# Patient Record
Sex: Male | Born: 1950
Health system: Southern US, Community
[De-identification: ages and names within clinical notes are randomized; demographics above are authoritative.]

## PROBLEM LIST (undated history)

## (undated) DIAGNOSIS — I499 Cardiac arrhythmia, unspecified: Secondary | ICD-10-CM

## (undated) DIAGNOSIS — E119 Type 2 diabetes mellitus without complications: Secondary | ICD-10-CM

## (undated) DIAGNOSIS — S91301A Unspecified open wound, right foot, initial encounter: Secondary | ICD-10-CM

## (undated) DIAGNOSIS — L02611 Cutaneous abscess of right foot: Secondary | ICD-10-CM

## (undated) DIAGNOSIS — I10 Essential (primary) hypertension: Secondary | ICD-10-CM

## (undated) HISTORY — DX: Cutaneous abscess of right foot: L02.611

## (undated) HISTORY — DX: Unspecified open wound, right foot, initial encounter: S91.301A

## (undated) HISTORY — PX: ROTATOR CUFF REPAIR: SHX139

---

## 2018-05-22 DIAGNOSIS — M79601 Pain in right arm: Secondary | ICD-10-CM

## 2018-05-22 HISTORY — DX: Pain in right arm: M79.601

## 2019-08-24 DIAGNOSIS — E119 Type 2 diabetes mellitus without complications: Secondary | ICD-10-CM

## 2019-08-24 DIAGNOSIS — I10 Essential (primary) hypertension: Secondary | ICD-10-CM

## 2019-08-24 DIAGNOSIS — I119 Hypertensive heart disease without heart failure: Secondary | ICD-10-CM | POA: Insufficient documentation

## 2019-08-24 DIAGNOSIS — I493 Ventricular premature depolarization: Secondary | ICD-10-CM

## 2019-08-24 HISTORY — DX: Hypertensive heart disease without heart failure: I11.9

## 2019-08-24 HISTORY — DX: Essential (primary) hypertension: I10

## 2019-08-24 HISTORY — DX: Type 2 diabetes mellitus without complications: E11.9

## 2019-08-24 HISTORY — DX: Ventricular premature depolarization: I49.3

## 2020-07-31 HISTORY — PX: FOOT SURGERY: SHX648

## 2020-08-07 ENCOUNTER — Telehealth: Payer: Self-pay

## 2020-08-07 ENCOUNTER — Other Ambulatory Visit: Payer: Self-pay

## 2020-08-07 ENCOUNTER — Emergency Department (INDEPENDENT_AMBULATORY_CARE_PROVIDER_SITE_OTHER): Payer: No Typology Code available for payment source

## 2020-08-07 ENCOUNTER — Emergency Department (INDEPENDENT_AMBULATORY_CARE_PROVIDER_SITE_OTHER)
Admission: RE | Admit: 2020-08-07 | Discharge: 2020-08-07 | Disposition: A | Discharge status: Home / Self Care | Payer: No Typology Code available for payment source | Source: Ambulatory Visit | Attending: Family Medicine | Admitting: Family Medicine

## 2020-08-07 VITALS — BP 121/76 | Temp 98.2°F | Resp 18

## 2020-08-07 DIAGNOSIS — S93134A Subluxation of interphalangeal joint of right lesser toe(s), initial encounter: Secondary | ICD-10-CM

## 2020-08-07 DIAGNOSIS — L03115 Cellulitis of right lower limb: Secondary | ICD-10-CM

## 2020-08-07 DIAGNOSIS — R2241 Localized swelling, mass and lump, right lower limb: Secondary | ICD-10-CM | POA: Diagnosis not present

## 2020-08-07 NOTE — ED Provider Notes (Signed)
[facility]    CSN: [id number] Arrival date & time: 08/07/20  0906      History   Chief Complaint Chief Complaint  Patient presents with  . Appointment    9am  . Foot Pain    RT    HPI Dean Rasmussen is a 69 y.o. male.   Patient complains of a long history of a subcutaneous nodule on the medial plantar surface of his right foot.  The lesion has gradually increased in size and become more painful during the past five months.  During the past 2 months he has had malodorous weeping from the surface of the lesion.  He keeps the lesion bandaged with application of bacitracin ointment.  He has recently moved to the area from [place].  He complains of increased pain as a result of moving boxes and furniture. He denies fevers, chills, and sweats.  He denies pain/swelling ascending to his right calf.  He has diabetes but is not sure of his most recent Hgb A1C.  He states that he is finishing a 3 week course of tapering dexamethasone for lower back pain.  The history is provided by the patient.  Foot Pain This is a chronic problem. Episode onset: Worse for five months. The problem occurs constantly. The problem has been gradually worsening. Pertinent negatives include no chest pain and no shortness of breath. The symptoms are aggravated by walking and standing. Nothing relieves the symptoms. Treatments tried: Application of bacitracin. The treatment provided no relief.    History reviewed. No pertinent past medical history.  Current problems:   Problem Noted Date  Essential hypertension 08/24/2019  Type 2 diabetes mellitus without complication, without long-term current use of insulin 08/24/2019  Ventricular ectopy 08/24/2019  Hypertensive left ventricular hypertrophy, without heart failure         History reviewed. No pertinent surgical history.     Home Medications    Prior to Admission medications   Medication Sig Start Date End Date Taking? Authorizing  Provider  glimepiride (AMARYL) 4 MG tablet Take by mouth. 10/14/17  Yes [provider]  levothyroxine (SYNTHROID) 25 MCG tablet Take by mouth. 10/10/17  Yes [provider]  metoprolol succinate (TOPROL-XL) 100 MG 24 hr tablet Take by mouth. 07/23/17  Yes [provider]  sitaGLIPtin (JANUVIA) 100 MG tablet Take by mouth. 10/04/17  Yes [provider]  aspirin 81 MG EC tablet Take by mouth.    [provider]    Family History Medical History Relation Name Comments  No Known Problems Father    Coronary artery disease Mother  stent  Coronary artery disease Sister  stent  Heart attack Sister     Relation Name Status Comments  Brother  Alive   Father  Deceased   Mother  Deceased   Sister        Social History Tobacco Use Types Packs/Day Years Used Date  Never Smoker      Smokeless Tobacco: Never Used      Alcohol Use Standard Drinks/Week Comments  Yes 0 (1 standard drink = 0.6 oz pure alcohol)        Allergies   Patient has no known allergies.   Review of Systems Review of Systems  Constitutional: Negative for activity change, appetite change, chills, diaphoresis, fatigue and fever.  HENT: Negative.   Eyes: Negative.   Respiratory: Negative for shortness of breath.   Cardiovascular: Negative for chest pain.  Gastrointestinal: Negative.   Genitourinary: Negative.  Musculoskeletal:       Right foot pain/swelling.  Skin: Positive for color change.  Neurological: Negative.   Hematological: Negative for adenopathy.     Physical Exam Triage Vital Signs ED Triage Vitals  Enc Vitals Group     BP 08/07/20 0918 121/76     Pulse --      Resp 08/07/20 0918 18     Temp 08/07/20 0918 98.2 F (36.8 C)     Temp Source 08/07/20 0918 Oral     SpO2 --      Weight --      Height --      Head Circumference --      Peak Flow --      Pain Score 08/07/20 0922 8     Pain Loc --      Pain Edu? --       Excl. in GC? --    No data found.  Updated Vital Signs BP 121/76 (BP Location: Right Arm)   Temp 98.2 F (36.8 C) (Oral)   Resp 18   Visual Acuity Right Eye Distance:   Left Eye Distance:   Bilateral Distance:    Right Eye Near:   Left Eye Near:    Bilateral Near:     Physical Exam Vitals and nursing note reviewed.  Constitutional:      General: He is not in acute distress. HENT:     Head: Normocephalic.     Mouth/Throat:     Mouth: Mucous membranes are moist.  Eyes:     Conjunctiva/sclera: Conjunctivae normal.     Pupils: Pupils are equal, round, and reactive to light.  Cardiovascular:     Rate and Rhythm: Normal rate and regular rhythm.     Heart sounds: Normal heart sounds.  Pulmonary:     Breath sounds: Normal breath sounds.  Abdominal:     Palpations: Abdomen is soft.     Tenderness: There is no abdominal tenderness.  Musculoskeletal:     Cervical back: Neck supple.     Right lower leg: No edema.     Left lower leg: No edema.       Feet:     Comments: Right medial foot, including plantar surface, has a large, firm mass, tender to palpation.  There is superficial weeping from excoriation on the plantar aspect of lesion.  There is erythema surrounding the lesion with tenderness to palpation but no induration or fluctuance. Cap refill is adequate.  Lymphadenopathy:     Cervical: No cervical adenopathy.  Skin:    General: Skin is warm and dry.  Neurological:     Mental Status: He is alert.      UC Treatments / Results  Labs (all labs ordered are listed, but only abnormal results are displayed) Labs Reviewed  WOUND CULTURE    EKG   Radiology DG Foot Complete Right  Result Date: 08/07/2020 CLINICAL DATA:  Chronic inflamed area along the medial foot with opened wound for 3 months, history of diabetes. EXAM: RIGHT FOOT COMPLETE - 3+ VIEW COMPARISON:  None FINDINGS: Extensive midfoot degenerative changes. Soft tissue swelling overlying the first  tarsometatarsal joint and medial cuneiform. Gas in the soft tissues. Marked irregularity of the base of the first metatarsal and medial cuneiform with shift of the first metatarsal laterally. Irregularity of intermediate and lateral cuneiform as well with some signs of sclerosis. Marked osteoarthritic change within the first MTP. Subluxation of the distal phalanx of the second toe. No sign of  acute fracture. IMPRESSION: 1. Soft tissue swelling and subcutaneous gas, compatible with soft tissue infection, potentially aggressive and likely related to reported ulceration. MRI may be helpful for further evaluation. 2. Midfoot degenerative changes with an appearance that likely is related to neuropathic process, presence of osteomyelitis particularly about the first metatarsal phalangeal joint is considered. 3. Subluxation of the second digit, distal phalanx may be chronic. Correlate with any acute pain in this area. These results will be called to the ordering clinician or representative by the Radiologist Assistant, and communication documented in the PACS or [employer]. Electronically Signed   By: Donzetta Kohut M.D.   On: 08/07/2020 10:51    Procedures Procedures (including critical care time)  Medications Ordered in UC Medications - No data to display  Initial Impression / Assessment and Plan / UC Course  I have reviewed the triage vital signs and the nursing notes.  Pertinent labs & imaging results that were available during my care of the patient were reviewed by me and considered in my medical decision making (see chart for details).    Concern for possible osteomyelitis in a chronically inflamed mass of patient's right foot plantar surface.  Will refer to podiatrist Dr. Ovid Curd for definitive management.   Final Clinical Impressions(s) / UC Diagnoses   Final diagnoses:  Mass of right foot  Cellulitis of right foot   Discharge Instructions   None    ED Prescriptions     None        Lattie Haw, MD 08/08/20 1525

## 2020-08-07 NOTE — ED Triage Notes (Signed)
Pt c/o foot pain x 2 mos. Wound in arch area that has started peeling and having a strong odor. Thought it was a plantars wart, size of half dollar. Bacitracin prn.

## 2020-08-07 NOTE — Telephone Encounter (Signed)
Was calling about referral to Dr Ardelle Anton. Advised pt that his info and imaging report has been faxed to Dr Ardelle Anton ofc and he should receive a call. Advised also trying to call his ofc to possibly schedule an appt asap.

## 2020-08-08 ENCOUNTER — Other Ambulatory Visit: Payer: Self-pay

## 2020-08-08 ENCOUNTER — Encounter (HOSPITAL_COMMUNITY): Payer: Self-pay | Admitting: Emergency Medicine

## 2020-08-08 ENCOUNTER — Inpatient Hospital Stay (HOSPITAL_COMMUNITY)
Admission: EM | Admit: 2020-08-08 | Discharge: 2020-08-16 | DRG: 628 | Disposition: A | Discharge status: Home / Self Care | Payer: No Typology Code available for payment source | Attending: Internal Medicine | Admitting: Internal Medicine

## 2020-08-08 ENCOUNTER — Ambulatory Visit (INDEPENDENT_AMBULATORY_CARE_PROVIDER_SITE_OTHER): Payer: No Typology Code available for payment source | Admitting: Podiatry

## 2020-08-08 DIAGNOSIS — Z9889 Other specified postprocedural states: Secondary | ICD-10-CM

## 2020-08-08 DIAGNOSIS — M869 Osteomyelitis, unspecified: Secondary | ICD-10-CM | POA: Diagnosis not present

## 2020-08-08 DIAGNOSIS — M1460 Charcot's joint, unspecified site: Secondary | ICD-10-CM | POA: Diagnosis present

## 2020-08-08 DIAGNOSIS — S91301A Unspecified open wound, right foot, initial encounter: Secondary | ICD-10-CM | POA: Diagnosis not present

## 2020-08-08 DIAGNOSIS — Z20822 Contact with and (suspected) exposure to covid-19: Secondary | ICD-10-CM | POA: Diagnosis present

## 2020-08-08 DIAGNOSIS — E1142 Type 2 diabetes mellitus with diabetic polyneuropathy: Secondary | ICD-10-CM | POA: Diagnosis present

## 2020-08-08 DIAGNOSIS — I119 Hypertensive heart disease without heart failure: Secondary | ICD-10-CM | POA: Diagnosis present

## 2020-08-08 DIAGNOSIS — Z7982 Long term (current) use of aspirin: Secondary | ICD-10-CM

## 2020-08-08 DIAGNOSIS — Z79890 Hormone replacement therapy (postmenopausal): Secondary | ICD-10-CM

## 2020-08-08 DIAGNOSIS — E11610 Type 2 diabetes mellitus with diabetic neuropathic arthropathy: Secondary | ICD-10-CM | POA: Diagnosis present

## 2020-08-08 DIAGNOSIS — L02619 Cutaneous abscess of unspecified foot: Secondary | ICD-10-CM | POA: Diagnosis not present

## 2020-08-08 DIAGNOSIS — Z79899 Other long term (current) drug therapy: Secondary | ICD-10-CM

## 2020-08-08 DIAGNOSIS — I4891 Unspecified atrial fibrillation: Secondary | ICD-10-CM | POA: Diagnosis not present

## 2020-08-08 DIAGNOSIS — I493 Ventricular premature depolarization: Secondary | ICD-10-CM | POA: Diagnosis present

## 2020-08-08 DIAGNOSIS — L03119 Cellulitis of unspecified part of limb: Secondary | ICD-10-CM

## 2020-08-08 DIAGNOSIS — E1140 Type 2 diabetes mellitus with diabetic neuropathy, unspecified: Secondary | ICD-10-CM | POA: Diagnosis present

## 2020-08-08 DIAGNOSIS — A480 Gas gangrene: Secondary | ICD-10-CM | POA: Diagnosis present

## 2020-08-08 DIAGNOSIS — Z452 Encounter for adjustment and management of vascular access device: Secondary | ICD-10-CM

## 2020-08-08 DIAGNOSIS — I10 Essential (primary) hypertension: Secondary | ICD-10-CM

## 2020-08-08 DIAGNOSIS — K5900 Constipation, unspecified: Secondary | ICD-10-CM | POA: Diagnosis not present

## 2020-08-08 DIAGNOSIS — M00071 Staphylococcal arthritis, right ankle and foot: Secondary | ICD-10-CM | POA: Diagnosis present

## 2020-08-08 DIAGNOSIS — L97519 Non-pressure chronic ulcer of other part of right foot with unspecified severity: Secondary | ICD-10-CM | POA: Diagnosis present

## 2020-08-08 DIAGNOSIS — D649 Anemia, unspecified: Secondary | ICD-10-CM | POA: Diagnosis present

## 2020-08-08 DIAGNOSIS — E11621 Type 2 diabetes mellitus with foot ulcer: Secondary | ICD-10-CM | POA: Diagnosis present

## 2020-08-08 DIAGNOSIS — E039 Hypothyroidism, unspecified: Secondary | ICD-10-CM | POA: Diagnosis present

## 2020-08-08 DIAGNOSIS — I4819 Other persistent atrial fibrillation: Secondary | ICD-10-CM | POA: Diagnosis not present

## 2020-08-08 DIAGNOSIS — E11628 Type 2 diabetes mellitus with other skin complications: Secondary | ICD-10-CM | POA: Diagnosis not present

## 2020-08-08 DIAGNOSIS — L97513 Non-pressure chronic ulcer of other part of right foot with necrosis of muscle: Secondary | ICD-10-CM | POA: Diagnosis not present

## 2020-08-08 DIAGNOSIS — E1152 Type 2 diabetes mellitus with diabetic peripheral angiopathy with gangrene: Secondary | ICD-10-CM | POA: Diagnosis present

## 2020-08-08 DIAGNOSIS — L02611 Cutaneous abscess of right foot: Secondary | ICD-10-CM | POA: Diagnosis present

## 2020-08-08 DIAGNOSIS — E1165 Type 2 diabetes mellitus with hyperglycemia: Secondary | ICD-10-CM | POA: Diagnosis present

## 2020-08-08 DIAGNOSIS — E119 Type 2 diabetes mellitus without complications: Secondary | ICD-10-CM

## 2020-08-08 DIAGNOSIS — B9561 Methicillin susceptible Staphylococcus aureus infection as the cause of diseases classified elsewhere: Secondary | ICD-10-CM | POA: Diagnosis present

## 2020-08-08 DIAGNOSIS — Z7984 Long term (current) use of oral hypoglycemic drugs: Secondary | ICD-10-CM

## 2020-08-08 DIAGNOSIS — E1149 Type 2 diabetes mellitus with other diabetic neurological complication: Secondary | ICD-10-CM

## 2020-08-08 HISTORY — DX: Essential (primary) hypertension: I10

## 2020-08-08 HISTORY — DX: Type 2 diabetes mellitus without complications: E11.9

## 2020-08-08 LAB — COMPREHENSIVE METABOLIC PANEL
ALT: 24 U/L (ref 0–44)
AST: 17 U/L (ref 15–41)
Albumin: 3.3 g/dL — ABNORMAL LOW (ref 3.5–5.0)
Alkaline Phosphatase: 58 U/L (ref 38–126)
Anion gap: 9 (ref 5–15)
BUN: 32 mg/dL — ABNORMAL HIGH (ref 8–23)
CO2: 26 mmol/L (ref 22–32)
Calcium: 8.9 mg/dL (ref 8.9–10.3)
Chloride: 101 mmol/L (ref 98–111)
Creatinine, Ser: 1.12 mg/dL (ref 0.61–1.24)
GFR calc Af Amer: >60 mL/min (ref >60)
GFR calc non Af Amer: >60 mL/min (ref >60)
Glucose, Bld: 301 mg/dL — ABNORMAL HIGH (ref 70–99)
Potassium: 4 mmol/L (ref 3.5–5.1)
Sodium: 136 mmol/L (ref 135–145)
Total Bilirubin: 0.9 mg/dL (ref 0.3–1.2)
Total Protein: 6.5 g/dL (ref 6.5–8.1)

## 2020-08-08 LAB — CBC WITH DIFFERENTIAL/PLATELET
Abs Immature Granulocytes: 0.19 10*3/uL — ABNORMAL HIGH (ref 0.00–0.07)
Basophils Absolute: 0 10*3/uL (ref 0.0–0.1)
Basophils Relative: 0 %
Eosinophils Absolute: 0 10*3/uL (ref 0.0–0.5)
Eosinophils Relative: 0 %
HCT: 36.8 % — ABNORMAL LOW (ref 39.0–52.0)
Hemoglobin: 12 g/dL — ABNORMAL LOW (ref 13.0–17.0)
Immature Granulocytes: 1 %
Lymphocytes Relative: 2 %
Lymphs Abs: 0.4 10*3/uL — ABNORMAL LOW (ref 0.7–4.0)
MCH: 30.8 pg (ref 26.0–34.0)
MCHC: 32.6 g/dL (ref 30.0–36.0)
MCV: 94.4 fL (ref 80.0–100.0)
Monocytes Absolute: 0.9 10*3/uL (ref 0.1–1.0)
Monocytes Relative: 4 %
Neutro Abs: 19.9 10*3/uL — ABNORMAL HIGH (ref 1.7–7.7)
Neutrophils Relative %: 93 %
Platelets: 240 10*3/uL (ref 150–400)
RBC: 3.9 MIL/uL — ABNORMAL LOW (ref 4.22–5.81)
RDW: 12.8 % (ref 11.5–15.5)
WBC: 21.4 10*3/uL — ABNORMAL HIGH (ref 4.0–10.5)
nRBC: 0 % (ref 0.0–0.2)

## 2020-08-08 LAB — LACTIC ACID, PLASMA: Lactic Acid, Venous: 1.2 mmol/L (ref 0.5–1.9)

## 2020-08-08 LAB — PROTIME-INR
INR: 1 (ref 0.8–1.2)
Prothrombin Time: 12.4 seconds (ref 11.4–15.2)

## 2020-08-08 NOTE — ED Triage Notes (Signed)
Pt reports he has a diabetic ulcer to his right foot x 2-3 mos. Seen yesterday at [facility] for same. Reports pain, swelling to the right foot and foul odor. Wound dressed prior to arrival.

## 2020-08-09 ENCOUNTER — Inpatient Hospital Stay (HOSPITAL_COMMUNITY): Payer: No Typology Code available for payment source

## 2020-08-09 ENCOUNTER — Encounter (HOSPITAL_COMMUNITY): Payer: Self-pay | Admitting: Internal Medicine

## 2020-08-09 ENCOUNTER — Inpatient Hospital Stay (HOSPITAL_COMMUNITY): Payer: No Typology Code available for payment source | Admitting: Certified Registered"

## 2020-08-09 ENCOUNTER — Emergency Department (HOSPITAL_COMMUNITY): Payer: No Typology Code available for payment source

## 2020-08-09 ENCOUNTER — Encounter (HOSPITAL_COMMUNITY)
Admission: EM | Disposition: A | Discharge status: Home / Self Care | Payer: Self-pay | Source: Home / Self Care | Attending: Internal Medicine

## 2020-08-09 DIAGNOSIS — Z9889 Other specified postprocedural states: Secondary | ICD-10-CM | POA: Diagnosis not present

## 2020-08-09 DIAGNOSIS — B9561 Methicillin susceptible Staphylococcus aureus infection as the cause of diseases classified elsewhere: Secondary | ICD-10-CM | POA: Diagnosis present

## 2020-08-09 DIAGNOSIS — A480 Gas gangrene: Secondary | ICD-10-CM | POA: Diagnosis present

## 2020-08-09 DIAGNOSIS — E119 Type 2 diabetes mellitus without complications: Secondary | ICD-10-CM | POA: Diagnosis not present

## 2020-08-09 DIAGNOSIS — M869 Osteomyelitis, unspecified: Secondary | ICD-10-CM | POA: Diagnosis present

## 2020-08-09 DIAGNOSIS — E11621 Type 2 diabetes mellitus with foot ulcer: Secondary | ICD-10-CM | POA: Diagnosis present

## 2020-08-09 DIAGNOSIS — E11610 Type 2 diabetes mellitus with diabetic neuropathic arthropathy: Secondary | ICD-10-CM | POA: Diagnosis present

## 2020-08-09 DIAGNOSIS — L02611 Cutaneous abscess of right foot: Secondary | ICD-10-CM | POA: Diagnosis present

## 2020-08-09 DIAGNOSIS — Z79899 Other long term (current) drug therapy: Secondary | ICD-10-CM | POA: Diagnosis not present

## 2020-08-09 DIAGNOSIS — M86171 Other acute osteomyelitis, right ankle and foot: Secondary | ICD-10-CM | POA: Diagnosis not present

## 2020-08-09 DIAGNOSIS — M00071 Staphylococcal arthritis, right ankle and foot: Secondary | ICD-10-CM | POA: Diagnosis present

## 2020-08-09 DIAGNOSIS — E1152 Type 2 diabetes mellitus with diabetic peripheral angiopathy with gangrene: Secondary | ICD-10-CM | POA: Diagnosis present

## 2020-08-09 DIAGNOSIS — L97519 Non-pressure chronic ulcer of other part of right foot with unspecified severity: Secondary | ICD-10-CM | POA: Diagnosis present

## 2020-08-09 DIAGNOSIS — S91301S Unspecified open wound, right foot, sequela: Secondary | ICD-10-CM | POA: Diagnosis not present

## 2020-08-09 DIAGNOSIS — E1165 Type 2 diabetes mellitus with hyperglycemia: Secondary | ICD-10-CM | POA: Diagnosis present

## 2020-08-09 DIAGNOSIS — I10 Essential (primary) hypertension: Secondary | ICD-10-CM | POA: Diagnosis not present

## 2020-08-09 DIAGNOSIS — Z79890 Hormone replacement therapy (postmenopausal): Secondary | ICD-10-CM | POA: Diagnosis not present

## 2020-08-09 DIAGNOSIS — K5900 Constipation, unspecified: Secondary | ICD-10-CM | POA: Diagnosis not present

## 2020-08-09 DIAGNOSIS — I4819 Other persistent atrial fibrillation: Secondary | ICD-10-CM | POA: Diagnosis not present

## 2020-08-09 DIAGNOSIS — Z7984 Long term (current) use of oral hypoglycemic drugs: Secondary | ICD-10-CM | POA: Diagnosis not present

## 2020-08-09 DIAGNOSIS — E039 Hypothyroidism, unspecified: Secondary | ICD-10-CM | POA: Diagnosis present

## 2020-08-09 DIAGNOSIS — I4891 Unspecified atrial fibrillation: Secondary | ICD-10-CM | POA: Diagnosis not present

## 2020-08-09 DIAGNOSIS — Z7982 Long term (current) use of aspirin: Secondary | ICD-10-CM | POA: Diagnosis not present

## 2020-08-09 DIAGNOSIS — I119 Hypertensive heart disease without heart failure: Secondary | ICD-10-CM | POA: Diagnosis present

## 2020-08-09 DIAGNOSIS — D649 Anemia, unspecified: Secondary | ICD-10-CM | POA: Diagnosis present

## 2020-08-09 DIAGNOSIS — E1142 Type 2 diabetes mellitus with diabetic polyneuropathy: Secondary | ICD-10-CM | POA: Diagnosis present

## 2020-08-09 DIAGNOSIS — Z20822 Contact with and (suspected) exposure to covid-19: Secondary | ICD-10-CM | POA: Diagnosis present

## 2020-08-09 DIAGNOSIS — E11628 Type 2 diabetes mellitus with other skin complications: Secondary | ICD-10-CM | POA: Diagnosis present

## 2020-08-09 HISTORY — PX: I & D EXTREMITY: SHX5045

## 2020-08-09 HISTORY — PX: IRRIGATION AND DEBRIDEMENT FOOT: SHX6602

## 2020-08-09 LAB — CBC
HCT: 42.2 % (ref 39.0–52.0)
HCT: 36.9 % — ABNORMAL LOW (ref 39.0–52.0)
Hemoglobin: 13.7 g/dL (ref 13.0–17.0)
Hemoglobin: 12.1 g/dL — ABNORMAL LOW (ref 13.0–17.0)
MCH: 31.1 pg (ref 26.0–34.0)
MCH: 30.9 pg (ref 26.0–34.0)
MCHC: 32.5 g/dL (ref 30.0–36.0)
MCHC: 32.8 g/dL (ref 30.0–36.0)
MCV: 95.9 fL (ref 80.0–100.0)
MCV: 94.4 fL (ref 80.0–100.0)
Platelets: 241 10*3/uL (ref 150–400)
Platelets: 211 10*3/uL (ref 150–400)
RBC: 4.4 MIL/uL (ref 4.22–5.81)
RBC: 3.91 MIL/uL — ABNORMAL LOW (ref 4.22–5.81)
RDW: 13 % (ref 11.5–15.5)
RDW: 12.9 % (ref 11.5–15.5)
WBC: 23.1 10*3/uL — ABNORMAL HIGH (ref 4.0–10.5)
WBC: 19.5 10*3/uL — ABNORMAL HIGH (ref 4.0–10.5)
nRBC: 0 % (ref 0.0–0.2)
nRBC: 0 % (ref 0.0–0.2)

## 2020-08-09 LAB — CBG MONITORING, ED
Glucose-Capillary: 254 mg/dL — ABNORMAL HIGH (ref 70–99)
Glucose-Capillary: 214 mg/dL — ABNORMAL HIGH (ref 70–99)

## 2020-08-09 LAB — SEDIMENTATION RATE: Sed Rate: 54 mm/hr — ABNORMAL HIGH (ref 0–16)

## 2020-08-09 LAB — HIV ANTIBODY (ROUTINE TESTING W REFLEX): HIV Screen 4th Generation wRfx: NONREACTIVE

## 2020-08-09 LAB — PROTIME-INR
INR: 1 (ref 0.8–1.2)
Prothrombin Time: 13.1 seconds (ref 11.4–15.2)

## 2020-08-09 LAB — GLUCOSE, CAPILLARY: Glucose-Capillary: 199 mg/dL — ABNORMAL HIGH (ref 70–99)

## 2020-08-09 LAB — HEMOGLOBIN A1C
Hgb A1c MFr Bld: 9 % — ABNORMAL HIGH (ref 4.8–5.6)
Mean Plasma Glucose: 211.6 mg/dL

## 2020-08-09 LAB — CREATININE, SERUM
Creatinine, Ser: 1.13 mg/dL (ref 0.61–1.24)
GFR calc Af Amer: >60 mL/min (ref >60)
GFR calc non Af Amer: >60 mL/min (ref >60)

## 2020-08-09 LAB — SARS CORONAVIRUS 2 BY RT PCR (HOSPITAL ORDER, PERFORMED IN ~~LOC~~ HOSPITAL LAB): SARS Coronavirus 2: NEGATIVE

## 2020-08-09 LAB — TSH: TSH: 0.892 u[IU]/mL (ref 0.350–4.500)

## 2020-08-09 SURGERY — IRRIGATION AND DEBRIDEMENT EXTREMITY
Anesthesia: Monitor Anesthesia Care | Site: Foot | Laterality: Right

## 2020-08-09 MED ORDER — ACETAMINOPHEN 160 MG/5ML PO SOLN: 325.0000 mg | Freq: Once | ORAL | Status: DC | PRN

## 2020-08-09 MED ORDER — LEVOTHYROXINE SODIUM 25 MCG PO TABS
25.0000 ug | ORAL_TABLET | Freq: Every day | ORAL | Status: DC
  Administered 2020-08-10 – 2020-08-16 (×7): 25 ug via ORAL
  Filled 2020-08-09 (×7): qty 1

## 2020-08-09 MED ORDER — CHLORHEXIDINE GLUCONATE 0.12 % MT SOLN
15.0000 mL | OROMUCOSAL | Status: AC
  Filled 2020-08-09: qty 15

## 2020-08-09 MED ORDER — ACETAMINOPHEN 650 MG RE SUPP: 650.0000 mg | Freq: Four times a day (QID) | RECTAL | Status: DC | PRN

## 2020-08-09 MED ORDER — MIDAZOLAM HCL 5 MG/5ML IJ SOLN
INTRAMUSCULAR | Status: DC | PRN
  Administered 2020-08-09: 2 mg via INTRAVENOUS

## 2020-08-09 MED ORDER — PIPERACILLIN-TAZOBACTAM 3.375 G IVPB
3.3750 g | Freq: Three times a day (TID) | INTRAVENOUS | Status: DC
  Administered 2020-08-10 – 2020-08-12 (×7): 3.375 g via INTRAVENOUS
  Filled 2020-08-09 (×5): qty 50

## 2020-08-09 MED ORDER — PROPOFOL 500 MG/50ML IV EMUL
INTRAVENOUS | Status: DC | PRN
  Administered 2020-08-09: 75 ug/kg/min via INTRAVENOUS

## 2020-08-09 MED ORDER — MEPERIDINE HCL 25 MG/ML IJ SOLN: 6.2500 mg | INTRAMUSCULAR | Status: DC | PRN

## 2020-08-09 MED ORDER — VANCOMYCIN HCL IN DEXTROSE 1-5 GM/200ML-% IV SOLN
1000.0000 mg | Freq: Two times a day (BID) | INTRAVENOUS | Status: DC
  Administered 2020-08-10 – 2020-08-13 (×7): 1000 mg via INTRAVENOUS
  Filled 2020-08-09 (×9): qty 200

## 2020-08-09 MED ORDER — ACETAMINOPHEN 10 MG/ML IV SOLN
1000.0000 mg | Freq: Once | INTRAVENOUS | Status: DC | PRN
  Administered 2020-08-09: 1000 mg via INTRAVENOUS

## 2020-08-09 MED ORDER — SODIUM CHLORIDE 0.9 % IV SOLN
2.0000 g | Freq: Once | INTRAVENOUS | Status: AC
  Administered 2020-08-09: 2 g via INTRAVENOUS
  Filled 2020-08-09: qty 20

## 2020-08-09 MED ORDER — BUPIVACAINE HCL (PF) 0.5 % IJ SOLN
INTRAMUSCULAR | Status: DC | PRN
  Administered 2020-08-09: 10 mL

## 2020-08-09 MED ORDER — LACTATED RINGERS IV SOLN: INTRAVENOUS | Status: DC

## 2020-08-09 MED ORDER — HYDROMORPHONE HCL 1 MG/ML IJ SOLN
0.2500 mg | INTRAMUSCULAR | Status: DC | PRN
  Administered 2020-08-09 (×2): 0.5 mg via INTRAVENOUS

## 2020-08-09 MED ORDER — ACETAMINOPHEN 325 MG PO TABS: 325.0000 mg | ORAL_TABLET | Freq: Once | ORAL | Status: DC | PRN

## 2020-08-09 MED ORDER — SODIUM CHLORIDE 0.9% FLUSH
3.0000 mL | Freq: Two times a day (BID) | INTRAVENOUS | Status: DC
  Administered 2020-08-09 – 2020-08-15 (×6): 3 mL via INTRAVENOUS

## 2020-08-09 MED ORDER — MORPHINE SULFATE (PF) 2 MG/ML IV SOLN: 2.0000 mg | INTRAVENOUS | Status: DC | PRN

## 2020-08-09 MED ORDER — MIDAZOLAM HCL 2 MG/2ML IJ SOLN
INTRAMUSCULAR | Status: AC
  Filled 2020-08-09: qty 2

## 2020-08-09 MED ORDER — ACETAMINOPHEN 10 MG/ML IV SOLN
INTRAVENOUS | Status: AC
  Filled 2020-08-09: qty 100

## 2020-08-09 MED ORDER — SODIUM CHLORIDE 0.45 % IV SOLN: INTRAVENOUS | Status: DC

## 2020-08-09 MED ORDER — AMISULPRIDE (ANTIEMETIC) 5 MG/2ML IV SOLN: 10.0000 mg | Freq: Once | INTRAVENOUS | Status: DC | PRN

## 2020-08-09 MED ORDER — INSULIN ASPART 100 UNIT/ML ~~LOC~~ SOLN
0.0000 [IU] | Freq: Three times a day (TID) | SUBCUTANEOUS | Status: DC
  Administered 2020-08-10 (×3): 5 [IU] via SUBCUTANEOUS
  Administered 2020-08-11: 3 [IU] via SUBCUTANEOUS
  Administered 2020-08-11: 5 [IU] via SUBCUTANEOUS
  Administered 2020-08-11: 8 [IU] via SUBCUTANEOUS
  Administered 2020-08-12: 3 [IU] via SUBCUTANEOUS
  Administered 2020-08-12 – 2020-08-13 (×2): 8 [IU] via SUBCUTANEOUS
  Administered 2020-08-13: 5 [IU] via SUBCUTANEOUS
  Administered 2020-08-13 – 2020-08-14 (×2): 3 [IU] via SUBCUTANEOUS
  Administered 2020-08-14: 11 [IU] via SUBCUTANEOUS
  Administered 2020-08-14: 5 [IU] via SUBCUTANEOUS
  Administered 2020-08-15: 2 [IU] via SUBCUTANEOUS
  Administered 2020-08-15: 3 [IU] via SUBCUTANEOUS
  Administered 2020-08-15: 2 [IU] via SUBCUTANEOUS
  Administered 2020-08-16: 8 [IU] via SUBCUTANEOUS

## 2020-08-09 MED ORDER — ONDANSETRON HCL 4 MG/2ML IJ SOLN
INTRAMUSCULAR | Status: AC
  Filled 2020-08-09: qty 2

## 2020-08-09 MED ORDER — SODIUM CHLORIDE 0.9% FLUSH: 3.0000 mL | INTRAVENOUS | Status: DC | PRN

## 2020-08-09 MED ORDER — SODIUM CHLORIDE 0.9 % IV SOLN: 2.0000 g | INTRAVENOUS | Status: DC

## 2020-08-09 MED ORDER — LIDOCAINE HCL 2 % IJ SOLN
INTRAMUSCULAR | Status: AC
  Filled 2020-08-09: qty 20

## 2020-08-09 MED ORDER — VANCOMYCIN HCL 1000 MG IV SOLR
INTRAVENOUS | Status: AC
  Filled 2020-08-09: qty 1000

## 2020-08-09 MED ORDER — ONDANSETRON HCL 4 MG/2ML IJ SOLN
INTRAMUSCULAR | Status: DC | PRN
  Administered 2020-08-09: 4 mg via INTRAVENOUS

## 2020-08-09 MED ORDER — METRONIDAZOLE IN NACL 5-0.79 MG/ML-% IV SOLN: 500.0000 mg | Freq: Three times a day (TID) | INTRAVENOUS | Status: DC

## 2020-08-09 MED ORDER — FENTANYL CITRATE (PF) 100 MCG/2ML IJ SOLN
INTRAMUSCULAR | Status: DC | PRN
Start: 2020-08-09 — End: 2020-08-09
  Administered 2020-08-09: 50 ug via INTRAVENOUS

## 2020-08-09 MED ORDER — BISACODYL 10 MG RE SUPP: 10.0000 mg | Freq: Every day | RECTAL | Status: DC | PRN

## 2020-08-09 MED ORDER — HYDROMORPHONE HCL 1 MG/ML IJ SOLN
INTRAMUSCULAR | Status: AC
  Filled 2020-08-09: qty 1

## 2020-08-09 MED ORDER — METRONIDAZOLE IN NACL 5-0.79 MG/ML-% IV SOLN
500.0000 mg | Freq: Once | INTRAVENOUS | Status: AC
  Administered 2020-08-09: 500 mg via INTRAVENOUS
  Filled 2020-08-09: qty 100

## 2020-08-09 MED ORDER — VANCOMYCIN HCL 1500 MG/300ML IV SOLN
1500.0000 mg | INTRAVENOUS | Status: AC
  Administered 2020-08-09: 1500 mg via INTRAVENOUS
  Filled 2020-08-09: qty 300

## 2020-08-09 MED ORDER — FENTANYL CITRATE (PF) 250 MCG/5ML IJ SOLN
INTRAMUSCULAR | Status: AC
  Filled 2020-08-09: qty 5

## 2020-08-09 MED ORDER — POLYETHYLENE GLYCOL 3350 17 G PO PACK: 17.0000 g | PACK | Freq: Every day | ORAL | Status: DC | PRN

## 2020-08-09 MED ORDER — HYDROCODONE-ACETAMINOPHEN 5-325 MG PO TABS
1.0000 | ORAL_TABLET | ORAL | Status: DC | PRN
  Administered 2020-08-10 – 2020-08-14 (×5): 2 via ORAL
  Administered 2020-08-15: 1 via ORAL
  Filled 2020-08-09 (×6): qty 2
  Filled 2020-08-09 (×2): qty 1

## 2020-08-09 MED ORDER — SODIUM CHLORIDE 0.9 % IV SOLN
250.0000 mL | INTRAVENOUS | Status: DC | PRN
  Administered 2020-08-09: 250 mL via INTRAVENOUS

## 2020-08-09 MED ORDER — PIPERACILLIN-TAZOBACTAM 3.375 G IVPB 30 MIN
3.3750 g | Freq: Once | INTRAVENOUS | Status: DC
  Filled 2020-08-09: qty 50

## 2020-08-09 MED ORDER — METOPROLOL SUCCINATE ER 50 MG PO TB24
100.0000 mg | ORAL_TABLET | Freq: Every day | ORAL | Status: DC
  Administered 2020-08-10 – 2020-08-16 (×7): 100 mg via ORAL
  Filled 2020-08-09 (×7): qty 2

## 2020-08-09 MED ORDER — ACETAMINOPHEN 325 MG PO TABS
650.0000 mg | ORAL_TABLET | Freq: Four times a day (QID) | ORAL | Status: DC | PRN
  Administered 2020-08-09 – 2020-08-16 (×4): 650 mg via ORAL
  Filled 2020-08-09 (×4): qty 2

## 2020-08-09 MED ORDER — IBUPROFEN 400 MG PO TABS: 600.0000 mg | ORAL_TABLET | Freq: Four times a day (QID) | ORAL | Status: DC | PRN

## 2020-08-09 MED ORDER — BUPIVACAINE HCL (PF) 0.5 % IJ SOLN
INTRAMUSCULAR | Status: AC
  Filled 2020-08-09: qty 30

## 2020-08-09 MED ORDER — LIDOCAINE HCL 2 % IJ SOLN
INTRAMUSCULAR | Status: DC | PRN
  Administered 2020-08-09: 10 mL

## 2020-08-09 MED ORDER — ENOXAPARIN SODIUM 40 MG/0.4ML ~~LOC~~ SOLN
40.0000 mg | SUBCUTANEOUS | Status: DC
  Administered 2020-08-09 – 2020-08-13 (×4): 40 mg via SUBCUTANEOUS
  Filled 2020-08-09 (×4): qty 0.4

## 2020-08-09 MED ORDER — BUPIVACAINE HCL 0.5 % IJ SOLN
INTRAMUSCULAR | Status: AC
  Filled 2020-08-09: qty 1

## 2020-08-09 SURGICAL SUPPLY — 50 items
BLADE AVERAGE 25MMX9MM (BLADE) ×1
BLADE AVERAGE 25X9 (BLADE) ×2 IMPLANT
BNDG ELASTIC 4X5.8 VLCR STR LF (GAUZE/BANDAGES/DRESSINGS) ×3 IMPLANT
BNDG ESMARK 4X9 LF (GAUZE/BANDAGES/DRESSINGS) IMPLANT
BNDG GAUZE ELAST 4 BULKY (GAUZE/BANDAGES/DRESSINGS) ×3 IMPLANT
BONE CEMENT GENTAMICIN (Cement) ×3 IMPLANT
CEMENT BONE GENTAMICIN 40 (Cement) ×1 IMPLANT
CHLORAPREP W/TINT 26 (MISCELLANEOUS) ×3 IMPLANT
COVER SURGICAL LIGHT HANDLE (MISCELLANEOUS) ×3 IMPLANT
COVER WAND RF STERILE (DRAPES) ×3 IMPLANT
CUFF TOURN SGL QUICK 18X4 (TOURNIQUET CUFF) IMPLANT
CUFF TOURN SGL QUICK 34 (TOURNIQUET CUFF)
CUFF TRNQT CYL 34X4.125X (TOURNIQUET CUFF) IMPLANT
DECANTER SPIKE VIAL GLASS SM (MISCELLANEOUS) ×6 IMPLANT
DRAPE U-SHAPE 47X51 STRL (DRAPES) ×3 IMPLANT
ELECT CAUTERY BLADE 6.4 (BLADE) ×3 IMPLANT
ELECT REM PT RETURN 9FT ADLT (ELECTROSURGICAL) ×3
ELECTRODE REM PT RTRN 9FT ADLT (ELECTROSURGICAL) ×1 IMPLANT
GAUZE SPONGE 4X4 12PLY STRL (GAUZE/BANDAGES/DRESSINGS) ×3 IMPLANT
GLOVE BIO SURGEON STRL SZ7.5 (GLOVE) ×3 IMPLANT
GLOVE BIOGEL M 7.0 STRL (GLOVE) ×3 IMPLANT
GLOVE BIOGEL PI IND STRL 8 (GLOVE) ×1 IMPLANT
GLOVE BIOGEL PI INDICATOR 8 (GLOVE) ×2
GOWN STRL REUS W/ TWL LRG LVL3 (GOWN DISPOSABLE) ×1 IMPLANT
GOWN STRL REUS W/ TWL XL LVL3 (GOWN DISPOSABLE) ×1 IMPLANT
GOWN STRL REUS W/TWL LRG LVL3 (GOWN DISPOSABLE) ×2
GOWN STRL REUS W/TWL XL LVL3 (GOWN DISPOSABLE) ×2
KIT BASIN OR (CUSTOM PROCEDURE TRAY) ×3 IMPLANT
KIT TURNOVER KIT B (KITS) ×3 IMPLANT
MANIFOLD NEPTUNE II (INSTRUMENTS) ×3 IMPLANT
NEEDLE BIOPSY JAMSHIDI 8X6 (NEEDLE) IMPLANT
NEEDLE HYPO 25GX1X1/2 BEV (NEEDLE) IMPLANT
NS IRRIG 1000ML POUR BTL (IV SOLUTION) ×3 IMPLANT
PACK ORTHO EXTREMITY (CUSTOM PROCEDURE TRAY) ×3 IMPLANT
PAD ABD 8X10 STRL (GAUZE/BANDAGES/DRESSINGS) ×3 IMPLANT
PAD ARMBOARD 7.5X6 YLW CONV (MISCELLANEOUS) ×6 IMPLANT
PROBE DEBRIDE SONICVAC MISONIX (TIP) IMPLANT
SET CYSTO W/LG BORE CLAMP LF (SET/KITS/TRAYS/PACK) ×3 IMPLANT
SOL PREP POV-IOD 4OZ 10% (MISCELLANEOUS) ×6 IMPLANT
SUT PROLENE 0 CT 1 30 (SUTURE) ×3 IMPLANT
SUT PROLENE 2 TP 1 (SUTURE) ×3 IMPLANT
SWAB COLLECTION DEVICE MRSA (MISCELLANEOUS) ×3 IMPLANT
SWAB CULTURE ESWAB REG 1ML (MISCELLANEOUS) ×3 IMPLANT
SYR CONTROL 10ML LL (SYRINGE) IMPLANT
TOWEL GREEN STERILE (TOWEL DISPOSABLE) ×3 IMPLANT
TOWEL GREEN STERILE FF (TOWEL DISPOSABLE) ×3 IMPLANT
TOWER CARTRIDGE SMART MIX (DISPOSABLE) ×3 IMPLANT
TUBE CONNECTING 12'X1/4 (SUCTIONS) ×1
TUBE CONNECTING 12X1/4 (SUCTIONS) ×2 IMPLANT
YANKAUER SUCT BULB TIP NO VENT (SUCTIONS) ×3 IMPLANT

## 2020-08-09 NOTE — Transfer of Care (Signed)
Immediate Anesthesia Transfer of Care Note  Patient: Dean Rasmussen  Procedure(s) Performed: IRRIGATION AND DEBRIDEMENT OF FOOT WITH IMPLANTATION OF ANTIBIOTIC CEMENT BEADS (Right Foot)  Patient Location: PACU  Anesthesia Type:MAC  Level of Consciousness: drowsy and patient cooperative  Airway & Oxygen Therapy: Patient Spontanous Breathing and Patient connected to nasal cannula oxygen  Post-op Assessment: Report given to RN, Post -op Vital signs reviewed and stable and Patient moving all extremities  Post vital signs: Reviewed and stable  Last Vitals:  Vitals Value Taken Time  BP    Temp    Pulse 89 08/09/20 1906  Resp 23 08/09/20 1906  SpO2 100 % 08/09/20 1906  Vitals shown include unvalidated device data.  Last Pain:  Vitals:   08/09/20 1153  TempSrc: Oral  PainSc:       Patients Stated Pain Goal: 3 (58/85/02 7741)  Complications: No complications documented.

## 2020-08-09 NOTE — Plan of Care (Signed)

## 2020-08-09 NOTE — Anesthesia Postprocedure Evaluation (Signed)
Anesthesia Post Note  Patient: Dean Rasmussen  Procedure(s) Performed: IRRIGATION AND DEBRIDEMENT OF FOOT WITH IMPLANTATION OF ANTIBIOTIC CEMENT BEADS (Right Foot)     Patient location during evaluation: PACU Anesthesia Type: MAC Level of consciousness: awake and alert Pain management: pain level controlled Vital Signs Assessment: post-procedure vital signs reviewed and stable Respiratory status: spontaneous breathing, nonlabored ventilation, respiratory function stable and patient connected to nasal cannula oxygen Cardiovascular status: stable and blood pressure returned to baseline Postop Assessment: no apparent nausea or vomiting Anesthetic complications: no   No complications documented.  Last Vitals:  Vitals:   08/09/20 2010 08/09/20 2023  BP: (!) 136/92 (!) 132/94  Pulse: 76 95  Resp: (!) 21 17  Temp: 36.6 C 36.6 C  SpO2: 100% 100%    Last Pain:  Vitals:   08/09/20 2023  TempSrc: Oral  PainSc:                  Tiajuana Amass

## 2020-08-09 NOTE — Progress Notes (Signed)
Pharmacy Antibiotic Note  Dean Rasmussen is a 69 y.o. male admitted on 08/08/2020 with R-foot osteo.  Pharmacy has been consulted for Vancomycin dosing along with Rocephin + Flagyl per MD.  SCr 1.12, CrCl~60-65 ml/min.   Plan: - Vancomycin 1500 mg IV x 1 followed by 1g IV every 12 hours - Will continue to follow renal function, culture results, LOT, and antibiotic de-escalation plans      Temp (24hrs), Avg:99.3 F (37.4 C), Min:98.4 F (36.9 C), Max:100.2 F (37.9 C)  Recent Labs  Lab 08/08/20 2055 08/09/20 0919  WBC 21.4* 23.1*  CREATININE 1.12  --   LATICACIDVEN 1.2  --     CrCl cannot be calculated (Unknown ideal weight.).    No Known Allergies  Antimicrobials this admission: Vanc 9/10 >> Rocephin 9/10 >> Flagyl 9/10 >>  Dose adjustments this admission: n/a  Microbiology results: 9/9 BCx >> 9/9 COVID >>   Thank you for allowing pharmacy to be a part of this patient's care.  Georgina Pillion, PharmD, BCPS Clinical Pharmacist Clinical phone for 08/09/2020: [phone] 08/09/2020 12:30 PM   **Pharmacist phone directory can now be found on [email or web address] ([email or web address]).  Listed under [facility].

## 2020-08-09 NOTE — Consult Note (Addendum)
Reason for Consult: right foot infection and abscess Referring Physician: Evelena Leyden, PA-C  Dean Rasmussen is an 69 y.o. male.  HPI: 69 year old male that was seen at [facility] on 08/07/20 and referred to [facility] on 08/08/20 by Dr Ardelle Anton. Dr Ardelle Anton diagnosed him with a R foot infection and referred him to the [facility] ED for evaluation and admission. MRI was completed which revealed extensive medial and dorsal foot abscess as well as bony changes concerning for osteomyelitis. Overall he feels OK, and better since starting antibiotics.  Past Medical History:  Diagnosis Date  . Diabetes mellitus without complication (HCC)     History reviewed. No pertinent surgical history.  History reviewed. No pertinent family history.  Social History:  has no history on file for tobacco use, alcohol use, and drug use.  Allergies: No Known Allergies  Medications: I have reviewed the patient's current medications.  Results for orders placed or performed during the hospital encounter of 08/08/20 (from the past 48 hour(s))  Culture, blood (Routine x 2)     Status: None (Preliminary result)   Collection Time: 08/08/20  8:50 PM   Specimen: BLOOD RIGHT ARM  Result Value Ref Range   Specimen Description BLOOD RIGHT ARM    Special Requests      BOTTLES DRAWN AEROBIC AND ANAEROBIC Blood Culture adequate volume   Culture      NO GROWTH < 12 HOURS Performed at [facility], [address], [place], [place] [address]    Report Status PENDING   Comprehensive metabolic panel     Status: Abnormal   Collection Time: 08/08/20  8:55 PM  Result Value Ref Range   Sodium 136 135 - 145 mmol/L   Potassium 4.0 3.5 - 5.1 mmol/L   Chloride 101 98 - 111 mmol/L   CO2 26 22 - 32 mmol/L   Glucose, Bld 301 (H) 70 - 99 mg/dL    Comment: Glucose reference range applies only to samples taken after fasting for at least 8 hours.   BUN 32 (H) 8 - 23 mg/dL   Creatinine, Ser  0.96 0.61 - 1.24 mg/dL   Calcium 8.9 8.9 - 28.3 mg/dL   Total Protein 6.5 6.5 - 8.1 g/dL   Albumin 3.3 (L) 3.5 - 5.0 g/dL   AST 17 15 - 41 U/L   ALT 24 0 - 44 U/L   Alkaline Phosphatase 58 38 - 126 U/L   Total Bilirubin 0.9 0.3 - 1.2 mg/dL   GFR calc non Af Amer >60 >60 mL/min   GFR calc Af Amer >60 >60 mL/min   Anion gap 9 5 - 15    Comment: Performed at [facility], [address], [place], [place] [address]  Lactic acid, plasma     Status: None   Collection Time: 08/08/20  8:55 PM  Result Value Ref Range   Lactic Acid, Venous 1.2 0.5 - 1.9 mmol/L    Comment: Performed at [facility], [address], [place], [place] [address]  CBC with Differential     Status: Abnormal   Collection Time: 08/08/20  8:55 PM  Result Value Ref Range   WBC 21.4 (H) 4.0 - 10.5 K/uL   RBC 3.90 (L) 4.22 - 5.81 MIL/uL   Hemoglobin 12.0 (L) 13.0 - 17.0 g/dL   HCT 50.3 (L) 39 - 52 %   MCV 94.4 80.0 - 100.0 fL   MCH 30.8  26.0 - 34.0 pg   MCHC 32.6 30.0 - 36.0 g/dL   RDW 44.8 18.5 - 63.1 %   Platelets 240 150 - 400 K/uL   nRBC 0.0 0.0 - 0.2 %   Neutrophils Relative % 93 %   Neutro Abs 19.9 (H) 1.7 - 7.7 K/uL   Lymphocytes Relative 2 %   Lymphs Abs 0.4 (L) 0.7 - 4.0 K/uL   Monocytes Relative 4 %   Monocytes Absolute 0.9 0 - 1 K/uL   Eosinophils Relative 0 %   Eosinophils Absolute 0.0 0 - 0 K/uL   Basophils Relative 0 %   Basophils Absolute 0.0 0 - 0 K/uL   Immature Granulocytes 1 %   Abs Immature Granulocytes 0.19 (H) 0.00 - 0.07 K/uL    Comment: Performed at [facility], [address], [place], [place] [address]  Protime-INR     Status: None   Collection Time: 08/08/20  8:55 PM  Result Value Ref Range   Prothrombin Time 12.4 11.4 - 15.2 seconds   INR 1.0 0.8 - 1.2    Comment: (NOTE) INR goal varies based on device and disease states. Performed at [facility], [address], [place], [place] [address]   Culture, blood (Routine x 2)     Status: None (Preliminary  result)   Collection Time: 08/08/20  8:56 PM   Specimen: BLOOD RIGHT HAND  Result Value Ref Range   Specimen Description BLOOD RIGHT HAND    Special Requests      BOTTLES DRAWN AEROBIC ONLY Blood Culture results may not be optimal due to an inadequate volume of blood received in culture bottles   Culture      NO GROWTH < 12 HOURS Performed at [facility], [address], [place], [place] [address]    Report Status PENDING   Sedimentation rate     Status: Abnormal   Collection Time: 08/09/20  8:52 AM  Result Value Ref Range   Sed Rate 54 (H) 0 - 16 mm/hr    Comment: Performed at [facility], [address] [place], [place] [address]  Protime-INR     Status: None   Collection Time: 08/09/20  9:19 AM  Result Value Ref Range   Prothrombin Time 13.1 11.4 - 15.2 seconds   INR 1.0 0.8 - 1.2    Comment: (NOTE) INR goal varies based on device and disease states. Performed at [facility], [address] [place], [place] [address]   CBC     Status: Abnormal   Collection Time: 08/09/20  9:19 AM  Result Value Ref Range   WBC 23.1 (H) 4.0 - 10.5 K/uL   RBC 4.40 4.22 - 5.81 MIL/uL   Hemoglobin 13.7 13.0 - 17.0 g/dL   HCT 67.2 39 - 52 %   MCV 95.9 80.0 - 100.0 fL   MCH 31.1 26.0 - 34.0 pg   MCHC 32.5 30.0 - 36.0 g/dL   RDW 09.4 70.9 - 62.8 %   Platelets 241 150 - 400 K/uL   nRBC 0.0 0.0 - 0.2 %    Comment: Performed at [facility], [address] [place], [place] [address]  POC CBG, ED     Status: Abnormal   Collection Time: 08/09/20  9:31 AM  Result Value Ref Range   Glucose-Capillary 254 (H) 70 - 99 mg/dL    Comment: Glucose reference range applies only to samples taken after fasting for at  least 8 hours.  SARS Coronavirus 2 by RT PCR (hospital order, performed in [facility] hospital lab) Nasopharyngeal Nasopharyngeal Swab     Status: None   Collection Time: 08/09/20 11:56 AM   Specimen: Nasopharyngeal Swab  Result Value Ref Range   SARS Coronavirus  2 NEGATIVE NEGATIVE    Comment: (NOTE) SARS-CoV-2 target nucleic acids are NOT DETECTED.  The SARS-CoV-2 RNA is generally detectable in upper and lower respiratory specimens during the acute phase of infection. The lowest concentration of SARS-CoV-2 viral copies this assay can detect is 250 copies / mL. A negative result does not preclude SARS-CoV-2 infection and should not be used as the sole basis for treatment or other patient management decisions.  A negative result may occur with improper specimen collection / handling, submission of specimen other than nasopharyngeal swab, presence of viral mutation(s) within the areas targeted by this assay, and inadequate number of viral copies (<250 copies / mL). A negative result must be combined with clinical observations, patient history, and epidemiological information.  Fact Sheet for Patients:   BoilerBrush.com.cyhttps://www.fda.gov/media/136312/download  Fact Sheet for Healthcare Providers: https://pope.com/https://www.fda.gov/media/136313/download  This test is not yet approved or  cleared by the [place] FDA and has been authorized for detection and/or diagnosis of SARS-CoV-2 by FDA under an Emergency Use Authorization (EUA).  This EUA will remain in effect (meaning this test can be used) for the duration of the COVID-19 declaration under Section 564(b)(1) of the Act, 21 U.S.C. section 360bbb-3(b)(1), unless the authorization is terminated or revoked sooner.  Performed at [facility], [address] [place], [place] [address]   TSH     Status: None   Collection Time: 08/09/20  1:01 PM  Result Value Ref Range   TSH 0.892 0.350 - 4.500 uIU/mL    Comment: Performed by a 3rd Generation assay with a functional sensitivity of <=0.01 uIU/mL. Performed at [facility], [address] [place], [place] [address]   HIV Antibody (routine testing w rflx)     Status: None   Collection Time: 08/09/20  1:50 PM  Result Value Ref Range   HIV Screen 4th  Generation wRfx Non Reactive Non Reactive    Comment: Performed at [facility], [address] [place], [place] [address]  CBC     Status: Abnormal   Collection Time: 08/09/20  1:50 PM  Result Value Ref Range   WBC 19.5 (H) 4.0 - 10.5 K/uL   RBC 3.91 (L) 4.22 - 5.81 MIL/uL   Hemoglobin 12.1 (L) 13.0 - 17.0 g/dL   HCT 91.436.9 (L) 39 - 52 %   MCV 94.4 80.0 - 100.0 fL   MCH 30.9 26.0 - 34.0 pg   MCHC 32.8 30.0 - 36.0 g/dL   RDW 78.212.9 95.611.5 - 21.315.5 %   Platelets 211 150 - 400 K/uL   nRBC 0.0 0.0 - 0.2 %    Comment: Performed at [facility], [address] [place], [place] [address]  Creatinine, serum     Status: None   Collection Time: 08/09/20  1:50 PM  Result Value Ref Range   Creatinine, Ser 1.13 0.61 - 1.24 mg/dL   GFR calc non Af Amer >60 >60 mL/min   GFR calc Af Amer >60 >60 mL/min    Comment: Performed at [facility], [address] [place], [place] [address]  Hemoglobin A1c     Status: Abnormal   Collection Time: 08/09/20  2:00 PM  Result Value Ref Range   Hgb A1c MFr Bld 9.0 (H) 4.8 - 5.6 %    Comment: (NOTE) Pre diabetes:          5.7%-6.4%  Diabetes:              >6.4%  Glycemic control for   <7.0% adults with diabetes    Mean Plasma Glucose 211.6 mg/dL    Comment: Performed at [facility], [address], [place], [place] [address]  CBG monitoring, ED     Status: Abnormal   Collection Time: 08/09/20  4:15 PM  Result Value Ref Range   Glucose-Capillary 214 (H) 70 - 99 mg/dL    Comment: Glucose reference range applies only to samples taken after fasting for at least 8 hours.  Glucose, capillary     Status: Abnormal   Collection Time: 08/09/20  7:07 PM  Result Value Ref Range   Glucose-Capillary 199 (H) 70 - 99 mg/dL    Comment: Glucose reference range applies only to samples taken after fasting for at least 8 hours.    MR FOOT RIGHT WO CONTRAST  Result Date: 08/09/2020 CLINICAL DATA:  Draining right foot wound.  Diabetes.   Leukocytosis. EXAM: MRI OF THE RIGHT FOREFOOT WITHOUT CONTRAST TECHNIQUE: Multiplanar, multisequence MR imaging of the right forefoot was performed. No intravenous contrast was administered. COMPARISON:  X-ray 08/07/2020 FINDINGS: There is a focal soft tissue ulceration at the plantar aspect of the right foot underlying the level of the first tarsometatarsal joint with sinus track extending into the plantar aspect of the first TMT joint (series 7, images 23-24). There is marked associated soft tissue edema and skin thickening at the plantar medial aspect of the foot. Along the medial and dorsal aspect of the foot, there is a large fluid and air containing collection measuring approximately 6.7 x 1.4 x 3.3 cm (series 8, image 25; series 7, images 21-32). There is bone marrow edema with loss of cortical margin and low T1 marrow signal along the plantar aspect of the first metatarsal base at the level of the previously described soft tissue ulceration (series 3 and 8, images 22). There is also subtle marrow edema within the plantar aspect of the adjacent medial cuneiform. There are advanced degenerative changes throughout the midfoot, particularly involving the first and second TMT joints, naviculocuneiform, and intercuneiform joints with associated subchondral marrow signal changes. Findings may reflect a neuropathic joint. Severe arthropathy at the first MTP joint with complete joint space loss and subchondral cystic changes along both sides of joint. No acute fracture. No dislocation. Fatty atrophy of the intrinsic foot musculature with diffuse edema-like signal suggesting denervation changes and/or myositis. Grossly intact tendinous structures without tenosynovial fluid collection. IMPRESSION: 1. Focal soft tissue ulceration at the plantar aspect of the right foot underlying the level of the first tarsometatarsal joint with sinus track extending into the plantar aspect of the first TMT joint. Subtle signal changes  within the plantar aspects of the first metatarsal base and medial cuneiform compatible with acute osteomyelitis. 2. Suspect septic arthritis of the first TMT joint. 3. Large fluid and air containing collection along the medial and dorsal aspect of the midfoot measuring up to 6.7 cm, consistent with abscess. 4. Advanced degenerative changes throughout the midfoot with associated subchondral marrow signal changes. Findings may reflect a neuropathic joint. Signal changes at this level related to an infectious arthropathy would be difficult to exclude. 5. Fatty atrophy of the intrinsic foot musculature with diffuse edema-like  signal suggesting denervation changes and/or myositis. Electronically Signed   By: Duanne Guess D.O.   On: 08/09/2020 10:24    Review of Systems  Constitutional: Positive for malaise/fatigue. Negative for chills and fever.  Respiratory: Negative.   Cardiovascular: Negative.   Gastrointestinal: Negative.   Genitourinary: Negative.   Musculoskeletal: Positive for joint pain.  Skin:       Wound R foot   Neurological: Negative.    Blood pressure (!) 132/94, pulse 95, temperature 97.9 F (36.6 C), temperature source Oral, resp. rate 17, height 5\' 10"  (1.778 m), weight 81.6 kg, SpO2 100 %.  Vitals:   08/09/20 2005 08/09/20 2023  BP:  (!) 132/94  Pulse: 87 95  Resp: 15 17  Temp:  97.9 F (36.6 C)  SpO2: 98% 100%    General AA&O x3. Normal mood and affect.  Vascular Dorsalis pedis and posterior tibial pulses  present 2+ right  Capillary refill normal to all digits. Pedal hair growth normal.  Neurologic Epicritic sensation grossly absent.  Dermatologic (Wound) Wound Location: Rt. foot medial Wound Measurement: 1cm  Wound Base: draining pus, small necrotic area Peri-wound: Reddened Exudate: purulent  Orthopedic: Motor intact BLE.        Assessment/Plan:   -Imaging: Studies independently reviewed -Antibiotics: on vanc/flagyl/zosyn -WB Status: NWB  RLE -Wound Care: TBD pending OR -Surgical Plan: to OR urgently today for extensive I&D and washout  10/09/20 08/09/2020, 4:21 PM   Best available via secure chat for questions or concerns.

## 2020-08-09 NOTE — ED Provider Notes (Signed)
[facility] EMERGENCY DEPARTMENT Provider Note   CSN: [id number] Arrival date & time: 08/08/20  2003     History Chief Complaint  Patient presents with  . Diabetic Ulcer    Dean Rasmussen is a 69 y.o. male with PMH of HTN type II DM who presents to the ED with a 13-month history of right foot ulceration.  I reviewed patient's medical record and he was evaluated at an urgent care on 08/07/2020 for same complaints.  Patient was noted to have recently completed a 3-week course of tapered dexamethasone for his low back pain.  Plain films obtained demonstrate soft tissue swelling with subcutaneous gas compatible with soft infection.  Recommended MRI for further evaluation as osteomyelitis cannot be excluded.  Patient was then evaluated by Dr. Ardelle Anton DPM who advised him to go to the ED for admission with IV antibiotics.  We will plan for surgical intervention with incision and drainage, possibly including bone biopsy.  Dr. Sharl Ma DPM who is on-call has also been made aware of the patient.  On my examination, patient states that he first noticed a small lesion that he thought was perhaps a plantar wart 4 months ago and that it has since been getting progressively worse.  He states that he has been hiding it from his wife who is an Charity fundraiser.  He is endorsing pain symptoms involving the area on his foot.  He states that the swelling, redness, and drainage has been getting progressively worse.  He states that he has thrown away twelve pairs of socks due to the malodorous drainage.  He was Metformin controlled DM, but has been taking insulin recently in the context of prescribed steroids for his low back pain with sciatica.  Patient denies any fevers or chills at home.  His wife is antivaccination so he has not yet been immunized for COVID-19.  HPI     Past Medical History:  Diagnosis Date  . Diabetes mellitus without complication [facility])     Patient Active Problem List   Diagnosis  Date Noted  . Essential hypertension 08/24/2019  . Hypertensive left ventricular hypertrophy, without heart failure 08/24/2019  . Type 2 diabetes mellitus without complication, without long-term current use of insulin (HCC) 08/24/2019  . Ventricular ectopy 08/24/2019    No past surgical history on file.     No family history on file.  Social History   Tobacco Use  . Smoking status: Not on file  Substance Use Topics  . Alcohol use: Not on file  . Drug use: Not on file    Home Medications Prior to Admission medications   Medication Sig Start Date End Date Taking? Authorizing Provider  aspirin 81 MG EC tablet Take by mouth.    [provider]  dexamethasone (DECADRON) 2 MG tablet Take by mouth. 07/23/20   [provider]  glimepiride (AMARYL) 4 MG tablet Take by mouth. 10/14/17   [provider]  levothyroxine (SYNTHROID) 25 MCG tablet Take by mouth. 10/10/17   [provider]  metFORMIN (GLUCOPHAGE) 1000 MG tablet Take by mouth. 10/18/17   [provider]  metoprolol succinate (TOPROL-XL) 100 MG 24 hr tablet Take by mouth. 07/23/17   [provider]  minocycline (MINOCIN) 100 MG capsule Take by mouth. 03/16/18   [provider]  sitaGLIPtin (JANUVIA) 100 MG tablet Take by mouth. 10/04/17   [provider]  traMADol (ULTRAM) 50 MG tablet TAKE 1 TABLET BY MOUTH EVERY 4 TO 6 HOURS AS  NEEDED FOR PAIN 07/23/20   [provider]    Allergies    Patient has no known allergies.  Review of Systems   Review of Systems  All other systems reviewed and are negative.   Physical Exam Updated Vital Signs BP (!) 133/91 (BP Location: Right Arm)   Pulse 98   Temp 98.4 F (36.9 C) (Oral)   Resp (!) 22   SpO2 93%   Physical Exam Vitals and nursing note reviewed. Exam conducted with a chaperone present.  Constitutional:      Appearance: He is ill-appearing.  HENT:     Head: Normocephalic and atraumatic.    Eyes:     General: No scleral icterus.    Conjunctiva/sclera: Conjunctivae normal.  Cardiovascular:     Rate and Rhythm: Normal rate and regular rhythm.     Pulses: Normal pulses.     Heart sounds: Normal heart sounds.  Pulmonary:     Effort: Pulmonary effort is normal. No respiratory distress.     Breath sounds: Normal breath sounds.  Musculoskeletal:     Cervical back: Normal range of motion.     Comments: Right foot: Significant swelling, tenderness, warmth, and erythema involving the medial aspect of right foot, originating from plantar region.  Actively draining foul-smelling purulent material from ulcer on plantar aspect.  Pedal pulse intact and capillary refill less than 2 seconds.  Sensation intact throughout.  Able to wiggle the toes.   Right ankle: Can plantar flex and dorsiflex, albeit limited due to swelling.    Skin:    General: Skin is dry.  Neurological:     Mental Status: He is alert.     GCS: GCS eye subscore is 4. GCS verbal subscore is 5. GCS motor subscore is 6.  Psychiatric:        Mood and Affect: Mood normal.        Behavior: Behavior normal.        Thought Content: Thought content normal.         ED Results / Procedures / Treatments   Labs (all labs ordered are listed, but only abnormal results are displayed) Labs Reviewed  COMPREHENSIVE METABOLIC PANEL - Abnormal; Notable for the following components:      Result Value   Glucose, Bld 301 (*)    BUN 32 (*)    Albumin 3.3 (*)    All other components within normal limits  CBC WITH DIFFERENTIAL/PLATELET - Abnormal; Notable for the following components:   WBC 21.4 (*)    RBC 3.90 (*)    Hemoglobin 12.0 (*)    HCT 36.8 (*)    Neutro Abs 19.9 (*)    Lymphs Abs 0.4 (*)    Abs Immature Granulocytes 0.19 (*)    All other components within normal limits  CBC - Abnormal; Notable for the following components:   WBC 23.1 (*)    All other components within normal limits  SEDIMENTATION RATE - Abnormal;  Notable for the following components:   Sed Rate 54 (*)    All other components within normal limits  CBG MONITORING, ED - Abnormal; Notable for the following components:   Glucose-Capillary 254 (*)    All other components within normal limits  CULTURE, BLOOD (ROUTINE X 2)  CULTURE, BLOOD (ROUTINE X 2)  SARS CORONAVIRUS 2 BY RT PCR (HOSPITAL ORDER, PERFORMED IN [facility])  LACTIC ACID, PLASMA  PROTIME-INR  PROTIME-INR  LACTIC ACID, PLASMA    EKG None  Radiology  MR FOOT RIGHT WO CONTRAST  Result Date: 08/09/2020 CLINICAL DATA:  Draining right foot wound.  Diabetes.  Leukocytosis. EXAM: MRI OF THE RIGHT FOREFOOT WITHOUT CONTRAST TECHNIQUE: Multiplanar, multisequence MR imaging of the right forefoot was performed. No intravenous contrast was administered. COMPARISON:  X-ray 08/07/2020 FINDINGS: There is a focal soft tissue ulceration at the plantar aspect of the right foot underlying the level of the first tarsometatarsal joint with sinus track extending into the plantar aspect of the first TMT joint (series 7, images 23-24). There is marked associated soft tissue edema and skin thickening at the plantar medial aspect of the foot. Along the medial and dorsal aspect of the foot, there is a large fluid and air containing collection measuring approximately 6.7 x 1.4 x 3.3 cm (series 8, image 25; series 7, images 21-32). There is bone marrow edema with loss of cortical margin and low T1 marrow signal along the plantar aspect of the first metatarsal base at the level of the previously described soft tissue ulceration (series 3 and 8, images 22). There is also subtle marrow edema within the plantar aspect of the adjacent medial cuneiform. There are advanced degenerative changes throughout the midfoot, particularly involving the first and second TMT joints, naviculocuneiform, and intercuneiform joints with associated subchondral marrow signal changes. Findings may reflect a neuropathic  joint. Severe arthropathy at the first MTP joint with complete joint space loss and subchondral cystic changes along both sides of joint. No acute fracture. No dislocation. Fatty atrophy of the intrinsic foot musculature with diffuse edema-like signal suggesting denervation changes and/or myositis. Grossly intact tendinous structures without tenosynovial fluid collection. IMPRESSION: 1. Focal soft tissue ulceration at the plantar aspect of the right foot underlying the level of the first tarsometatarsal joint with sinus track extending into the plantar aspect of the first TMT joint. Subtle signal changes within the plantar aspects of the first metatarsal base and medial cuneiform compatible with acute osteomyelitis. 2. Suspect septic arthritis of the first TMT joint. 3. Large fluid and air containing collection along the medial and dorsal aspect of the midfoot measuring up to 6.7 cm, consistent with abscess. 4. Advanced degenerative changes throughout the midfoot with associated subchondral marrow signal changes. Findings may reflect a neuropathic joint. Signal changes at this level related to an infectious arthropathy would be difficult to exclude. 5. Fatty atrophy of the intrinsic foot musculature with diffuse edema-like signal suggesting denervation changes and/or myositis. Electronically Signed   By: Duanne Guess D.O.   On: 08/09/2020 10:24    Procedures Procedures (including critical care time)  Medications Ordered in ED Medications  0.45 % sodium chloride infusion (has no administration in time range)  ibuprofen (ADVIL) tablet 600 mg (has no administration in time range)  cefTRIAXone (ROCEPHIN) 2 g in sodium chloride 0.9 % 100 mL IVPB (has no administration in time range)  metroNIDAZOLE (FLAGYL) IVPB 500 mg (has no administration in time range)    ED Course  I have reviewed the triage vital signs and the nursing notes.  Pertinent labs & imaging results that were available during my care of  the patient were reviewed by me and considered in my medical decision making (see chart for details).  Clinical Course as of Aug 09 1120  Fri Aug 09, 2020  0919 I spoke with Clarita Crane, office manager for Dr. Ardelle Anton and Dr. Lilian Kapur, who spoke with hospitalist asking for preadmission orders.  However, they are asking that we consult with hospitalist for admission for IV antibiotics and  that they will then round on patient today once admitted and determine surgical plan.      [GG]  1045 Spoke with Dr. Ardelle AntonWagoner.  Will start him on broad-spectrum antibiotics and keep him n.p.o. as Dr. Lilian KapurMcDonald will aim to take him to the OR later today.   [GG]  1115 Spoke with Dr. Luberta Robertsonhatterjee who will see and admit patient.  She recommended addition of vancomycin to cover for gram positive.     [GG]    Clinical Course User Index [GG] Lorelee NewGreen, Elizah Mierzwa L, PA-C   MDM Rules/Calculators/A&P                          Patient presents to the ED with an infection of his right foot with concern for osteomyelitis.  Obtaining MRI imaging and will start on IV antibiotics.   I spoke with Clarita CraneKeely, office manager for Dr. Ardelle AntonWagoner and Dr. Lilian KapurMcDonald at [facility], who had been contacted for preadmission orders.  However, she states that they are not directly admitting and instead are requesting that we consult with hospitalist for admission for IV antibiotics and that they will then round on patient today once admitted and subsequently determine surgical plan.     Labs Lactic acid: 1.2. Blood cultures: No growth. CBG: 254. CMP: Mild hyperglycemia, otherwise unremarkable. PT/INR: WNL. CBC with differential: Leukocytosis to 21.4.  Mild anemia with hemoglobin 12.0.  No recent labs with which to compare.  Leukocytosis at least partially attributable to steroid taper. Sed rate: Pending. COVID-19 testing: Pending.  Patient is type II DM.  Given n.p.o. status, will continue to check CBG regularly.  I reached out to on-call [facility] Dr. Lilian KapurMcDonald D.P.M. to clarify plans for surgical intervention and patient n.p.o. status.  Once I have spoken to them, will consult hospitalist for admission.  I am holding off on starting patient patient on IV ceftriaxone and metronidazole until I speak with Dr. Lilian KapurMcDonald or Dr. Ardelle AntonWagoner in case they would like to obtain bone biopsy/culture prior to initiation of antibiotics.  Ibuprofen 600 mg q6 hours for pain.  0.45% sodium chloride infusion at 75 cc/h.  MRIs personally reviewed and demonstrates subtle signal changes within plantar aspects of the 1st metatarsal base and medial cuneiform compatible with acute osteomyelitis.  There is also suspected septic arthritis of the 1st TMT joint.  There is a large fluid and air-containing collection along the medial and dorsal aspect of the midfoot measuring up to 6.7 cm, consistent with abscess.  Spoke with Dr. Ardelle AntonWagoner.  Will start him on broad-spectrum antibiotics and keep him n.p.o. as Dr. Lilian KapurMcDonald will aim to take him to the OR later today.  Spoke with Dr. Luberta Robertsonhatterjee who will see and admit patient.  She recommended addition of vancomycin to cover for gram positive.     Final Clinical Impression(s) / ED Diagnoses Final diagnoses:  Osteomyelitis of right foot, unspecified type (HCC)  Abscess of right foot    Rx / DC Orders ED Discharge Orders    None       Lorelee NewGreen, Fama Muenchow L, PA-C 08/09/20 1121    Sabino DonovanKatz, Eric C, MD 08/09/20 1202

## 2020-08-09 NOTE — ED Notes (Signed)
Patient transported to MRI 

## 2020-08-09 NOTE — H&P (Signed)
History and Physical:    Dean Rasmussen   WUJ:[id number] DOB: 1951-02-15 DOA: 08/08/2020  Referring MD/provider: PA Chilton Si PCP: Patient, [name]   Patient coming from: Home  Chief Complaint: Diabetic foot infection  History of Present Illness:   Dean Rasmussen is an 69 y.o. male with PMH significant for poorly controlled DM 2, HTN who moved here last week from [place] was in his usual state of health until 2 to 3 months ago when he noted that a soft tissue swelling he has had all his life started to change.  It developed what sounds like a callus and then "a white area".  He treated it with coverage with Band-Aids and bacitracin ointment.  His wife is a Engineer, civil (consulting) however he did not show it to her.  3 weeks ago he was seen by his doctor for lower back pain and prescribed Decadron 16-day taper.  Shortly after starting the taper patient noted redness and swelling in his midfoot which progressed significantly and quickly.  3 days ago patient showed to his wife who was alarmed by what she saw and convinced him to see a podiatrist.  Podiatrist did an MRI which showed large abscess with possible acute osteomyelitis and possible septic arthritis of the first toe.  Patient is now admitted for surgical debridement and antibiotic treatment of complicated diabetic foot infection with osteomyelitis and possible septic arthritis.  Patient states he doesn't know what his hemoglobin A1c is but states "my doctor shots at me all the time" thinks that it may be around 8.  Notes "I didn't use to pay attention to it but I been very good in the past couple of months".  Patient denies any chest pain shortness of breath abdominal pain fevers chills or malaise.  Denies history of CAD or CVD.  Denies history of CHF.  ED Course:  The patient was discussed with Dr. Alyce Pagan who will be taking over his care.  Dr. Alyce Pagan is planning on take him to the OR today.  He was given ceftriaxone, Flagyl and vancomycin in the  ED.  ROS:   ROS   Review of Systems: Per HPI  Past Medical History:   Past Medical History:  Diagnosis Date  . Diabetes mellitus without complication [facility])     Past Surgical History:   No past surgical history on file.  Social History:   Social History   Socioeconomic History  . Marital status: Married    Spouse name: Not on file  . Number of children: Not on file  . Years of education: Not on file  . Highest education level: Not on file  Occupational History  . Not on file  Tobacco Use  . Smoking status: Not on file  Substance and Sexual Activity  . Alcohol use: Not on file  . Drug use: Not on file  . Sexual activity: Not on file  Other Topics Concern  . Not on file  Social History Narrative  . Not on file   Social Determinants of Health   Financial Resource Strain:   . Difficulty of Paying Living Expenses: Not on file  Food Insecurity:   . Worried About Programme researcher, broadcasting/film/video in the Last Year: Not on file  . Ran Out of Food in the Last Year: Not on file  Transportation Needs:   . Lack of Transportation (Medical): Not on file  . Lack of Transportation (Non-Medical): Not on file  Physical Activity:   . Days of  Exercise per Week: Not on file  . Minutes of Exercise per Session: Not on file  Stress:   . Feeling of Stress : Not on file  Social Connections:   . Frequency of Communication with Friends and Family: Not on file  . Frequency of Social Gatherings with Friends and Family: Not on file  . Attends Religious Services: Not on file  . Active Member of Clubs or Organizations: Not on file  . Attends Banker Meetings: Not on file  . Marital Status: Not on file  Intimate Partner Violence:   . Fear of Current or Ex-Partner: Not on file  . Emotionally Abused: Not on file  . Physically Abused: Not on file  . Sexually Abused: Not on file    Allergies   Patient has no known allergies.  Family history:   No family history on  file.  Current Medications:   Prior to Admission medications   Medication Sig Start Date End Date Taking? Authorizing Provider  aspirin 81 MG EC tablet Take by mouth.    [provider]  dexamethasone (DECADRON) 2 MG tablet Take by mouth. 07/23/20   [provider]  glimepiride (AMARYL) 4 MG tablet Take by mouth. 10/14/17   [provider]  levothyroxine (SYNTHROID) 25 MCG tablet Take by mouth. 10/10/17   [provider]  metFORMIN (GLUCOPHAGE) 1000 MG tablet Take by mouth. 10/18/17   [provider]  metoprolol succinate (TOPROL-XL) 100 MG 24 hr tablet Take by mouth. 07/23/17   [provider]  minocycline (MINOCIN) 100 MG capsule Take by mouth. 03/16/18   [provider]  sitaGLIPtin (JANUVIA) 100 MG tablet Take by mouth. 10/04/17   [provider]  traMADol (ULTRAM) 50 MG tablet TAKE 1 TABLET BY MOUTH EVERY 4 TO 6 HOURS AS NEEDED FOR PAIN 07/23/20   [provider]    Physical Exam:   Vitals:   08/09/20 0843 08/09/20 0930 08/09/20 1128 08/09/20 1153  BP: 137/81 (!) 133/91  (!) 121/93  Pulse: 94 98  89  Resp: (!) 22 (!) 22  16  Temp: 98.4 F (36.9 C)   98.1 F (36.7 C)  TempSrc: Oral   Oral  SpO2: 100% 93%  98%  Weight:   81.6 kg   Height:   5\' 10"  (1.778 m)      Physical Exam: Blood pressure (!) 121/93, pulse 89, temperature 98.1 F (36.7 C), temperature source Oral, resp. rate 16, height 5\' 10"  (1.778 m), weight 81.6 kg, SpO2 98 %. Gen: Somewhat pale, talkative man lying in bed with right leg with evident complicated infection. Eyes: sclera anicteric, conjuctiva mildly injected bilaterally CVS: S1-S2, regulary, no gallops Respiratory:  decreased air entry likely secondary to decreased inspiratory effort GI: NABS, soft, NT  LE: Right midfoot has 10 cm soft tissue swelling on medial aspect with surrounding erythema and fluctuance.  There is an eschar where it may have decompressed. neuro:grossly  nonfocal.  Psych: patient is logical and coherent, judgement and he appears anxious   Data Review:    Labs: Basic Metabolic Panel: Recent Labs  Lab 08/08/20 2055  NA 136  K 4.0  CL 101  CO2 26  GLUCOSE 301*  BUN 32*  CREATININE 1.12  CALCIUM 8.9   Liver Function Tests: Recent Labs  Lab 08/08/20 2055  AST 17  ALT 24  ALKPHOS 58  BILITOT 0.9  PROT 6.5  ALBUMIN 3.3*   No results for input(s): LIPASE, AMYLASE in the last  168 hours. No results for input(s): AMMONIA in the last 168 hours. CBC: Recent Labs  Lab 08/08/20 2055 08/09/20 0919  WBC 21.4* 23.1*  NEUTROABS 19.9*  --   HGB 12.0* 13.7  HCT 36.8* 42.2  MCV 94.4 95.9  PLT 240 241   Cardiac Enzymes: No results for input(s): CKTOTAL, CKMB, CKMBINDEX, TROPONINI in the last 168 hours.  BNP (last 3 results) No results for input(s): PROBNP in the last 8760 hours. CBG: Recent Labs  Lab 08/09/20 0931  GLUCAP 254*    Urinalysis No results found for: COLORURINE, APPEARANCEUR, LABSPEC, PHURINE, GLUCOSEU, HGBUR, BILIRUBINUR, KETONESUR, PROTEINUR, UROBILINOGEN, NITRITE, LEUKOCYTESUR    Radiographic Studies: MR FOOT RIGHT WO CONTRAST  Result Date: 08/09/2020 CLINICAL DATA:  Draining right foot wound.  Diabetes.  Leukocytosis. EXAM: MRI OF THE RIGHT FOREFOOT WITHOUT CONTRAST TECHNIQUE: Multiplanar, multisequence MR imaging of the right forefoot was performed. No intravenous contrast was administered. COMPARISON:  X-ray 08/07/2020 FINDINGS: There is a focal soft tissue ulceration at the plantar aspect of the right foot underlying the level of the first tarsometatarsal joint with sinus track extending into the plantar aspect of the first TMT joint (series 7, images 23-24). There is marked associated soft tissue edema and skin thickening at the plantar medial aspect of the foot. Along the medial and dorsal aspect of the foot, there is a large fluid and air containing collection measuring approximately 6.7 x 1.4 x 3.3 cm  (series 8, image 25; series 7, images 21-32). There is bone marrow edema with loss of cortical margin and low T1 marrow signal along the plantar aspect of the first metatarsal base at the level of the previously described soft tissue ulceration (series 3 and 8, images 22). There is also subtle marrow edema within the plantar aspect of the adjacent medial cuneiform. There are advanced degenerative changes throughout the midfoot, particularly involving the first and second TMT joints, naviculocuneiform, and intercuneiform joints with associated subchondral marrow signal changes. Findings may reflect a neuropathic joint. Severe arthropathy at the first MTP joint with complete joint space loss and subchondral cystic changes along both sides of joint. No acute fracture. No dislocation. Fatty atrophy of the intrinsic foot musculature with diffuse edema-like signal suggesting denervation changes and/or myositis. Grossly intact tendinous structures without tenosynovial fluid collection. IMPRESSION: 1. Focal soft tissue ulceration at the plantar aspect of the right foot underlying the level of the first tarsometatarsal joint with sinus track extending into the plantar aspect of the first TMT joint. Subtle signal changes within the plantar aspects of the first metatarsal base and medial cuneiform compatible with acute osteomyelitis. 2. Suspect septic arthritis of the first TMT joint. 3. Large fluid and air containing collection along the medial and dorsal aspect of the midfoot measuring up to 6.7 cm, consistent with abscess. 4. Advanced degenerative changes throughout the midfoot with associated subchondral marrow signal changes. Findings may reflect a neuropathic joint. Signal changes at this level related to an infectious arthropathy would be difficult to exclude. 5. Fatty atrophy of the intrinsic foot musculature with diffuse edema-like signal suggesting denervation changes and/or myositis. Electronically Signed   By:  Duanne GuessNicholas  Plundo D.O.   On: 08/09/2020 10:24    EKG: Ordered and pending   Assessment/Plan:   Principal Problem:   Osteomyelitis (HCC) Active Problems:   Essential hypertension   Hypertensive left ventricular hypertrophy, without heart failure   Type 2 diabetes mellitus without complication, without long-term current use of insulin (HCC)   Ventricular ectopy  69 year old  with poorly controlled DM presents with complicated diabetic foot infection with abscess, cellulitis, acute osteomyelitis and septic arthritis.  Diabetic foot infection Patient to undergo OR debridement per Dr. Alyce Pagan Will continue patient on vancomycin and Zosyn Stop steroids  DM2  Patient will need tight control Hold Januvia, Metformin and glimepiride Start patient on moderate dose SSI AC at bedtime, will need to titrate up as warranted  HTN Continue Toprol-XL 100    Other information:   DVT prophylaxis: Lovenox ordered. Code Status: Full Family Communication: Patient states his wife is fully aware of plan Disposition Plan: Home Consults called: Podiatry Admission status: Inpatient  Cleo Santucci Tublu Judeth Gilles [facility]  If 7PM-7AM, please contact night-coverage [email or web address] Password [facility] 08/09/2020, 12:56 PM

## 2020-08-09 NOTE — Progress Notes (Signed)
Subjective:   Patient ID: Dean Rasmussen, male   DOB: 69 y.o.   MRN: [id number]   HPI 69 year old male presents the office today with his daughter for concerns of infection of the right foot.  He was seen in urgent care yesterday was sent home on oral antibiotics.  Patient states that he has had a wound, and fraction for the last couple of months.  X-rays yesterday confirm aggressive soft tissue infection and concern for osteomyelitis.  Active draining abscesses noted on exam today.  Overall feels well denies any fevers or chills.   Review of Systems  All other systems reviewed and are negative.  Past Medical History:  Diagnosis Date   Diabetes mellitus without complication (HCC)     No past surgical history on file.   Current Outpatient Medications:    dexamethasone (DECADRON) 2 MG tablet, Take by mouth., Disp: , Rfl:    metFORMIN (GLUCOPHAGE) 1000 MG tablet, Take by mouth., Disp: , Rfl:    minocycline (MINOCIN) 100 MG capsule, Take by mouth., Disp: , Rfl:    traMADol (ULTRAM) 50 MG tablet, TAKE 1 TABLET BY MOUTH EVERY 4 TO 6 HOURS AS NEEDED FOR PAIN, Disp: , Rfl:    aspirin 81 MG EC tablet, Take by mouth., Disp: , Rfl:    glimepiride (AMARYL) 4 MG tablet, Take by mouth., Disp: , Rfl:    levothyroxine (SYNTHROID) 25 MCG tablet, Take by mouth., Disp: , Rfl:    metoprolol succinate (TOPROL-XL) 100 MG 24 hr tablet, Take by mouth., Disp: , Rfl:    sitaGLIPtin (JANUVIA) 100 MG tablet, Take by mouth., Disp: , Rfl:   No Known Allergies      Objective:  Physical Exam  General: AAO x3, NAD  Dermatological: There is a draining abscess noted along the plantar medial aspect of right foot and there is fluctuation crepitation noted on the dorsal midfoot with edema and erythema to the medial foot.  There is edema to the ankle.  Warmth of the foot.  Vascular: Dorsalis Pedis artery and Posterior Tibial artery pedal pulses are 2/4 bilateral with immedate capillary fill time.  There is  no pain with calf compression, swelling, warmth, erythema.   Neruologic: Sensation decreased with Semmes Weinstein monofilament  Musculoskeletal: Mild tenderness on the wound muscular strength 5/5 in all groups tested bilateral.  Gait: Unassisted, Nonantalgic.       Assessment:   Ulceration, abscess right foot     Plan:  -Treatment options discussed including all alternatives, risks, and complications -Etiology of symptoms were discussed -I did review the x-rays from the urgent care yesterday.  Is able to probe the wound there is an active draining abscess and concern for soft tissue edema.  Given the infection I recommend him to the emergency room for further treatment and likely mission of the hospital and IV Antibiotics.  Needs to Have the MRI Obtained, the Arterial Studies.  We Will Plan for Possible Surgical Intervention Tomorrow for Incision and Drainage, Possible Bone Biopsy.  I Discussed the Case with Dr. Lilian Kapur Who Also Saw the Patient Today and Is On-Call for the Hospital. We will follow upon admission.   *We contacted the ER triage nurse to inform them of his arrival.   Vivi Barrack [facility]

## 2020-08-09 NOTE — Brief Op Note (Signed)
08/09/2020  7:02 PM  PATIENT:  Dean Rasmussen  69 y.o. male  PRE-OPERATIVE DIAGNOSIS:  foot infection right foot  POST-OPERATIVE DIAGNOSIS:  foot infection right foot  PROCEDURE:  Procedure(s): IRRIGATION AND DEBRIDEMENT OF FOOT WITH IMPLANTATION OF ANTIBIOTIC CEMENT BEADS (Right)  SURGEON:  Surgeon(s) and Role:    * Adira Limburg, Rachelle Hora, DPM - Primary  PHYSICIAN ASSISTANT:   ASSISTANTS: none   ANESTHESIA:   local and IV sedation  EBL:  100 mL   BLOOD ADMINISTERED:none  DRAINS: none   LOCAL MEDICATIONS USED:  BUPIVICAINE , LIDOCAINE  50/50 mix plain and Amount: 20 ml  SPECIMEN:  Right foot abscess wound culture for microbiology, 1st metatarsal bone biopsy for histology, 1st metatarsal bone culture  DISPOSITION OF SPECIMEN:  as above  COUNTS:  YES  TOURNIQUET:   Total Tourniquet Time Documented: Calf (Right) - 21 minutes Total: Calf (Right) - 21 minutes   DICTATION: .Note written in EPIC  PLAN OF CARE: Admit to inpatient   PATIENT DISPOSITION:  PACU - hemodynamically stable.   Delay start of Pharmacological VTE agent (>24hrs) due to surgical blood loss or risk of bleeding: yes

## 2020-08-09 NOTE — Anesthesia Procedure Notes (Signed)
Procedure Name: MAC Date/Time: 08/09/2020 6:00 PM Performed by: Moshe Salisbury, CRNA Pre-anesthesia Checklist: Patient identified, Emergency Drugs available, Suction available, Patient being monitored and Timeout performed Patient Re-evaluated:Patient Re-evaluated prior to induction Oxygen Delivery Method: Nasal cannula Placement Confirmation: positive ETCO2 Dental Injury: Teeth and Oropharynx as per pre-operative assessment

## 2020-08-09 NOTE — Anesthesia Preprocedure Evaluation (Signed)
Anesthesia Evaluation  Patient identified by MRN, date of birth, ID band Patient awake    Reviewed: Allergy & Precautions, NPO status , Patient's Chart, lab work & pertinent test results  Airway Mallampati: I  TM Distance: >3 FB Neck ROM: Full    Dental  (+) Teeth Intact, Dental Advisory Given   Pulmonary neg pulmonary ROS,    breath sounds clear to auscultation       Cardiovascular hypertension, Pt. on home beta blockers  Rhythm:Regular Rate:Normal     Neuro/Psych negative neurological ROS  negative psych ROS   GI/Hepatic negative GI ROS, Neg liver ROS,   Endo/Other  diabetes, Type 2, Oral Hypoglycemic AgentsHypothyroidism   Renal/GU Renal InsufficiencyRenal disease     Musculoskeletal negative musculoskeletal ROS (+)   Abdominal Normal abdominal exam  (+)   Peds  Hematology negative hematology ROS (+)   Anesthesia Other Findings   Reproductive/Obstetrics                             Anesthesia Physical Anesthesia Plan  ASA: II  Anesthesia Plan: MAC   Post-op Pain Management:    Induction: Intravenous  PONV Risk Score and Plan: 2 and Ondansetron, Propofol infusion and Midazolam  Airway Management Planned: Natural Airway and Simple Face Mask  Additional Equipment: None  Intra-op Plan:   Post-operative Plan:   Informed Consent: I have reviewed the patients History and Physical, chart, labs and discussed the procedure including the risks, benefits and alternatives for the proposed anesthesia with the patient or authorized representative who has indicated his/her understanding and acceptance.       Plan Discussed with: CRNA  Anesthesia Plan Comments: (Covid-19 Nucleic Acid Test Results Lab Results      Component                Value               Date                      [place]              NEGATIVE            08/09/2020             Lab Results      Component                 Value               Date                      WBC                      19.5 (H)            08/09/2020                HGB                      12.1 (L)            08/09/2020                HCT                      36.9 (L)            08/09/2020  MCV                      94.4                08/09/2020                PLT                      211                 08/09/2020           )        Anesthesia Quick Evaluation

## 2020-08-10 DIAGNOSIS — E119 Type 2 diabetes mellitus without complications: Secondary | ICD-10-CM

## 2020-08-10 DIAGNOSIS — Z9889 Other specified postprocedural states: Secondary | ICD-10-CM

## 2020-08-10 DIAGNOSIS — I10 Essential (primary) hypertension: Secondary | ICD-10-CM

## 2020-08-10 LAB — COMPREHENSIVE METABOLIC PANEL
ALT: 16 U/L (ref 0–44)
AST: 12 U/L — ABNORMAL LOW (ref 15–41)
Albumin: 2.4 g/dL — ABNORMAL LOW (ref 3.5–5.0)
Alkaline Phosphatase: 53 U/L (ref 38–126)
Anion gap: 9 (ref 5–15)
BUN: 18 mg/dL (ref 8–23)
CO2: 25 mmol/L (ref 22–32)
Calcium: 8.3 mg/dL — ABNORMAL LOW (ref 8.9–10.3)
Chloride: 100 mmol/L (ref 98–111)
Creatinine, Ser: 1.05 mg/dL (ref 0.61–1.24)
GFR calc Af Amer: >60 mL/min (ref >60)
GFR calc non Af Amer: >60 mL/min (ref >60)
Glucose, Bld: 214 mg/dL — ABNORMAL HIGH (ref 70–99)
Potassium: 3.8 mmol/L (ref 3.5–5.1)
Sodium: 134 mmol/L — ABNORMAL LOW (ref 135–145)
Total Bilirubin: 1 mg/dL (ref 0.3–1.2)
Total Protein: 5.2 g/dL — ABNORMAL LOW (ref 6.5–8.1)

## 2020-08-10 LAB — GLUCOSE, CAPILLARY
Glucose-Capillary: 207 mg/dL — ABNORMAL HIGH (ref 70–99)
Glucose-Capillary: 211 mg/dL — ABNORMAL HIGH (ref 70–99)
Glucose-Capillary: 233 mg/dL — ABNORMAL HIGH (ref 70–99)
Glucose-Capillary: 174 mg/dL — ABNORMAL HIGH (ref 70–99)

## 2020-08-10 LAB — CBC
HCT: 33.7 % — ABNORMAL LOW (ref 39.0–52.0)
Hemoglobin: 11.3 g/dL — ABNORMAL LOW (ref 13.0–17.0)
MCH: 31.2 pg (ref 26.0–34.0)
MCHC: 33.5 g/dL (ref 30.0–36.0)
MCV: 93.1 fL (ref 80.0–100.0)
Platelets: 198 10*3/uL (ref 150–400)
RBC: 3.62 MIL/uL — ABNORMAL LOW (ref 4.22–5.81)
RDW: 12.8 % (ref 11.5–15.5)
WBC: 15.1 10*3/uL — ABNORMAL HIGH (ref 4.0–10.5)
nRBC: 0 % (ref 0.0–0.2)

## 2020-08-10 MED ORDER — GLIMEPIRIDE 4 MG PO TABS
4.0000 mg | ORAL_TABLET | Freq: Every day | ORAL | Status: DC
  Administered 2020-08-10: 4 mg via ORAL
  Filled 2020-08-10 (×2): qty 1

## 2020-08-10 MED ORDER — LINAGLIPTIN 5 MG PO TABS
5.0000 mg | ORAL_TABLET | Freq: Every day | ORAL | Status: DC
  Administered 2020-08-10 – 2020-08-16 (×7): 5 mg via ORAL
  Filled 2020-08-10 (×7): qty 1

## 2020-08-10 MED ORDER — GLIMEPIRIDE 4 MG PO TABS: 4.0000 mg | ORAL_TABLET | Freq: Every day | ORAL | Status: DC

## 2020-08-10 NOTE — Op Note (Signed)
Patient Name: Dean Rasmussen DOB: [birth year]  MRN: [id number]   Date of Service: 08/08/2020 - 08/09/2020  Surgeon: Dr. Sharl Ma, DPM Assistants: None Pre-operative Diagnosis:  1. Gas gangrene of right foot 2. Charcot neuroarthropathy right foot 3. Diabetic foot infection right foot Post-operative Diagnosis:  1. Gas gangrene of right foot 2. Charcot neuroarthropathy right foot 3. Diabetic foot infection right foot Procedures: 1. Extensive incision and drainage of right foot infection multiple fascial planes 2. Insertion nonbiodegradable drug delivery implant Pathology/Specimens: ID Type Source Tests Collected by Time Destination  2 : First Metatarsal Bone Tissue PATH Other SURGICAL PATHOLOGY Edwin Cap, DPM 08/09/2020 1835   A : Abscess Right Foot Tissue PATH Other AEROBIC/ANAEROBIC CULTURE (SURGICAL/DEEP WOUND) Edwin Cap, DPM 08/09/2020 1827   B : First Metatarsal Bone Culture Tissue PATH Other AEROBIC/ANAEROBIC CULTURE (SURGICAL/DEEP WOUND) Edwin Cap, DPM 08/09/2020 1834    Anesthesia: IV sedation with local anesthesia Hemostasis:  Total Tourniquet Time Documented: Calf (Right) - 21 minutes Total: Calf (Right) - 21 minutes  Estimated Blood Loss: 100 mL Materials:  Implant Name Type Inv. Item Serial No. Manufacturer Lot No. LRB No. Used Action  BONE CEMENT GENTAMICIN - [id number] Cement BONE CEMENT GENTAMICIN  DEPUY SYNTHES [id number] Right 1 Implanted   Medications: 20 cc 0.5% bupivacaine and 2% lidocaine plain in a 50-50 mixture Complications: None  Indications for Procedure:  This is a 69 y.o. male with a history of type 2 diabetes and polyneuropathy secondary to this as well as newly diagnosed Charcot neuroarthropathy.  He was seen in the office by my partner Dr. Ardelle Anton after referral from the urgent care center for a right foot infection.  He was admitted for IV antibiotics and operative intervention.   Procedure in Detail: Patient was identified in  pre-operative holding area. Formal consent was signed and the right lower extremity was marked. Patient was brought back to the operating room. Anesthesia was induced. The extremity was prepped and draped in the usual sterile fashion. Timeout was taken to confirm patient name, laterality, and procedure prior to incision.   Attention was then directed to the right foot where an area of a draining sinus on the plantar medial 1st tarsometatarsal joint was noted with severe cellulitis and edema to the level of the ankle joint.  The sinus is actively draining purulence.  There is an area approximately 0.5 cm in diameter proximal to this that exhibited necrosis of the skin.  A curvilinear incision was extended from a sinus tract dorsally over the medial cuneiform to the level of the talonavicular joint.  This was carried to the level of the deep fascia.  When the incision was made a significant amount of purulence was expressed from the dorsal medial and plantar foot, approximately 20 cc.  A culture of this purulence was taken and sent for microbiologic culture.  The superficial fascial plane was then manually explored with blunt instrumentation and all pockets of infection were thoroughly expressed.  This necessitated counterincision was made on the dorsal and plantar midfoot in order to thoroughly express all the infection.  This was present dorsally plantarly and medially along the foot.  The sites were thoroughly irrigated.  The incision through the deep fascia was then made to expose the medial compartment of the plantar foot as well as the 1st tarsometatarsal joint.  Small amount of purulence was present in the plantar compartment and this was irrigated thoroughly.  The remaining 3 L of irrigation was then lavage copiously throughout  the wound using a pulse lavage irrigator.  The abductor hallucis was then retracted plantarly and the 1st tarsometatarsal joint was then exposed.  Exhibited clinical evidence of  consolidated Charcot collapse with prominence of the plantar 1st metatarsal base as well as the plantar medial cuneiform.  This corresponded to the site on his MRI that was concerning for osteomyelitis.  Using a sagittal saw and osteotome an exostectomy was performed to remove the prominence.  The bone that was removed for sent for histopathology and microbiology culture.  The wound was then again thoroughly irrigated.  Antibiotic beads consisting of polymethylmethacrylate bone cement with gentamicin were then fashioned and placed on a #0 Prolene suture.  These were packed into the wound and retention sutures were placed across the incision.  The foot was then dressed with 4 x 4 gauze, ABD pads, Kerlix and an Ace wrap under compression. Patient tolerated the procedure well.   Disposition: Following a period of post-operative monitoring, patient will be transferred to the floor for further antibiotics.  This is a 1st stage of a planned staged return to the operating room for further washout and possible closure.

## 2020-08-10 NOTE — Progress Notes (Signed)
PROGRESS NOTE    Dean Rasmussen   PIR:[id number]  DOB: 01-09-1951  DOA: 08/08/2020 PCP: Patient, [name]   Brief Narrative:  Dean Rasmussen  an 69 y.o. male with PMH significant for poorly controlled DM 2, HTN who moved here last week from [place] who developed swelling of his right foot over the course of a couple of weeks. An outpatient MRI revealed an abscess in his foot and he was sent to the ED.  He was recently placed on Decadron for lower back pain which has exacerbated his sugars.    Subjective: No complaints.     Assessment & Plan:   Principal Problem:   Abscess of right oot with possible Osteomyelitis of right foot  - s/p I and D and placement of antibiotic beads on 9/10 and bone biopsy - few gram + cocci seen on gram stain of abscess culture - await 1st metatarsal bone biopsy results - cont IV antibiotics  Active Problems:   Essential hypertension - cont Toprol      Type 2 diabetes mellitus without complication, without long-term current use of insulin  - uncontrolled with hyperglycemia - A1c is 9 and I have discussed his inadequate control with him- he states that he has been told many times before and seems irritated by my discussion - He needs to be on long acting insulin at home- he is on Lispro TID with meals- will start Lantus tonight - given Amaryl today but as I am starting Lantus tonight,  Amaryl should be discontinued tomorrow - also takes Januvia and a very large dose of Metformin at bedtime     Time spent in minutes: 35 DVT prophylaxis: Lovenox Code Status: Full code Family Communication:  Disposition Plan:  Status is: Inpatient  Remains inpatient appropriate because:IV antibiotics for severe infection   Dispo: The patient is from: Home              Anticipated d/c is to: TBD              Anticipated d/c date is: 2 days              Patient currently is not medically stable to d/c.      Consultants:   ortho Procedures:   I and  D Antimicrobials:  Anti-infectives (From admission, onward)   Start     Dose/Rate Route Frequency Ordered Stop   08/10/20 0200  vancomycin (VANCOCIN) IVPB 1000 mg/200 mL premix        1,000 mg 200 mL/hr over 60 Minutes Intravenous Every 12 hours 08/09/20 1233     08/09/20 2000  piperacillin-tazobactam (ZOSYN) IVPB 3.375 g       "Followed by" Linked Group Details   3.375 g 12.5 mL/hr over 240 Minutes Intravenous Every 8 hours 08/09/20 1327     08/09/20 1330  piperacillin-tazobactam (ZOSYN) IVPB 3.375 g       "Followed by" Linked Group Details   3.375 g 100 mL/hr over 30 Minutes Intravenous  Once 08/09/20 1327     08/09/20 1130  vancomycin (VANCOREADY) IVPB 1500 mg/300 mL        1,500 mg 150 mL/hr over 120 Minutes Intravenous STAT 08/09/20 1129 08/09/20 1607   08/09/20 1100  cefTRIAXone (ROCEPHIN) 2 g in sodium chloride 0.9 % 100 mL IVPB        2 g 200 mL/hr over 30 Minutes Intravenous  Once 08/09/20 1048 08/09/20 1347   08/09/20 1100  metroNIDAZOLE (FLAGYL) IVPB 500 mg  500 mg 100 mL/hr over 60 Minutes Intravenous  Once 08/09/20 1048 08/09/20 1347   08/09/20 0945  cefTRIAXone (ROCEPHIN) 2 g in sodium chloride 0.9 % 100 mL IVPB  Status:  Discontinued       "And" Linked Group Details   2 g 200 mL/hr over 30 Minutes Intravenous Every 24 hours 08/09/20 0941 08/09/20 1010   08/09/20 0945  metroNIDAZOLE (FLAGYL) IVPB 500 mg  Status:  Discontinued       "And" Linked Group Details   500 mg 100 mL/hr over 60 Minutes Intravenous Every 8 hours 08/09/20 0941 08/09/20 1010       Objective: Vitals:   08/09/20 2023 08/10/20 0218 08/10/20 0400 08/10/20 0911  BP: (!) 132/94 (!) 159/83 139/90 (!) 148/91  Pulse: 95 99 89 94  Resp: 17 17 18    Temp: 97.9 F (36.6 C) 98.8 F (37.1 C) 98.7 F (37.1 C) 98.4 F (36.9 C)  TempSrc: Oral Oral Oral Oral  SpO2: 100% 100% 100% 100%  Weight:      Height:        Intake/Output Summary (Last 24 hours) at 08/10/2020 1258 Last data filed at  08/10/2020 1202 Gross per 24 hour  Intake 1539.99 ml  Output 1250 ml  Net 289.99 ml   Filed Weights   08/09/20 1128  Weight: 81.6 kg    Examination: General exam: Appears comfortable  HEENT: PERRLA, oral mucosa moist, no sclera icterus or thrush Respiratory system: Clear to auscultation. Respiratory effort normal. Cardiovascular system: S1 & S2 heard, RRR.   Gastrointestinal system: Abdomen soft, non-tender, nondistended. Normal bowel sounds. Central nervous system: Alert and oriented. No focal neurological deficits. Extremities: No cyanosis, clubbing - right foot in dressing- not opened Skin: No rashes or ulcers Psychiatry:  Mood & affect appropriate.     Data Reviewed: I have personally reviewed following labs and imaging studies  CBC: Recent Labs  Lab 08/08/20 2055 08/09/20 0919 08/09/20 1350 08/10/20 0628  WBC 21.4* 23.1* 19.5* 15.1*  NEUTROABS 19.9*  --   --   --   HGB 12.0* 13.7 12.1* 11.3*  HCT 36.8* 42.2 36.9* 33.7*  MCV 94.4 95.9 94.4 93.1  PLT 240 241 211 198   Basic Metabolic Panel: Recent Labs  Lab 08/08/20 2055 08/09/20 1350 08/10/20 0628  NA 136  --  134*  K 4.0  --  3.8  CL 101  --  100  CO2 26  --  25  GLUCOSE 301*  --  214*  BUN 32*  --  18  CREATININE 1.12 1.13 1.05  CALCIUM 8.9  --  8.3*   GFR: Estimated Creatinine Clearance: 68.6 mL/min (by C-G formula based on SCr of 1.05 mg/dL). Liver Function Tests: Recent Labs  Lab 08/08/20 2055 08/10/20 0628  AST 17 12*  ALT 24 16  ALKPHOS 58 53  BILITOT 0.9 1.0  PROT 6.5 5.2*  ALBUMIN 3.3* 2.4*   No results for input(s): LIPASE, AMYLASE in the last 168 hours. No results for input(s): AMMONIA in the last 168 hours. Coagulation Profile: Recent Labs  Lab 08/08/20 2055 08/09/20 0919  INR 1.0 1.0   Cardiac Enzymes: No results for input(s): CKTOTAL, CKMB, CKMBINDEX, TROPONINI in the last 168 hours. BNP (last 3 results) No results for input(s): PROBNP in the last 8760  hours. HbA1C: Recent Labs    08/09/20 1400  HGBA1C 9.0*   CBG: Recent Labs  Lab 08/09/20 0931 08/09/20 1615 08/09/20 1907 08/10/20 0643 08/10/20 1112  GLUCAP 254* 214*  199* 207* 211*   Lipid Profile: No results for input(s): CHOL, HDL, LDLCALC, TRIG, CHOLHDL, LDLDIRECT in the last 72 hours. Thyroid Function Tests: Recent Labs    08/09/20 1301  TSH 0.892   Anemia Panel: No results for input(s): VITAMINB12, FOLATE, FERRITIN, TIBC, IRON, RETICCTPCT in the last 72 hours. Urine analysis: No results found for: COLORURINE, APPEARANCEUR, LABSPEC, PHURINE, GLUCOSEU, HGBUR, BILIRUBINUR, KETONESUR, PROTEINUR, UROBILINOGEN, NITRITE, LEUKOCYTESUR Sepsis Labs: @LABRCNTIP (procalcitonin:4,lacticidven:4) ) Recent Results (from the past 240 hour(s))  WOUND CULTURE     Status: None (Preliminary result)   Collection Time: 08/07/20 10:31 AM   Specimen: Foot, Right; Wound  Result Value Ref Range Status   MICRO NUMBER: 10/07/20  Preliminary   SPECIMEN QUALITY: Adequate  Preliminary   SOURCE: WOUND (SITE NOT SPECIFIED)  Preliminary   STATUS: PRELIMINARY  Preliminary   GRAM STAIN:   Preliminary    No white blood cells seen No epithelial cells seen Moderate Gram negative bacilli   RESULT: Culture in progress  Preliminary  Culture, blood (Routine x 2)     Status: None (Preliminary result)   Collection Time: 08/08/20  8:50 PM   Specimen: BLOOD RIGHT ARM  Result Value Ref Range Status   Specimen Description BLOOD RIGHT ARM  Final   Special Requests   Final    BOTTLES DRAWN AEROBIC AND ANAEROBIC Blood Culture adequate volume   Culture   Final    NO GROWTH < 12 HOURS Performed at [facility], [address], [place], [place]    Report Status PENDING  Incomplete  Culture, blood (Routine x 2)     Status: None (Preliminary result)   Collection Time: 08/08/20  8:56 PM   Specimen: BLOOD RIGHT HAND  Result Value Ref Range Status   Specimen Description BLOOD RIGHT HAND  Final    Special Requests   Final    BOTTLES DRAWN AEROBIC ONLY Blood Culture results may not be optimal due to an inadequate volume of blood received in culture bottles   Culture   Final    NO GROWTH < 12 HOURS Performed at [facility], [address], [place], [place]    Report Status PENDING  Incomplete  SARS Coronavirus 2 by RT PCR (hospital order, performed [facility] lab) Nasopharyngeal Nasopharyngeal Swab     Status: None   Collection Time: 08/09/20 11:56 AM   Specimen: Nasopharyngeal Swab  Result Value Ref Range Status   SARS Coronavirus 2 NEGATIVE NEGATIVE Final    Comment: (NOTE) SARS-CoV-2 target nucleic acids are NOT DETECTED.  The SARS-CoV-2 RNA is generally detectable in upper and lower respiratory specimens during the acute phase of infection. The lowest concentration of SARS-CoV-2 viral copies this assay can detect is 250 copies / mL. A negative result does not preclude SARS-CoV-2 infection and should not be used as the sole basis for treatment or other patient management decisions.  A negative result may occur with improper specimen collection / handling, submission of specimen other than nasopharyngeal swab, presence of viral mutation(s) within the areas targeted by this assay, and inadequate number of viral copies (<250 copies / mL). A negative result must be combined with clinical observations, patient history, and epidemiological information.  Fact Sheet for Patients:   10/09/20  Fact Sheet for Healthcare Providers: BoilerBrush.com.cy  This test is not yet approved or  cleared by the https://pope.com/ FDA and has been authorized for detection and/or diagnosis of SARS-CoV-2 by FDA under an  Emergency Use Authorization (EUA).  This EUA will remain in effect (meaning this test can be used) for the duration of the COVID-19 declaration under Section 564(b)(1) of the Act, 21 U.S.C. section  360bbb-3(b)(1), unless the authorization is terminated or revoked sooner.  Performed at [facility], [address] [place], [place] [address]   Aerobic/Anaerobic Culture (surgical/deep wound)     Status: None (Preliminary result)   Collection Time: 08/09/20  6:27 PM   Specimen: PATH Other; Tissue  Result Value Ref Range Status   Specimen Description ABSCESS RIGHT FOOT  Final   Special Requests SPEC A  Final   Gram Stain   Final    FEW WBC PRESENT, PREDOMINANTLY PMN ABUNDANT GRAM POSITIVE COCCI ABUNDANT GRAM NEGATIVE RODS Performed at [facility], [address] [place], [place] [address]    Culture PENDING  Incomplete   Report Status PENDING  Incomplete  Aerobic/Anaerobic Culture (surgical/deep wound)     Status: None (Preliminary result)   Collection Time: 08/09/20  6:34 PM   Specimen: PATH Other; Tissue  Result Value Ref Range Status   Specimen Description TISSUE  Final   Special Requests FIRST METATARSAL BONE CULTURE SPEC B  Final   Gram Stain   Final    NO WBC SEEN FEW GRAM POSITIVE COCCI Performed at [facility], [address] [place], [place] [address]    Culture PENDING  Incomplete   Report Status PENDING  Incomplete         Radiology Studies: MR FOOT RIGHT WO CONTRAST  Result Date: 08/09/2020 CLINICAL DATA:  Draining right foot wound.  Diabetes.  Leukocytosis. EXAM: MRI OF THE RIGHT FOREFOOT WITHOUT CONTRAST TECHNIQUE: Multiplanar, multisequence MR imaging of the right forefoot was performed. No intravenous contrast was administered. COMPARISON:  X-ray 08/07/2020 FINDINGS: There is a focal soft tissue ulceration at the plantar aspect of the right foot underlying the level of the first tarsometatarsal joint with sinus track extending into the plantar aspect of the first TMT joint (series 7, images 23-24). There is marked associated soft tissue edema and skin thickening at the plantar medial aspect of the foot. Along the medial and dorsal  aspect of the foot, there is a large fluid and air containing collection measuring approximately 6.7 x 1.4 x 3.3 cm (series 8, image 25; series 7, images 21-32). There is bone marrow edema with loss of cortical margin and low T1 marrow signal along the plantar aspect of the first metatarsal base at the level of the previously described soft tissue ulceration (series 3 and 8, images 22). There is also subtle marrow edema within the plantar aspect of the adjacent medial cuneiform. There are advanced degenerative changes throughout the midfoot, particularly involving the first and second TMT joints, naviculocuneiform, and intercuneiform joints with associated subchondral marrow signal changes. Findings may reflect a neuropathic joint. Severe arthropathy at the first MTP joint with complete joint space loss and subchondral cystic changes along both sides of joint. No acute fracture. No dislocation. Fatty atrophy of the intrinsic foot musculature with diffuse edema-like signal suggesting denervation changes and/or myositis. Grossly intact tendinous structures without tenosynovial fluid collection. IMPRESSION: 1. Focal soft tissue ulceration at the plantar aspect of the right foot underlying the level of the first tarsometatarsal joint with sinus track extending into the plantar aspect of the first TMT joint. Subtle signal changes within the plantar aspects of the first metatarsal base and medial cuneiform compatible with acute osteomyelitis. 2. Suspect septic  arthritis of the first TMT joint. 3. Large fluid and air containing collection along the medial and dorsal aspect of the midfoot measuring up to 6.7 cm, consistent with abscess. 4. Advanced degenerative changes throughout the midfoot with associated subchondral marrow signal changes. Findings may reflect a neuropathic joint. Signal changes at this level related to an infectious arthropathy would be difficult to exclude. 5. Fatty atrophy of the intrinsic foot  musculature with diffuse edema-like signal suggesting denervation changes and/or myositis. Electronically Signed   By: Duanne Guess D.O.   On: 08/09/2020 10:24   DG Foot Complete Right  Result Date: 08/09/2020 CLINICAL DATA:  Right foot surgery EXAM: RIGHT FOOT COMPLETE - 3+ VIEW COMPARISON:  08/07/2020 FINDINGS: Decreased soft tissue swelling at the level of the MTP joint and tarsal bones with interim placement of water presumed to represent antibiotic beads. Irregular arthropathy at the first TMT joint with irregular cortical margin of the medial cuneiform. Advanced degenerative changes of the first MTP joint. IMPRESSION: 1. Presumed placement of antibiotic beads at the level of the first MTP joint and tarsal bones with decreased soft tissue swelling. 2. Irregular arthropathy at the first TMT joint with irregular cortical margin of the cuneiform as before. 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 Electronically Signed   By: Jasmine Pang M.D.   On: 08/09/2020 21:03      Scheduled Meds: . chlorhexidine  15 mL Mouth/Throat NOW  . enoxaparin (LOVENOX) injection  40 mg Subcutaneous Q24H  . glimepiride  4 mg Oral Q breakfast  . insulin aspart  0-15 Units Subcutaneous TID WC  . levothyroxine  25 mcg Oral Q0600  . linagliptin  5 mg Oral Daily  . metoprolol succinate  100 mg Oral Daily  . sodium chloride flush  3 mL Intravenous Q12H   Continuous Infusions: . sodium chloride 75 mL/hr at 08/09/20 1405  . sodium chloride 250 mL (08/09/20 2044)  . lactated ringers    . piperacillin-tazobactam     Followed by  . piperacillin-tazobactam (ZOSYN)  IV 3.375 g (08/10/20 1217)  . vancomycin 1,000 mg (08/10/20 0255)     LOS: 1 day      Calvert Cantor, MD [facility] Pager: [email or web address] 08/10/2020, 12:58 PM

## 2020-08-10 NOTE — Progress Notes (Signed)
  Subjective:  Patient ID: Dean Rasmussen, male    DOB: Nov 05, 1951,  MRN: [id number]  Feeling well today, the best he has since admission.  Having some sharp pains in the incision sites but is manageable without pain medication.  Negative for chest pain and shortness of breath Fever: no Night sweats: no Weight loss: no Constitutional signs: no Review of all other systems is negative Objective:   Vitals:   08/10/20 0400 08/10/20 0911  BP: 139/90 (!) 148/91  Pulse: 89 94  Resp: 18   Temp: 98.7 F (37.1 C) 98.4 F (36.9 C)  SpO2: 100% 100%   General AA&O x3. Normal mood and affect.  Vascular Dorsalis pedis and posterior tibial pulses 2/4 bilat. Brisk capillary refill to all digits. Pedal hair present.  Neurologic Epicritic sensation grossly absent.  Dermatologic  cellulitis improved greatly, edema has reduced, serosanguineous drainage from the incision sites and antibiotic needs.  Orthopedic:  Mild pain on palpation right foot          Assessment & Plan:  Patient was evaluated and treated and all questions answered.  POD #1 status post right foot incision and drainage, exostectomy/bone biopsy and placement of antibiotic beads -Operative cultures with abundant GPC's and GNR's, have not speciated.  Recommend to continue broad-spectrum antibiotics at this time, can narrow PRN with speciation -Histopathology pending -Hemoglobin A1c 9.0 -NWB RLE -Continue ice and elevation RLE -Has improved significantly.  We will plan to return to the operating room on Monday 9/13 with myself or my partner Dr. Ardelle Anton for further washout, removal of the antibiotic beads and partial or full closure of the incision.  Hopefully will be able to be discharged home thereafter on p.o. antibiotics -NPO past MN on Monday 9/13  Edwin Cap, DPM  Accessible via secure chat for questions or concerns.

## 2020-08-11 LAB — BASIC METABOLIC PANEL
Anion gap: 6 (ref 5–15)
BUN: 17 mg/dL (ref 8–23)
CO2: 29 mmol/L (ref 22–32)
Calcium: 8.3 mg/dL — ABNORMAL LOW (ref 8.9–10.3)
Chloride: 101 mmol/L (ref 98–111)
Creatinine, Ser: 1.17 mg/dL (ref 0.61–1.24)
GFR calc Af Amer: >60 mL/min (ref >60)
GFR calc non Af Amer: >60 mL/min (ref >60)
Glucose, Bld: 236 mg/dL — ABNORMAL HIGH (ref 70–99)
Potassium: 3.8 mmol/L (ref 3.5–5.1)
Sodium: 136 mmol/L (ref 135–145)

## 2020-08-11 LAB — CBC
HCT: 32.2 % — ABNORMAL LOW (ref 39.0–52.0)
Hemoglobin: 10.8 g/dL — ABNORMAL LOW (ref 13.0–17.0)
MCH: 31.8 pg (ref 26.0–34.0)
MCHC: 33.5 g/dL (ref 30.0–36.0)
MCV: 94.7 fL (ref 80.0–100.0)
Platelets: 187 10*3/uL (ref 150–400)
RBC: 3.4 MIL/uL — ABNORMAL LOW (ref 4.22–5.81)
RDW: 12.7 % (ref 11.5–15.5)
WBC: 11.7 10*3/uL — ABNORMAL HIGH (ref 4.0–10.5)
nRBC: 0 % (ref 0.0–0.2)

## 2020-08-11 LAB — GLUCOSE, CAPILLARY
Glucose-Capillary: 187 mg/dL — ABNORMAL HIGH (ref 70–99)
Glucose-Capillary: 283 mg/dL — ABNORMAL HIGH (ref 70–99)
Glucose-Capillary: 219 mg/dL — ABNORMAL HIGH (ref 70–99)
Glucose-Capillary: 194 mg/dL — ABNORMAL HIGH (ref 70–99)

## 2020-08-11 LAB — WOUND CULTURE
MICRO NUMBER:: 10923405
SPECIMEN QUALITY:: ADEQUATE

## 2020-08-11 MED ORDER — INSULIN GLARGINE 100 UNIT/ML ~~LOC~~ SOLN
10.0000 [IU] | Freq: Every day | SUBCUTANEOUS | Status: DC
  Administered 2020-08-11 – 2020-08-14 (×4): 10 [IU] via SUBCUTANEOUS
  Filled 2020-08-11 (×5): qty 0.1

## 2020-08-11 NOTE — Progress Notes (Signed)
PROGRESS NOTE    Dean Rasmussen   RUE:[id number]  DOB: 22-Jun-1951  DOA: 08/08/2020 PCP: Patient, [name]   Brief Narrative:  Dean Rasmussen  an 69 y.o. male with PMH significant for poorly controlled DM 2, HTN who moved here last week from [place] who developed swelling of his right foot over the course of a couple of weeks. An outpatient MRI revealed an abscess in his foot and he was sent to the ED.  He was recently placed on Decadron for lower back pain which has exacerbated his sugars.    Subjective: No complaints    Assessment & Plan:   Principal Problem:   Abscess of right oot with possible Osteomyelitis of right foot  - s/p I and D and placement of antibiotic beads on 9/10 and bone biopsy - few gram + cocci seen on gram stain of abscess culture - await 1st metatarsal bone biopsy results - blood cultures negative thus far - cont IV antibiotics- the patient states he was told by the podiatrist that he will go back to the OR tomorrow  Active Problems:   Essential hypertension - cont Toprol      Type 2 diabetes mellitus without complication, without long-term current use of insulin  - uncontrolled with hyperglycemia - A1c is 9 and I have discussed his inadequate control with him- he states that he has been told many times before and seems irritated by my discussion -  he is on Lispro TID with meals on his med rec but states he only used it for a short period in the past and does not take it routinely- I have explained that he will need to start a short acting and a long acting insulin and learn how to do the injections- he states his wife is a Engineer, civil (consulting) and she can do them - will start Lantus tonight - also takes Januvia (receiving Tradjenta in hospital) and a very large dose of Metformin at bedtime   Time spent in minutes: 35 DVT prophylaxis: Lovenox Code Status: Full code Family Communication:  Disposition Plan:  Status is: Inpatient  Remains inpatient appropriate  because:IV antibiotics for severe infection   Dispo: The patient is from: Home              Anticipated d/c is to: TBD              Anticipated d/c date is: 2 days              Patient currently is not medically stable to d/c.    Consultants:   Podiatry Procedures:   I and D Antimicrobials:  Anti-infectives (From admission, onward)   Start     Dose/Rate Route Frequency Ordered Stop   08/10/20 0200  vancomycin (VANCOCIN) IVPB 1000 mg/200 mL premix        1,000 mg 200 mL/hr over 60 Minutes Intravenous Every 12 hours 08/09/20 1233     08/09/20 2000  piperacillin-tazobactam (ZOSYN) IVPB 3.375 g       "Followed by" Linked Group Details   3.375 g 12.5 mL/hr over 240 Minutes Intravenous Every 8 hours 08/09/20 1327     08/09/20 1330  piperacillin-tazobactam (ZOSYN) IVPB 3.375 g       "Followed by" Linked Group Details   3.375 g 100 mL/hr over 30 Minutes Intravenous  Once 08/09/20 1327     08/09/20 1130  vancomycin (VANCOREADY) IVPB 1500 mg/300 mL        1,500 mg 150 mL/hr  over 120 Minutes Intravenous STAT 08/09/20 1129 08/09/20 1607   08/09/20 1100  cefTRIAXone (ROCEPHIN) 2 g in sodium chloride 0.9 % 100 mL IVPB        2 g 200 mL/hr over 30 Minutes Intravenous  Once 08/09/20 1048 08/09/20 1347   08/09/20 1100  metroNIDAZOLE (FLAGYL) IVPB 500 mg        500 mg 100 mL/hr over 60 Minutes Intravenous  Once 08/09/20 1048 08/09/20 1347   08/09/20 0945  cefTRIAXone (ROCEPHIN) 2 g in sodium chloride 0.9 % 100 mL IVPB  Status:  Discontinued       "And" Linked Group Details   2 g 200 mL/hr over 30 Minutes Intravenous Every 24 hours 08/09/20 0941 08/09/20 1010   08/09/20 0945  metroNIDAZOLE (FLAGYL) IVPB 500 mg  Status:  Discontinued       "And" Linked Group Details   500 mg 100 mL/hr over 60 Minutes Intravenous Every 8 hours 08/09/20 0941 08/09/20 1010       Objective: Vitals:   08/10/20 1341 08/10/20 1656 08/10/20 2026 08/11/20 0618  BP: 108/67  113/76 (!) 158/93  Pulse: 97  90  (!) 108  Resp:    18  Temp: 98.7 F (37.1 C) 99.9 F (37.7 C) 99 F (37.2 C) 98.4 F (36.9 C)  TempSrc: Oral Oral Oral Oral  SpO2: 99%  97% 100%  Weight:      Height:        Intake/Output Summary (Last 24 hours) at 08/11/2020 1153 Last data filed at 08/10/2020 1950 Gross per 24 hour  Intake 794.96 ml  Output 1175 ml  Net -380.04 ml   Filed Weights   08/09/20 1128  Weight: 81.6 kg    Examination: General exam: Appears comfortable  HEENT: PERRLA, oral mucosa moist, no sclera icterus or thrush Respiratory system: Clear to auscultation. Respiratory effort normal. Cardiovascular system: S1 & S2 heard,  No murmurs  Gastrointestinal system: Abdomen soft, non-tender, nondistended. Normal bowel sounds  Central nervous system: Alert and oriented. No focal neurological deficits. Extremities: No cyanosis, clubbing or edema- right foot dressing not opened Skin: No rashes or ulcers Psychiatry:  Mood & affect appropriate.    Data Reviewed: I have personally reviewed following labs and imaging studies  CBC: Recent Labs  Lab 08/08/20 2055 08/09/20 0919 08/09/20 1350 08/10/20 0628 08/11/20 0431  WBC 21.4* 23.1* 19.5* 15.1* 11.7*  NEUTROABS 19.9*  --   --   --   --   HGB 12.0* 13.7 12.1* 11.3* 10.8*  HCT 36.8* 42.2 36.9* 33.7* 32.2*  MCV 94.4 95.9 94.4 93.1 94.7  PLT 240 241 211 198 187   Basic Metabolic Panel: Recent Labs  Lab 08/08/20 2055 08/09/20 1350 08/10/20 0628 08/11/20 0431  NA 136  --  134* 136  K 4.0  --  3.8 3.8  CL 101  --  100 101  CO2 26  --  25 29  GLUCOSE 301*  --  214* 236*  BUN 32*  --  18 17  CREATININE 1.12 1.13 1.05 1.17  CALCIUM 8.9  --  8.3* 8.3*   GFR: Estimated Creatinine Clearance: 61.5 mL/min (by C-G formula based on SCr of 1.17 mg/dL). Liver Function Tests: Recent Labs  Lab 08/08/20 2055 08/10/20 0628  AST 17 12*  ALT 24 16  ALKPHOS 58 53  BILITOT 0.9 1.0  PROT 6.5 5.2*  ALBUMIN 3.3* 2.4*   No results for input(s): LIPASE,  AMYLASE in the last 168 hours. No results for  input(s): AMMONIA in the last 168 hours. Coagulation Profile: Recent Labs  Lab 08/08/20 2055 08/09/20 0919  INR 1.0 1.0   Cardiac Enzymes: No results for input(s): CKTOTAL, CKMB, CKMBINDEX, TROPONINI in the last 168 hours. BNP (last 3 results) No results for input(s): PROBNP in the last 8760 hours. HbA1C: Recent Labs    08/09/20 1400  HGBA1C 9.0*   CBG: Recent Labs  Lab 08/10/20 0643 08/10/20 1112 08/10/20 1654 08/10/20 2129 08/11/20 0713  GLUCAP 207* 211* 233* 174* 187*   Lipid Profile: No results for input(s): CHOL, HDL, LDLCALC, TRIG, CHOLHDL, LDLDIRECT in the last 72 hours. Thyroid Function Tests: Recent Labs    08/09/20 1301  TSH 0.892   Anemia Panel: No results for input(s): VITAMINB12, FOLATE, FERRITIN, TIBC, IRON, RETICCTPCT in the last 72 hours. Urine analysis: No results found for: COLORURINE, APPEARANCEUR, LABSPEC, PHURINE, GLUCOSEU, HGBUR, BILIRUBINUR, KETONESUR, PROTEINUR, UROBILINOGEN, NITRITE, LEUKOCYTESUR Sepsis Labs: @LABRCNTIP (procalcitonin:4,lacticidven:4) ) Recent Results (from the past 240 hour(s))  WOUND CULTURE     Status: Abnormal   Collection Time: 08/07/20 10:31 AM   Specimen: Foot, Right; Wound  Result Value Ref Range Status   MICRO NUMBER: 10/07/20  Final   SPECIMEN QUALITY: Adequate  Final   SOURCE: WOUND (SITE NOT SPECIFIED)  Final   STATUS: FINAL  Final   GRAM STAIN:   Final    No white blood cells seen No epithelial cells seen Moderate Gram negative bacilli   RESULT:   Final    Additional organisms of questionable significance were isolated that normally do not warrant identification and susceptibilities. Please contact the laboratory within three days if identification and susceptibilities are clinically indicated.   ISOLATE 1: Staphylococcus aureus (A)  Final    Comment: Heavy growth of Staphylococcus aureus This isolate demonstrates inducible clindamycin resistance.       Susceptibility   Staphylococcus aureus - AEROBIC CULT, GRAM STAIN POSITIVE 1    VANCOMYCIN <=0.5 Sensitive     CIPROFLOXACIN <=0.5 Sensitive     CLINDAMYCIN <=0.25 Resistant     LEVOFLOXACIN 0.25 Sensitive     ERYTHROMYCIN >=8 Resistant     GENTAMICIN <=0.5 Sensitive     OXACILLIN 0.5 Sensitive     TETRACYCLINE >=16 Resistant     TRIMETH/SULFA* <=10 Sensitive      * Legend:S = Susceptible  I = IntermediateR = Resistant  NS = Not susceptible* = Not tested  NR = Not reported**NN = See antimicrobic comments  Culture, blood (Routine x 2)     Status: None (Preliminary result)   Collection Time: 08/08/20  8:50 PM   Specimen: BLOOD RIGHT ARM  Result Value Ref Range Status   Specimen Description BLOOD RIGHT ARM  Final   Special Requests   Final    BOTTLES DRAWN AEROBIC AND ANAEROBIC Blood Culture adequate volume   Culture   Final    NO GROWTH 2 DAYS Performed at [facility], [address], [place], [place]    Report Status PENDING  Incomplete  Culture, blood (Routine x 2)     Status: None (Preliminary result)   Collection Time: 08/08/20  8:56 PM   Specimen: BLOOD RIGHT HAND  Result Value Ref Range Status   Specimen Description BLOOD RIGHT HAND  Final   Special Requests   Final    BOTTLES DRAWN AEROBIC ONLY Blood Culture results may not be optimal due to an inadequate volume of blood received in culture bottles   Culture   Final  NO GROWTH 2 DAYS Performed at [facility], [address] [place], [place] [address]    Report Status PENDING  Incomplete  SARS Coronavirus 2 by RT PCR (hospital order, performed in [facility] hospital lab) Nasopharyngeal Nasopharyngeal Swab     Status: None   Collection Time: 08/09/20 11:56 AM   Specimen: Nasopharyngeal Swab  Result Value Ref Range Status   SARS Coronavirus 2 NEGATIVE NEGATIVE Final    Comment: (NOTE) SARS-CoV-2 target nucleic acids are NOT DETECTED.  The SARS-CoV-2 RNA is generally detectable in upper and  lower respiratory specimens during the acute phase of infection. The lowest concentration of SARS-CoV-2 viral copies this assay can detect is 250 copies / mL. A negative result does not preclude SARS-CoV-2 infection and should not be used as the sole basis for treatment or other patient management decisions.  A negative result may occur with improper specimen collection / handling, submission of specimen other than nasopharyngeal swab, presence of viral mutation(s) within the areas targeted by this assay, and inadequate number of viral copies (<250 copies / mL). A negative result must be combined with clinical observations, patient history, and epidemiological information.  Fact Sheet for Patients:   BoilerBrush.com.cy  Fact Sheet for Healthcare Providers: https://pope.com/  This test is not yet approved or  cleared by the [place] FDA and has been authorized for detection and/or diagnosis of SARS-CoV-2 by FDA under an Emergency Use Authorization (EUA).  This EUA will remain in effect (meaning this test can be used) for the duration of the COVID-19 declaration under Section 564(b)(1) of the Act, 21 U.S.C. section 360bbb-3(b)(1), unless the authorization is terminated or revoked sooner.  Performed at [facility], [address], [place], [place] [address]   Aerobic/Anaerobic Culture (surgical/deep wound)     Status: None (Preliminary result)   Collection Time: 08/09/20  6:27 PM   Specimen: PATH Other; Tissue  Result Value Ref Range Status   Specimen Description ABSCESS RIGHT FOOT  Final   Special Requests SPEC A  Final   Gram Stain   Final    FEW WBC PRESENT, PREDOMINANTLY PMN ABUNDANT GRAM POSITIVE COCCI ABUNDANT GRAM NEGATIVE RODS    Culture   Final    CULTURE REINCUBATED FOR BETTER GROWTH Performed at [facility], [address] [place], [place] [address]    Report Status PENDING  Incomplete    Aerobic/Anaerobic Culture (surgical/deep wound)     Status: None (Preliminary result)   Collection Time: 08/09/20  6:34 PM   Specimen: PATH Other; Tissue  Result Value Ref Range Status   Specimen Description TISSUE  Final   Special Requests FIRST METATARSAL BONE CULTURE SPEC B  Final   Gram Stain NO WBC SEEN FEW GRAM POSITIVE COCCI   Final   Culture   Final    CULTURE REINCUBATED FOR BETTER GROWTH Performed at [facility], [address], [place], [place] [address]    Report Status PENDING  Incomplete         Radiology Studies: DG Foot Complete Right  Result Date: 08/09/2020 CLINICAL DATA:  Right foot surgery EXAM: RIGHT FOOT COMPLETE - 3+ VIEW COMPARISON:  08/07/2020 FINDINGS: Decreased soft tissue swelling at the level of the MTP joint and tarsal bones with interim placement of water presumed to represent antibiotic beads. Irregular arthropathy at the first TMT joint with irregular cortical margin of the medial cuneiform. Advanced degenerative changes of the first MTP joint. IMPRESSION: 1. Presumed placement  of antibiotic beads at the level of the first MTP joint and tarsal bones with decreased soft tissue swelling. 2. Irregular arthropathy at the first TMT joint with irregular cortical margin of the cuneiform as before. 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 Electronically Signed   By: Jasmine Pang M.D.   On: 08/09/2020 21:03      Scheduled Meds: . enoxaparin (LOVENOX) injection  40 mg Subcutaneous Q24H  . insulin aspart  0-15 Units Subcutaneous TID WC  . insulin glargine  10 Units Subcutaneous QHS  . levothyroxine  25 mcg Oral Q0600  . linagliptin  5 mg Oral Daily  . metoprolol succinate  100 mg Oral Daily  . sodium chloride flush  3 mL Intravenous Q12H   Continuous Infusions: . sodium chloride 250 mL (08/09/20 2044)  . lactated ringers    . piperacillin-tazobactam     Followed by  . piperacillin-tazobactam (ZOSYN)  IV 3.375 g (08/11/20  0507)  . vancomycin 1,000 mg (08/11/20 0233)     LOS: 2 days      Calvert Cantor, MD [facility] Pager: [email or web address] 08/11/2020, 11:53 AM

## 2020-08-12 ENCOUNTER — Encounter (HOSPITAL_COMMUNITY)
Admission: EM | Disposition: A | Discharge status: Home / Self Care | Payer: Self-pay | Source: Home / Self Care | Attending: Internal Medicine

## 2020-08-12 ENCOUNTER — Inpatient Hospital Stay (HOSPITAL_COMMUNITY): Payer: No Typology Code available for payment source | Admitting: Anesthesiology

## 2020-08-12 ENCOUNTER — Encounter (HOSPITAL_COMMUNITY): Payer: Self-pay | Admitting: Internal Medicine

## 2020-08-12 DIAGNOSIS — S91301A Unspecified open wound, right foot, initial encounter: Secondary | ICD-10-CM | POA: Diagnosis not present

## 2020-08-12 DIAGNOSIS — L02611 Cutaneous abscess of right foot: Secondary | ICD-10-CM

## 2020-08-12 DIAGNOSIS — I119 Hypertensive heart disease without heart failure: Secondary | ICD-10-CM

## 2020-08-12 DIAGNOSIS — S91301S Unspecified open wound, right foot, sequela: Secondary | ICD-10-CM

## 2020-08-12 HISTORY — PX: IRRIGATION AND DEBRIDEMENT FOOT: SHX6602

## 2020-08-12 LAB — GLUCOSE, CAPILLARY
Glucose-Capillary: 196 mg/dL — ABNORMAL HIGH (ref 70–99)
Glucose-Capillary: 131 mg/dL — ABNORMAL HIGH (ref 70–99)
Glucose-Capillary: 279 mg/dL — ABNORMAL HIGH (ref 70–99)
Glucose-Capillary: 183 mg/dL — ABNORMAL HIGH (ref 70–99)

## 2020-08-12 LAB — SURGICAL PCR SCREEN
MRSA, PCR: NEGATIVE
Staphylococcus aureus: POSITIVE — AB

## 2020-08-12 SURGERY — IRRIGATION AND DEBRIDEMENT FOOT
Anesthesia: Monitor Anesthesia Care | Laterality: Right

## 2020-08-12 MED ORDER — FENTANYL CITRATE (PF) 100 MCG/2ML IJ SOLN
INTRAMUSCULAR | Status: AC
Start: 2020-08-12 — End: 2020-08-13
  Filled 2020-08-12: qty 2

## 2020-08-12 MED ORDER — OXYCODONE HCL 5 MG PO TABS: 5.0000 mg | ORAL_TABLET | Freq: Once | ORAL | Status: DC | PRN

## 2020-08-12 MED ORDER — CHLORHEXIDINE GLUCONATE 0.12 % MT SOLN
OROMUCOSAL | Status: AC
  Administered 2020-08-12: 15 mL
  Filled 2020-08-12: qty 15

## 2020-08-12 MED ORDER — ACETAMINOPHEN 500 MG PO TABS: 1000.0000 mg | ORAL_TABLET | Freq: Once | ORAL | Status: DC | PRN

## 2020-08-12 MED ORDER — FENTANYL CITRATE (PF) 100 MCG/2ML IJ SOLN
25.0000 ug | INTRAMUSCULAR | Status: DC | PRN
  Administered 2020-08-12: 25 ug via INTRAVENOUS

## 2020-08-12 MED ORDER — PROPOFOL 500 MG/50ML IV EMUL
INTRAVENOUS | Status: DC | PRN
  Administered 2020-08-12: 75 ug/kg/min via INTRAVENOUS

## 2020-08-12 MED ORDER — LIDOCAINE HCL 2 % IJ SOLN
INTRAMUSCULAR | Status: AC
  Filled 2020-08-12: qty 20

## 2020-08-12 MED ORDER — LIDOCAINE HCL 2 % IJ SOLN
INTRAMUSCULAR | Status: DC | PRN
  Administered 2020-08-12: 13 mL

## 2020-08-12 MED ORDER — GENTAMICIN SULFATE 40 MG/ML IJ SOLN
INTRAMUSCULAR | Status: DC | PRN
  Administered 2020-08-12: 240 mg

## 2020-08-12 MED ORDER — BUPIVACAINE HCL (PF) 0.5 % IJ SOLN
INTRAMUSCULAR | Status: AC
  Filled 2020-08-12: qty 30

## 2020-08-12 MED ORDER — CHLORHEXIDINE GLUCONATE CLOTH 2 % EX PADS
6.0000 | MEDICATED_PAD | Freq: Every day | CUTANEOUS | Status: DC
  Administered 2020-08-12 – 2020-08-14 (×2): 6 via TOPICAL

## 2020-08-12 MED ORDER — MIDAZOLAM HCL 2 MG/2ML IJ SOLN
INTRAMUSCULAR | Status: AC
  Filled 2020-08-12: qty 2

## 2020-08-12 MED ORDER — ONDANSETRON HCL 4 MG/2ML IJ SOLN
INTRAMUSCULAR | Status: DC | PRN
  Administered 2020-08-12: 4 mg via INTRAVENOUS

## 2020-08-12 MED ORDER — BUPIVACAINE HCL (PF) 0.5 % IJ SOLN
INTRAMUSCULAR | Status: DC | PRN
  Administered 2020-08-12: 13 mL

## 2020-08-12 MED ORDER — GENTAMICIN SULFATE 40 MG/ML IJ SOLN
INTRAMUSCULAR | Status: AC
  Filled 2020-08-12: qty 4

## 2020-08-12 MED ORDER — LIDOCAINE 2% (20 MG/ML) 5 ML SYRINGE
INTRAMUSCULAR | Status: AC
  Filled 2020-08-12: qty 5

## 2020-08-12 MED ORDER — FENTANYL CITRATE (PF) 250 MCG/5ML IJ SOLN
INTRAMUSCULAR | Status: AC
  Filled 2020-08-12: qty 5

## 2020-08-12 MED ORDER — PHENYLEPHRINE 40 MCG/ML (10ML) SYRINGE FOR IV PUSH (FOR BLOOD PRESSURE SUPPORT)
PREFILLED_SYRINGE | INTRAVENOUS | Status: DC | PRN
  Administered 2020-08-12 (×2): 80 ug via INTRAVENOUS
  Administered 2020-08-12: 120 ug via INTRAVENOUS
  Administered 2020-08-12: 80 ug via INTRAVENOUS
  Administered 2020-08-12: 120 ug via INTRAVENOUS
  Administered 2020-08-12: 40 ug via INTRAVENOUS
  Administered 2020-08-12 (×2): 80 ug via INTRAVENOUS

## 2020-08-12 MED ORDER — OXYCODONE HCL 5 MG/5ML PO SOLN: 5.0000 mg | Freq: Once | ORAL | Status: DC | PRN

## 2020-08-12 MED ORDER — GENTAMICIN SULFATE 40 MG/ML IJ SOLN
INTRAMUSCULAR | Status: AC
  Filled 2020-08-12: qty 2

## 2020-08-12 MED ORDER — ACETAMINOPHEN 160 MG/5ML PO SOLN: 1000.0000 mg | Freq: Once | ORAL | Status: DC | PRN

## 2020-08-12 MED ORDER — ACETAMINOPHEN 10 MG/ML IV SOLN: 1000.0000 mg | Freq: Once | INTRAVENOUS | Status: DC | PRN

## 2020-08-12 MED ORDER — LIDOCAINE 2% (20 MG/ML) 5 ML SYRINGE
INTRAMUSCULAR | Status: DC | PRN
  Administered 2020-08-12: 60 mg via INTRAVENOUS

## 2020-08-12 MED ORDER — ONDANSETRON HCL 4 MG/2ML IJ SOLN
INTRAMUSCULAR | Status: AC
  Filled 2020-08-12: qty 2

## 2020-08-12 MED ORDER — MIDAZOLAM HCL 5 MG/5ML IJ SOLN
INTRAMUSCULAR | Status: DC | PRN
  Administered 2020-08-12: 2 mg via INTRAVENOUS

## 2020-08-12 MED ORDER — MUPIROCIN 2 % EX OINT
1.0000 "application " | TOPICAL_OINTMENT | Freq: Two times a day (BID) | CUTANEOUS | Status: DC
  Administered 2020-08-12 – 2020-08-16 (×9): 1 via NASAL
  Filled 2020-08-12 (×4): qty 22

## 2020-08-12 MED ORDER — POLYETHYLENE GLYCOL 3350 17 G PO PACK
17.0000 g | PACK | Freq: Two times a day (BID) | ORAL | Status: DC
  Administered 2020-08-12 – 2020-08-13 (×2): 17 g via ORAL
  Filled 2020-08-12 (×6): qty 1

## 2020-08-12 SURGICAL SUPPLY — 22 items
BNDG ELASTIC 4X5.8 VLCR STR LF (GAUZE/BANDAGES/DRESSINGS) ×3 IMPLANT
BNDG ESMARK 4X9 LF (GAUZE/BANDAGES/DRESSINGS) ×3 IMPLANT
BNDG GAUZE ELAST 4 BULKY (GAUZE/BANDAGES/DRESSINGS) ×6 IMPLANT
CNTNR URN SCR LID CUP LEK RST (MISCELLANEOUS) ×1 IMPLANT
CONT SPEC 4OZ STRL OR WHT (MISCELLANEOUS) ×2
CUFF TOURN SGL QUICK 18X4 (TOURNIQUET CUFF) ×3 IMPLANT
DRSG PAD ABDOMINAL 8X10 ST (GAUZE/BANDAGES/DRESSINGS) ×3 IMPLANT
GAUZE SPONGE 4X4 12PLY STRL (GAUZE/BANDAGES/DRESSINGS) ×3 IMPLANT
GAUZE XEROFORM 5X9 LF (GAUZE/BANDAGES/DRESSINGS) ×3 IMPLANT
IRRIGATOR SUCT 8 DISP DVNC XI (IRRIGATION / IRRIGATOR) ×1 IMPLANT
IRRIGATOR SUCTION 8MM XI DISP (IRRIGATION / IRRIGATOR) ×2
KIT BASIN OR (CUSTOM PROCEDURE TRAY) ×3 IMPLANT
KIT STIMULAN 5CC (Orthopedic Implant) ×3 IMPLANT
KIT TURNOVER KIT B (KITS) ×3 IMPLANT
MATRIX WOUND MESHED 2X2 (Tissue) ×1 IMPLANT
PACK ORTHO EXTREMITY (CUSTOM PROCEDURE TRAY) ×3 IMPLANT
PENCIL SMOKE EVAC W/HOLSTER (ELECTROSURGICAL) ×3 IMPLANT
SPONGE LAP 18X18 X RAY DECT (DISPOSABLE) ×6 IMPLANT
SUT MNCRL AB 3-0 PS2 18 (SUTURE) ×6 IMPLANT
SUT PROLENE 2 0 FS (SUTURE) ×3 IMPLANT
TOWEL GREEN STERILE (TOWEL DISPOSABLE) ×3 IMPLANT
WOUND MATRIX MESHED 2X2 (Tissue) ×2 IMPLANT

## 2020-08-12 NOTE — Plan of Care (Signed)

## 2020-08-12 NOTE — Anesthesia Preprocedure Evaluation (Addendum)
Anesthesia Evaluation  Patient identified by MRN, date of birth, ID band Patient awake    Reviewed: Allergy & Precautions, NPO status , Patient's Chart, lab work & pertinent test results  History of Anesthesia Complications Negative for: history of anesthetic complications  Airway Mallampati: I  TM Distance: >3 FB Neck ROM: Full    Dental  (+) Teeth Intact, Dental Advisory Given   Pulmonary neg pulmonary ROS,    breath sounds clear to auscultation       Cardiovascular hypertension, Pt. on home beta blockers  Rhythm:Regular     Neuro/Psych negative neurological ROS  negative psych ROS   GI/Hepatic negative GI ROS, Neg liver ROS,   Endo/Other  diabetes, Type 2, Oral Hypoglycemic AgentsHypothyroidism   Renal/GU Renal InsufficiencyRenal disease     Musculoskeletal negative musculoskeletal ROS (+)   Abdominal   Peds  Hematology negative hematology ROS (+)   Anesthesia Other Findings   Reproductive/Obstetrics                             Anesthesia Physical Anesthesia Plan  ASA: III  Anesthesia Plan: MAC   Post-op Pain Management:    Induction: Intravenous  PONV Risk Score and Plan: 1 and Treatment may vary due to age or medical condition and Propofol infusion  Airway Management Planned: Nasal Cannula  Additional Equipment: None  Intra-op Plan:   Post-operative Plan:   Informed Consent: I have reviewed the patients History and Physical, chart, labs and discussed the procedure including the risks, benefits and alternatives for the proposed anesthesia with the patient or authorized representative who has indicated his/her understanding and acceptance.     Dental advisory given  Plan Discussed with: CRNA and Surgeon  Anesthesia Plan Comments:         Anesthesia Quick Evaluation

## 2020-08-12 NOTE — Progress Notes (Signed)
Pharmacy Antibiotic Note  Dean Rasmussen is a 69 y.o. male admitted on 08/08/2020 with R-foot osteo.  Pharmacy has been consulted for Vancomycin and piperacillin-tazobactam dosing.   Renal function stable. Vancomycin levels ordered but not drawn 9/12 and 9/13. Abx doses given late on 9/13. Straph anginosus (pan S) and staph aureus growing in culture pending MIC and possible anaerobe.  Plan: Vancomycin 1g Q12 hr Pip/tazo 3.375g Q8 hr  Monitor cultures, clinical status, renal fx, duration   -If MRSA, f/u vanc trough on 9/14   -If MSSA, consider DC vancomycin    -If negative anaerobes, consider DC pip/tazo  Height: 5\' 10"  (177.8 cm) Weight: 81.6 kg (180 lb) IBW/kg (Calculated) : 73  Temp (24hrs), Avg:98.6 F (37 C), Min:98 F (36.7 C), Max:98.8 F (37.1 C)  Recent Labs  Lab 08/08/20 2055 08/09/20 0919 08/09/20 1350 08/10/20 0628 08/11/20 0431  WBC 21.4* 23.1* 19.5* 15.1* 11.7*  CREATININE 1.12  --  1.13 1.05 1.17  LATICACIDVEN 1.2  --   --   --   --     Estimated Creatinine Clearance: 61.5 mL/min (by C-G formula based on SCr of 1.17 mg/dL).    No Known Allergies  Antimicrobials this admission: Zosyn 9/10 >> Vanc 9/10 >> Rocephin 9/10 x 1 Flagyl 9/10 x 1  Microbiology results: 9/9 Bcx x2: NGTD 9/9 COVID: negative 9/10 Wound Cx: staph anginosis (pan S), staph aureus, MIC pending, possible anaerobe  9/13 MRSA +  9/13 wound abscess: pend   Thank you for allowing pharmacy to be a part of this patient's care.  10/13, PharmD, BCPS, BCCP Clinical Pharmacist  Please check AMION for all [facility] Pharmacy phone numbers After 10:00 PM, call Main Pharmacy [phone]

## 2020-08-12 NOTE — Op Note (Signed)
Patient Name: Dean Rasmussen DOB: 1950/12/10  MRN: [id number]   Date of Service: 08/08/2020 - 08/12/2020  Surgeon: Dr. Lanae Crumbly, DPM Assistants: None Pre-operative Diagnosis:  #1 diabetic foot infection right foot #2 Charcot neuroarthropathy midtarsal joint right foot #3 cellulitis and abscess of right foot Post-operative Diagnosis:  #1 diabetic foot infection right foot #2 Charcot neuroarthropathy midtarsal joint right foot #3 cellulitis and abscess of right foot Procedures: #1 debridement of subcutaneous tissue, fascia, muscle and bone right foot 29.25cm #2 removal nonbiodegradable drug delivery implant #3 complex layered secondary closure surgical wound right foot #4 preparation of wound bed less than 25 cm #5 application skin substitute first 100 cm Pathology/Specimens: ID Type Source Tests Collected by Time Destination  A : Right foot wound pre-wash Abscess Wound AEROBIC/ANAEROBIC CULTURE (SURGICAL/DEEP WOUND) Criselda Peaches, DPM 08/12/2020 1054   B : Right foot wound post-wash Abscess Wound AEROBIC/ANAEROBIC CULTURE (SURGICAL/DEEP WOUND) Criselda Peaches, DPM 08/12/2020 1055    Anesthesia: Monitored anesthesia care with IV sedation and local anesthesia Hemostasis:  Total Tourniquet Time Documented: Calf (Right) - 42 minutes Total: Calf (Right) - 42 minutes  Estimated Blood Loss: 25 mL Materials:  Implant Name Type Inv. Item Serial No. Manufacturer Lot No. LRB No. Used Action  KIT STIMULAN 5CC - [id number] Orthopedic Implant KIT STIMULAN 5CC  [employer] [id number] Right 1 Implanted  WOUND MATRIX MESHED 2X2 - [id number] Tissue WOUND MATRIX MESHED 2X2  [employer] [id number] Right 1 Implanted   Medications: 240 mg gentamicin mixed with the stimulation beads, 27 cc of 50-50 mix of 2% Xylocaine plain and 0.5% Marcaine plain in ankle block Complications: None  Indications for Procedure:  This is a 69 y.o. male with a history of a severe diabetic foot infection on  the right foot.  He was admitted to the hospital on 08/08/2020, and taken to the operating room by myself on 08/09/2020 for a severe right foot diabetic foot gas-forming infection.  He was taken back to the operating room today for secondary washout and closure   Procedure in Detail: Patient was identified in pre-operative holding area. Formal consent was signed and the right lower extremity was marked. Patient was brought back to the operating room. Anesthesia was induced.  Right lower extremity was anesthetized and an ankle block fashion with 27 cc total of 2% Xylocaine plain and 0.5% Marcaine plain in a 50-50 mixture.  The extremity was prepped and draped in the usual sterile fashion. Timeout was taken to confirm patient name, laterality, and procedure prior to incision.   Attention was then directed to the right foot where his open incisions on the medial dorsal and plantar foot were exposed.  The dorsal medial skin of the right foot was noted to have a significant amount of necrosis that was not present on my last exam on Saturday 9/11.  The antibiotic PMMA beads on Prolene were completely removed, 14 in total.  The wound is manually explored and a small (2 to 3 cc) amount of purulence from the dorsal stab incision was encountered.  This incision was extended and manually explored in the dorsal superficial subcutaneous compartment.  The area was irrigated.  The deep fascia here was incised and the area was again irrigated.  No further purulence was found.  Wound culture of the site was taken.  All incisions were then thoroughly irrigated with 3 L normal sterile saline via pulse lavage irrigator.  I then directed my attention to the prominent plantar bone of the first  metatarsal base and medial cuneiform.  The abductor hallucis muscle was left in its proximal attachments and retracted plantarly to expose the bone.  The bone was palpated and the prominent areas were then resected and debrided using a  high-speed 4 mm egg burr.  The wound was again thoroughly irrigated.  A second culture was taken of the bony surface of the base of the first metatarsal following debridement.  The bony surface of debridement appeared healthy and was bleeding all wounds were then thoroughly irrigated once more with saline.  Complex layered closure was then initiated.  The original site of the plantar medial ulceration was excised full-thickness and the anterior and dorsal skin flap was advanced proximally to close the wound.  The area of necrosis here was then excised until bleeding skin margin was encountered.  I completed as much of the complex layered closure as I could using 2-0 PDS which was used to advance the abductor hallucis muscle over the exposed bone and was sewn to the deep fascia.  Subcutaneous tissue was closed with 3-0 Monocryl.  And skin was closed with 2-0 and 3-0 Prolene.  At this point there remained a large skin defect to the level of the subcutaneous tissue measuring 4.5 cm x 6.5 cm.  It was determined that a skin substitute would be required for secondary closure of the wound and eventual skin grafting.  A 2 inch x 2 inch Integra bilayer meshed wound matrix was then opened and placed into the wound bed and secured with skin staples.  The foot was then dressed with Xeroform, a gauze bolster dressing over the Integra graft, ABD pads, Kerlix and an Ace wrap under light compression. Patient tolerated the procedure well.   Disposition: Following a period of post-operative monitoring, patient will be transferred back to the floor.  He will remain nonweightbearing to the right lower extremity.  No further surgical plans currently.Marland Kitchen

## 2020-08-12 NOTE — Brief Op Note (Signed)
08/12/2020  12:15 PM  PATIENT:  Dean Rasmussen  69 y.o. male  PRE-OPERATIVE DIAGNOSIS:  right diabetic foot infection, Charcot  POST-OPERATIVE DIAGNOSIS:  right diabetic foot infection, Charcot  PROCEDURE:  Procedure(s): Secondary wound closure and Removal of antibiotic beads (Right) Prep wound bed 1.6XW x 9.6EA  Application skin substitute   SURGEON:  Surgeon(s) and Role:    * Fabiano Ginley, Stephan Minister, DPM - Primary  PHYSICIAN ASSISTANT:   ASSISTANTS: none   ANESTHESIA:   local and MAC  EBL:  25 mL   BLOOD ADMINISTERED:none  DRAINS: none   LOCAL MEDICATIONS USED:  BUPIVICAINE  and XYLOCAINE 27cc 50/50 mix   SPECIMEN:  Source of Specimen:  wound culture pre lavage, bone culture post lavage  DISPOSITION OF SPECIMEN:  microbiology  COUNTS:  YES  TOURNIQUET:   Total Tourniquet Time Documented: Calf (Right) - 42 minutes Total: Calf (Right) - 42 minutes   DICTATION: .Note written in EPIC  PLAN OF CARE: Admit to inpatient   PATIENT DISPOSITION:  PACU - hemodynamically stable.   Delay start of Pharmacological VTE agent (>24hrs) due to surgical blood loss or risk of bleeding: yes

## 2020-08-12 NOTE — Anesthesia Procedure Notes (Signed)
Procedure Name: MAC Date/Time: 08/12/2020 10:23 AM Performed by: Jenne Campus, CRNA Pre-anesthesia Checklist: Patient identified, Emergency Drugs available, Suction available and Patient being monitored Oxygen Delivery Method: Simple face mask

## 2020-08-12 NOTE — Transfer of Care (Signed)
Immediate Anesthesia Transfer of Care Note  Patient: Dean Rasmussen  Procedure(s) Performed: Secondary wound closure and removal of antibiotic beads (Right )  Patient Location: PACU  Anesthesia Type:MAC  Level of Consciousness: oriented, drowsy and patient cooperative  Airway & Oxygen Therapy: Patient Spontanous Breathing and Patient connected to face mask oxygen  Post-op Assessment: Report given to RN and Post -op Vital signs reviewed and stable  Post vital signs: Reviewed  Last Vitals:  Vitals Value Taken Time  BP    Temp    Pulse    Resp    SpO2      Last Pain:  Vitals:   08/12/20 0956  TempSrc:   PainSc: 0-No pain      Patients Stated Pain Goal: 2 (24/46/28 6381)  Complications: No complications documented.

## 2020-08-12 NOTE — Progress Notes (Signed)
   08/12/20 0943  Clinical Encounter Type  Visited With Patient  Visit Type Follow-up  Referral From Nurse  Consult/Referral To Chaplain  Spiritual Encounters  Spiritual Needs Ritual  Chaplain followed up on Pt consult.  Pt requested to see a [employer].  Pt was in surgery, Chaplain will follow up nurse to see when will be a good time to talk with Pt.

## 2020-08-12 NOTE — Progress Notes (Signed)
PROGRESS NOTE    Dean Rasmussen  MVE:[id number] DOB: 09-08-1951 DOA: 08/08/2020 PCP: Patient, [name]    Brief Narrative:  Patient admitted with right foot abscess, possible osteomyelitis.  69 year old male with past medical history for uncontrolled type 2 diabetes mellitus and hypertension.  Patient reported about 3-week history of erythema and edema at the right midfoot, rapidly progressed and worsening.  Outpatient evaluation with an MRI showed large abscess with possible osteomyelitis.  On his initial physical examination blood pressure 137/81, heart rate 94, respiratory 22, temperature 98.4, oxygen saturation 93%, his lungs are clear to auscultation bilaterally, heart S1-S2, present rhythmic, his abdomen was soft, no lower extremity edema, right midfoot had a 10 cm soft tissue edema, with erythema and fluctuance. Sodium 136, potassium 4.0, chloride 101, bicarb 26, glucose 301, BUN 32, creatinine 1.12, white count 21.4, hemoglobin 12.0, hematocrit 36.8, platelets 240.  SARS COVID-19 negative.  Right foot MRI with focal soft tissue ulceration at the plantar aspect of the right foot underlying the level of the first tarsometatarsal joint with sinus tract extending onto the plantar aspect of the first TMT joint.  Subtle signal changes within the plantar aspect of the first metatarsal base and medial cuneiform compatible with acute osteomyelitis.  Possible septic arthritis of the first TMT joint.  Large fluid entered continue collection along the medial and dorsal aspect of midfoot measuring up to 6.7 cm.  Patient was placed on broad spectrum antibiotic therapy, underwent I&D with placement of antibiotic beads along with bone biopsy on 9/10.  Today underwent right foot secondary wound closure and removal of antibiotic beads, Prep wound bed 6.5 cm x 4.5 cm application of skin substitute.    Assessment & Plan:   Principal Problem:   Osteomyelitis (HCC) Active Problems:   Essential  hypertension   Hypertensive left ventricular hypertrophy, without heart failure   Type 2 diabetes mellitus without complication, without long-term current use of insulin (HCC)   Ventricular ectopy   Abscess of right foot   1. Diabetic foot abscess with suspected osteomyelitis. Sp I&D/ biopsy with antibiotic beads and secondary wound closure. Wbc trending down to 11.7, she has been afebrile. Foot cultures positive for staphylococcus ([facility]SA) and streptococcus.   Continue antibiotic therapy with vancomycin. Continue pain control and wound care. Follow up with physical and occupational therapy, patient will be non weight bearing to his right foot.   Follow up with biopsy results.   2, Uncontrolled T2DM Hgb A1c 9,0.  Fasting glucose is 236, capillary 219, 194, 196, 131. Patient is tolerating po well.   Patient placed on insulin glargine last night 10 units, will continue glucose cover and monitoring with insulin sliding scale.  Continue with linagliptine.   3. HTN. Continue blood pressure control with metoprolol.   4. Hypothyroid. Continue with levothyroxine.   5. Constipation. Will add bid miralax.    Status is: Inpatient  Remains inpatient appropriate because:IV treatments appropriate due to intensity of illness or inability to take PO   Dispo: The patient is from: Home              Anticipated d/c is to: Home              Anticipated d/c date is: 2 days              Patient currently is not medically stable to d/c.    DVT prophylaxis: Enoxaparin   Code Status:   full  Family Communication:  No family at the bedside  Consultants:   Podiatry   Procedures:   Right foot I&D  Antimicrobials:   Vancomycin     Subjective: Patient with pain right foot controlled with analgesics, continue to have poor mobility and elevated glucose, no nausea or vomiting, no chest pain or dyspnea,.   Objective: Vitals:   08/12/20 1230 08/12/20 1245 08/12/20 1300 08/12/20 1315    BP: (!) 151/94 (!) 150/96 (!) 151/96 (!) 162/99  Pulse: 86 76 80 85  Resp: 16 (!) 23 (!) 22   Temp:      TempSrc:      SpO2: 100% 98% 98% 100%  Weight:      Height:        Intake/Output Summary (Last 24 hours) at 08/12/2020 1333 Last data filed at 08/12/2020 1204 Gross per 24 hour  Intake 1000 ml  Output 825 ml  Net 175 ml   Filed Weights   08/09/20 1128  Weight: 81.6 kg    Examination:   General: Not in pain or dyspnea. Deconditioned  Neurology: Awake and alert, non focal  E ENT: no pallor, no icterus, oral mucosa moist Cardiovascular: No JVD. S1-S2 present, rhythmic, no gallops, rubs, or murmurs. No lower extremity edema. Pulmonary: positive breath sounds bilaterally, adequate air movement, no wheezing, rhonchi or rales. Gastrointestinal. Abdomen soft and non tender Skin. No rashes Musculoskeletal: right foot with dressing in place.      Data Reviewed: I have personally reviewed following labs and imaging studies  CBC: Recent Labs  Lab 08/08/20 2055 08/09/20 0919 08/09/20 1350 08/10/20 0628 08/11/20 0431  WBC 21.4* 23.1* 19.5* 15.1* 11.7*  NEUTROABS 19.9*  --   --   --   --   HGB 12.0* 13.7 12.1* 11.3* 10.8*  HCT 36.8* 42.2 36.9* 33.7* 32.2*  MCV 94.4 95.9 94.4 93.1 94.7  PLT 240 241 211 198 187   Basic Metabolic Panel: Recent Labs  Lab 08/08/20 2055 08/09/20 1350 08/10/20 0628 08/11/20 0431  NA 136  --  134* 136  K 4.0  --  3.8 3.8  CL 101  --  100 101  CO2 26  --  25 29  GLUCOSE 301*  --  214* 236*  BUN 32*  --  18 17  CREATININE 1.12 1.13 1.05 1.17  CALCIUM 8.9  --  8.3* 8.3*   GFR: Estimated Creatinine Clearance: 61.5 mL/min (by C-G formula based on SCr of 1.17 mg/dL). Liver Function Tests: Recent Labs  Lab 08/08/20 2055 08/10/20 0628  AST 17 12*  ALT 24 16  ALKPHOS 58 53  BILITOT 0.9 1.0  PROT 6.5 5.2*  ALBUMIN 3.3* 2.4*   No results for input(s): LIPASE, AMYLASE in the last 168 hours. No results for input(s): AMMONIA in the  last 168 hours. Coagulation Profile: Recent Labs  Lab 08/08/20 2055 08/09/20 0919  INR 1.0 1.0   Cardiac Enzymes: No results for input(s): CKTOTAL, CKMB, CKMBINDEX, TROPONINI in the last 168 hours. BNP (last 3 results) No results for input(s): PROBNP in the last 8760 hours. HbA1C: Recent Labs    08/09/20 1400  HGBA1C 9.0*   CBG: Recent Labs  Lab 08/11/20 1206 08/11/20 1839 08/11/20 2046 08/12/20 0635 08/12/20 1230  GLUCAP 283* 219* 194* 196* 131*   Lipid Profile: No results for input(s): CHOL, HDL, LDLCALC, TRIG, CHOLHDL, LDLDIRECT in the last 72 hours. Thyroid Function Tests: No results for input(s): TSH, T4TOTAL, FREET4, T3FREE, THYROIDAB in the last 72 hours. Anemia Panel: No results for input(s): VITAMINB12, FOLATE, FERRITIN, TIBC, IRON,  RETICCTPCT in the last 72 hours.    Radiology Studies: I have reviewed all of the imaging during this hospital visit personally     Scheduled Meds: . Chlorhexidine Gluconate Cloth  6 each Topical Q0600  . enoxaparin (LOVENOX) injection  40 mg Subcutaneous Q24H  . fentaNYL      . insulin aspart  0-15 Units Subcutaneous TID WC  . insulin glargine  10 Units Subcutaneous QHS  . levothyroxine  25 mcg Oral Q0600  . linagliptin  5 mg Oral Daily  . metoprolol succinate  100 mg Oral Daily  . mupirocin ointment  1 application Nasal BID  . sodium chloride flush  3 mL Intravenous Q12H   Continuous Infusions: . sodium chloride 250 mL (08/09/20 2044)  . lactated ringers 10 mL/hr at 08/12/20 1017  . piperacillin-tazobactam     Followed by  . piperacillin-tazobactam (ZOSYN)  IV 3.375 g (08/12/20 3552)  . vancomycin 1,000 mg (08/12/20 0528)     LOS: 3 days        Ishitha Roper Annett Gula, MD

## 2020-08-12 NOTE — Plan of Care (Signed)
  Problem: Pain Managment: Goal: General experience of comfort will improve Outcome: Progressing   Problem: Safety: Goal: Ability to remain free from injury will improve Outcome: Progressing   Problem: Skin Integrity: Goal: Risk for impaired skin integrity will decrease Outcome: Progressing   

## 2020-08-13 ENCOUNTER — Encounter (HOSPITAL_COMMUNITY): Payer: Self-pay | Admitting: Podiatry

## 2020-08-13 DIAGNOSIS — I493 Ventricular premature depolarization: Secondary | ICD-10-CM

## 2020-08-13 DIAGNOSIS — S91301A Unspecified open wound, right foot, initial encounter: Secondary | ICD-10-CM

## 2020-08-13 LAB — CBC WITH DIFFERENTIAL/PLATELET
Abs Immature Granulocytes: 0.06 10*3/uL (ref 0.00–0.07)
Abs Immature Granulocytes: 0.06 10*3/uL (ref 0.00–0.07)
Basophils Absolute: 0 10*3/uL (ref 0.0–0.1)
Basophils Absolute: 0 10*3/uL (ref 0.0–0.1)
Basophils Relative: 0 %
Basophils Relative: 0 %
Eosinophils Absolute: 0.1 10*3/uL (ref 0.0–0.5)
Eosinophils Absolute: 0.1 10*3/uL (ref 0.0–0.5)
Eosinophils Relative: 1 %
Eosinophils Relative: 1 %
HCT: 31.3 % — ABNORMAL LOW (ref 39.0–52.0)
HCT: 34 % — ABNORMAL LOW (ref 39.0–52.0)
Hemoglobin: 10.2 g/dL — ABNORMAL LOW (ref 13.0–17.0)
Hemoglobin: 11.1 g/dL — ABNORMAL LOW (ref 13.0–17.0)
Immature Granulocytes: 1 %
Immature Granulocytes: 1 %
Lymphocytes Relative: 10 %
Lymphocytes Relative: 10 %
Lymphs Abs: 0.9 10*3/uL (ref 0.7–4.0)
Lymphs Abs: 0.9 10*3/uL (ref 0.7–4.0)
MCH: 30.4 pg (ref 26.0–34.0)
MCH: 30.4 pg (ref 26.0–34.0)
MCHC: 32.6 g/dL (ref 30.0–36.0)
MCHC: 32.6 g/dL (ref 30.0–36.0)
MCV: 93.4 fL (ref 80.0–100.0)
MCV: 93.2 fL (ref 80.0–100.0)
Monocytes Absolute: 0.6 10*3/uL (ref 0.1–1.0)
Monocytes Absolute: 0.5 10*3/uL (ref 0.1–1.0)
Monocytes Relative: 7 %
Monocytes Relative: 5 %
Neutro Abs: 7.6 10*3/uL (ref 1.7–7.7)
Neutro Abs: 7.7 10*3/uL (ref 1.7–7.7)
Neutrophils Relative %: 81 %
Neutrophils Relative %: 83 %
Platelets: 181 10*3/uL (ref 150–400)
Platelets: 207 10*3/uL (ref 150–400)
RBC: 3.35 MIL/uL — ABNORMAL LOW (ref 4.22–5.81)
RBC: 3.65 MIL/uL — ABNORMAL LOW (ref 4.22–5.81)
RDW: 12.4 % (ref 11.5–15.5)
RDW: 12.4 % (ref 11.5–15.5)
WBC: 9.3 10*3/uL (ref 4.0–10.5)
WBC: 9.3 10*3/uL (ref 4.0–10.5)
nRBC: 0 % (ref 0.0–0.2)
nRBC: 0 % (ref 0.0–0.2)

## 2020-08-13 LAB — CULTURE, BLOOD (ROUTINE X 2)
Culture: NO GROWTH
Culture: NO GROWTH
Special Requests: ADEQUATE

## 2020-08-13 LAB — GLUCOSE, CAPILLARY
Glucose-Capillary: 184 mg/dL — ABNORMAL HIGH (ref 70–99)
Glucose-Capillary: 265 mg/dL — ABNORMAL HIGH (ref 70–99)
Glucose-Capillary: 224 mg/dL — ABNORMAL HIGH (ref 70–99)
Glucose-Capillary: 175 mg/dL — ABNORMAL HIGH (ref 70–99)

## 2020-08-13 LAB — SURGICAL PATHOLOGY

## 2020-08-13 LAB — BASIC METABOLIC PANEL
Anion gap: 7 (ref 5–15)
BUN: 13 mg/dL (ref 8–23)
CO2: 30 mmol/L (ref 22–32)
Calcium: 8.3 mg/dL — ABNORMAL LOW (ref 8.9–10.3)
Chloride: 98 mmol/L (ref 98–111)
Creatinine, Ser: 1.13 mg/dL (ref 0.61–1.24)
GFR calc Af Amer: >60 mL/min (ref >60)
GFR calc non Af Amer: >60 mL/min (ref >60)
Glucose, Bld: 211 mg/dL — ABNORMAL HIGH (ref 70–99)
Potassium: 3.8 mmol/L (ref 3.5–5.1)
Sodium: 135 mmol/L (ref 135–145)

## 2020-08-13 MED ORDER — INSULIN ASPART 100 UNIT/ML ~~LOC~~ SOLN
5.0000 [IU] | Freq: Three times a day (TID) | SUBCUTANEOUS | Status: DC
  Administered 2020-08-13 – 2020-08-16 (×9): 5 [IU] via SUBCUTANEOUS

## 2020-08-13 MED ORDER — METOPROLOL SUCCINATE ER 50 MG PO TB24: 50.0000 mg | ORAL_TABLET | Freq: Every day | ORAL | Status: DC

## 2020-08-13 MED ORDER — APIXABAN 5 MG PO TABS
5.0000 mg | ORAL_TABLET | Freq: Two times a day (BID) | ORAL | Status: DC
  Administered 2020-08-13 – 2020-08-14 (×2): 5 mg via ORAL
  Filled 2020-08-13 (×2): qty 1

## 2020-08-13 MED ORDER — METFORMIN HCL 500 MG PO TABS
1000.0000 mg | ORAL_TABLET | Freq: Two times a day (BID) | ORAL | Status: DC
  Administered 2020-08-13 – 2020-08-15 (×4): 1000 mg via ORAL
  Filled 2020-08-13 (×5): qty 2

## 2020-08-13 MED ORDER — CEFAZOLIN SODIUM-DEXTROSE 2-4 GM/100ML-% IV SOLN
2.0000 g | Freq: Three times a day (TID) | INTRAVENOUS | Status: DC
  Administered 2020-08-13 – 2020-08-14 (×3): 2 g via INTRAVENOUS
  Filled 2020-08-13 (×3): qty 100

## 2020-08-13 NOTE — Progress Notes (Addendum)
Pharmacy Antibiotic Note  Gerald Kuehl is a 69 y.o. male admitted on 08/08/2020 with R-foot osteomeylitis.  Pharmacy has been consulted for cefazolin dosing.   Renal function stable. Vancomycin levels ordered but not drawn 9/12 and 9/13. Abx doses given late on 9/13. Straph anginosus (pan S) and MSSA growing in cultures, possible anaerobe. Per discussion with MD, narrow antibiotics and hold off on anaerobic coverage for now given patient is doing well.   Plan: Stop Vancomycin and Pip/tazo  Start cefazolin 2g Q 8hr Monitor cultures, clinical status, renal fx, duration (likely 6 wks) If anaerobic culture is positive, add metronidazole   Height: 5\' 10"  (177.8 cm) Weight: 81.6 kg (180 lb) IBW/kg (Calculated) : 73  Temp (24hrs), Avg:98.8 F (37.1 C), Min:97.7 F (36.5 C), Max:100.9 F (38.3 C)  Recent Labs  Lab 08/08/20 2055 08/08/20 2055 08/09/20 0919 08/09/20 1350 08/10/20 0628 08/11/20 0431 08/13/20 0517  WBC 21.4*   < > 23.1* 19.5* 15.1* 11.7* 9.3  CREATININE 1.12  --   --  1.13 1.05 1.17 1.13  LATICACIDVEN 1.2  --   --   --   --   --   --    < > = values in this interval not displayed.    Estimated Creatinine Clearance: 63.7 mL/min (by C-G formula based on SCr of 1.13 mg/dL).    No Known Allergies  Antimicrobials this admission: Cefazolin 9/14>>  Zosyn 9/10 >>9/14 Vanc 9/10 >>9/14 Rocephin 9/10 x 1 Flagyl 9/10 x 1  Microbiology results: 9/9 Bcx x2: NGTD 9/9 COVID: negative 9/10 RLE abscess: staph anginosis(S- PCN, Ctx), MSSA, possible anaerobe  9/10 Bone: MSSA  9/13 MRSA +  9/13 wound abscess: ngtd   Thank you for allowing pharmacy to be a part of this patient's care.  10/13, PharmD, BCPS, BCCP Clinical Pharmacist  Please check AMION for all [facility] phone numbers After 10:00 PM, call Main Pharmacy [phone]

## 2020-08-13 NOTE — Progress Notes (Signed)
Subjective: POD #1 s/p wound debridement, removal of antibiotic beads, delayed primary closure with application of skin substitute. He gets some intermittent pain which is controlled with Vicodin. He would like to work with PT more and does not feel that he is ready to go home. Denies any systemic complaints such as fevers, chills, nausea, vomiting. Last fever was last night  Objective: AAO x3, NAD DP/PT pulses palpable bilaterally, CRT less than 3 seconds RLE: Bandage clean, dry, intact without any strike through.   LLE without any ulceration  No pain with calf compression, swelling, warmth, erythema  Assessment: POD #1 s/p return to the OR for wound debridement, closure, graft application  Plan: Patient has mild pain but is afebrile and WBC improved. Will plan to continue IV antibiotics for now. Follow culture for antibiotics for anticipated discharge.Bone culture from 08/09/2020 shows few staph aureus.  Although path is negative for osteomyelitis would follow bone culture. Recommend ID consult. Continue PT, NWB RLE. Podiatry will continue to follow  Ovid Curd, DPM

## 2020-08-13 NOTE — Evaluation (Signed)
Occupational Therapy Evaluation Patient Details Name: Dean Rasmussen MRN: [id number] DOB: 1951-11-30 Today's Date: 08/13/2020    History of Present Illness 69 year old male with past medical history for uncontrolled type 2 diabetes mellitus and hypertension.  Patient reported about 3-week history of erythema and edema at the right midfoot, rapidly progressed and worsening.  Outpatient evaluation with an MRI showed large abscess with possible osteomyelitis. s/p surgical debridement 08/12/20.   Clinical Impression   PTA, pt lives with wife and reports Independence with all ADLs, IADLs, driving and mobility without use of AD. Pt presents now with diagnoses above and mild deficits in strength, standing balance, endurance, and pain. Pt reporting more pain in L shoulder (hx of rotator cuff) than in surgical R foot today. Trialed RW and knee scooter with pt requiring Min A for maneuvering DME with cues for safe sequencing. Pt motivated to participate in therapy. Pt requires Setup for UB ADLs and Min A for LB ADLs due to deficits. Pt's wife is a Engineer, civil (consulting), works from home, and can assist pt as needed. Recommend RW for pivots and mobility in small spaces and knee scooter for longer distances. Also recommend tub transfer bench and HHOT services for follow-up. Will continue to monitor and update recommendations as needed.     Follow Up Recommendations  Home health OT (Supervision for OOB activities and mobility )    Equipment Recommendations  Tub/shower bench;Other (comment) (RW, knee scooter)    Recommendations for Other Services       Precautions / Restrictions Precautions Precautions: Fall Precaution Comments: low fall Restrictions Weight Bearing Restrictions: Yes RLE Weight Bearing: Non weight bearing      Mobility Bed Mobility Overal bed mobility: Needs Assistance Bed Mobility: Supine to Sit     Supine to sit: Supervision;HOB elevated     General bed mobility comments: Supervision with  increased time/effort. Minor cues for maintaining R foot safety/NWB  Transfers Overall transfer level: Needs assistance Equipment used: Rolling walker (2 wheeled);None (knee scooter) Transfers: Sit to/from [employer] Sit to Stand: Min guard Stand pivot transfers: Min assist       General transfer comment: min guard for steadying and cues for hand placement during transitional movements. Pt overall Min A for pivot transfer with RW, mounting/dismounting knee scooter, and turning with knee scooter with minor assistance for DME advancement and maintaining balance    Balance Overall balance assessment: Needs assistance Sitting-balance support: No upper extremity supported;Feet supported Sitting balance-Leahy Scale: Good     Standing balance support: During functional activity;Single extremity supported Standing balance-Leahy Scale: Poor Standing balance comment: reliant on at least one UE support during dynamic tasks                           ADL either performed or assessed with clinical judgement   ADL Overall ADL's : Needs assistance/impaired Eating/Feeding: Independent;Sitting   Grooming: Min guard;Standing   Upper Body Bathing: Set up;Sitting   Lower Body Bathing: Minimal assistance;Sit to/from stand   Upper Body Dressing : Set up;Sitting Upper Body Dressing Details (indicate cue type and reason): Setup to don hospital gown around back Lower Body Dressing: Minimal assistance;Sit to/from stand Lower Body Dressing Details (indicate cue type and reason): Pt able to don L sock with increased time and figure four position. Anticipate Min A for dressing in standing due to NWB status and mild balance deficits Toilet Transfer: Minimal assistance;Ambulation;BSC;RW (RW or knee scooter) Statistician Details (indicate cue type  and reason): Pt Min A for pivot from bed to chair with RW to maintain balance (posterior bias when backing up to chair). Pt Min A  overall for mobilizing with knee scooter in room (helpful for longer distances due to L shoulder pain) Toileting- Clothing Manipulation and Hygiene: Minimal assistance;Sit to/from stand       Functional mobility during ADLs: Minimal assistance;Cueing for safety;Cueing for sequencing;Rolling walker (knee scooter) General ADL Comments: Pt with mild deficits in standing balance, endurance, and strength. Pt more limited by L shoulder pain than R foot pain (surgical site).      Vision Baseline Vision/History: No visual deficits Vision Assessment?: No apparent visual deficits     Perception     Praxis      Pertinent Vitals/Pain Pain Assessment: Faces Faces Pain Scale: Hurts little more Pain Location: L shoulder more than R foot Pain Descriptors / Indicators: Pressure;Grimacing;Guarding Pain Intervention(s): Monitored during session;Limited activity within patient's tolerance;Repositioned     Hand Dominance Right   Extremity/Trunk Assessment Upper Extremity Assessment Upper Extremity Assessment: LUE deficits/detail LUE Deficits / Details: hx of rotator cuff injury, < 5* active shoulder flexion or abduction, PROM to 90* shoulder flexion LUE Sensation: WNL   Lower Extremity Assessment Lower Extremity Assessment: Defer to PT evaluation   Cervical / Trunk Assessment Cervical / Trunk Assessment: Normal   Communication Communication Communication: No difficulties   Cognition Arousal/Alertness: Awake/alert Behavior During Therapy: WFL for tasks assessed/performed Overall Cognitive Status: Within Functional Limits for tasks assessed                                     General Comments  Provided education on helpful DME in the home, safety with ADLs including use of tub bench and protecting surgical foot during bathing tasks    Exercises     Shoulder Instructions      Home Living Family/patient expects to be discharged to:: Private residence Living  Arrangements: Spouse/significant other;Children Available Help at Discharge: Family;Available 24 hours/day (wife is a Engineer, civil (consulting) and works from home) Type of Home: Apartment Home Access: Level entry;Other (comment) (slight incline sidewalk up to apartment)     Home Layout: One level     Bathroom Shower/Tub: Chief Strategy Officer: Standard         Additional Comments: Just moved to [place] this week      Prior Functioning/Environment Level of Independence: Independent        Comments: Independent with all ADLs, iADLs and mobility without AD. Pt drives, semi retired with Education administrator work.         OT Problem List: Decreased strength;Decreased range of motion;Decreased activity tolerance;Impaired balance (sitting and/or standing);Decreased knowledge of use of DME or AE;Pain;Impaired UE functional use      OT Treatment/Interventions: Self-care/ADL training;Therapeutic exercise;Energy conservation;DME and/or AE instruction;Therapeutic activities;Patient/family education    OT Goals(Current goals can be found in the care plan section) Acute Rehab OT Goals Patient Stated Goal: go home safely and with needed DME OT Goal Formulation: With patient Time For Goal Achievement: 08/27/20 Potential to Achieve Goals: Good ADL Goals Pt Will Perform Grooming: with modified independence;standing Pt Will Perform Lower Body Bathing: with modified independence;sit to/from stand Pt Will Perform Lower Body Dressing: with modified independence;sit to/from stand;sitting/lateral leans Pt Will Transfer to Toilet: with modified independence;ambulating;bedside commode Pt Will Perform Toileting - Clothing Manipulation and hygiene: with modified independence;sitting/lateral leans;sit to/from stand Pt  Will Perform Tub/Shower Transfer: Tub transfer;with set-up;ambulating;tub bench Pt/caregiver will Perform Home Exercise Program: Left upper extremity;Independently;With written HEP provided  OT  Frequency: Min 2X/week   Barriers to D/C:            Co-evaluation              AM-PAC OT "6 Clicks" Daily Activity     Outcome Measure Help from another person eating meals?: None Help from another person taking care of personal grooming?: A Little Help from another person toileting, which includes using toliet, bedpan, or urinal?: A Little Help from another person bathing (including washing, rinsing, drying)?: A Little Help from another person to put on and taking off regular upper body clothing?: A Little Help from another person to put on and taking off regular lower body clothing?: A Little 6 Click Score: 19   End of Session Equipment Utilized During Treatment: Gait belt;Rolling walker;Other (comment) (knee scooter) Nurse Communication: Mobility status  Activity Tolerance: Patient tolerated treatment well Patient left: in chair;with call bell/phone within reach  OT Visit Diagnosis: Unsteadiness on feet (R26.81);Other abnormalities of gait and mobility (R26.89);Muscle weakness (generalized) (M62.81);Pain Pain - Right/Left: Left (Left shoulder, Right foot) Pain - part of body: Shoulder;Ankle and joints of foot (L shoulder, R foot)                Time: 7915-0569 OT Time Calculation (min): 41 min Charges:  OT General Charges $OT Visit: 1 Visit OT Evaluation $OT Eval Moderate Complexity: 1 Mod OT Treatments $Self Care/Home Management : 8-22 mins $Therapeutic Activity: 8-22 mins  Lorre Munroe, OTR/L  Lorre Munroe 08/13/2020, 12:31 PM

## 2020-08-13 NOTE — Progress Notes (Signed)
Intraoperative EKG.

## 2020-08-13 NOTE — Evaluation (Signed)
Physical Therapy Evaluation Patient Details Name: Dean Rasmussen MRN: [id number] DOB: [birth year] Today's Date: 08/13/2020   History of Present Illness  69 year old male with past medical history for uncontrolled type 2 diabetes mellitus and hypertension.  Patient reported about 3-week history of erythema and edema at the right midfoot, rapidly progressed and worsening.  Outpatient evaluation with an MRI showed large abscess with possible osteomyelitis. s/p surgical debridement 08/12/20.  Clinical Impression   Patient received in chair in extreme sacral sitting position- reports he has not been able to find a comfortable position in this chair for some time now. Able to mobilize well this afternoon but required more consistent MinA due to increased fatigue this afternoon; performed majority of STP transfer back to bed with MinA, however once in front of bed preparing to sit, lost balance posteriorly and required ModA to safely lower down to sitting position on EOB. Declined further knee scooter practice due to fatigue. Left in bed with all needs met, bed alarm active. Feel that he is OK to DC home with HHPT/assist for OOB and mobility, but would recommend RW primarily for transfers and knee scooter to cover distances.     Follow Up Recommendations Home health PT;Supervision for mobility/OOB    Equipment Recommendations  Rolling walker with 5" wheels;Other (comment) (knee scooter)    Recommendations for Other Services       Precautions / Restrictions Precautions Precautions: Fall Precaution Comments: low fall Restrictions Weight Bearing Restrictions: Yes RLE Weight Bearing: Non weight bearing      Mobility  Bed Mobility Overal bed mobility: Needs Assistance Bed Mobility: Sit to Supine     Supine to sit: Supervision;HOB elevated Sit to supine: Supervision   General bed mobility comments: S with increased time/effort, good awareness of NWB status R foot for return to  bed  Transfers Overall transfer level: Needs assistance Equipment used: Rolling walker (2 wheeled) Transfers: Sit to/from [facility] Sit to Stand: Min assist Stand pivot transfers: Min assist;Mod assist       General transfer comment: needed MinA more consistently to maintain balance with RW due to posterior balance loss when turning, then at end of turn lost balance posteriorly and required ModA to safely lower down to sitting at EOB- more fatigued this afternoon and more unsteady in general  Ambulation/Gait             General Gait Details: deferred- fatigue  Stairs            Wheelchair Mobility    Modified Rankin (Stroke Patients Only)       Balance Overall balance assessment: Needs assistance Sitting-balance support: No upper extremity supported;Feet supported Sitting balance-Leahy Scale: Good     Standing balance support: During functional activity;Single extremity supported Standing balance-Leahy Scale: Poor Standing balance comment: reliant on at least one UE support during dynamic tasks                             Pertinent Vitals/Pain Pain Assessment: Faces Faces Pain Scale: Hurts little more Pain Location: back more than R foot this PM Pain Descriptors / Indicators: Pressure;Grimacing;Guarding Pain Intervention(s): Limited activity within patient's tolerance;Monitored during session;Repositioned    Home Living Family/patient expects to be discharged to:: Private residence Living Arrangements: Spouse/significant other;Children Available Help at Discharge: Family;Available 24 hours/day (wife is a Marine scientist and works from home) Type of Home: Apartment Home Access: Level entry;Other (comment) (slight incline sidewalk up to apartment)  Home Layout: One level   Additional Comments: Just moved to [place] this week    Prior Function Level of Independence: Independent         Comments: Independent with all ADLs, iADLs  and mobility without AD. Pt drives, semi retired with Recruitment consultant work.      Hand Dominance   Dominant Hand: Right    Extremity/Trunk Assessment   Upper Extremity Assessment Upper Extremity Assessment: Defer to OT evaluation LUE Deficits / Details: hx of rotator cuff injury, < 5* active shoulder flexion or abduction, PROM to 90* shoulder flexion LUE Sensation: WNL    Lower Extremity Assessment Lower Extremity Assessment: Generalized weakness    Cervical / Trunk Assessment Cervical / Trunk Assessment: Normal  Communication   Communication: No difficulties  Cognition Arousal/Alertness: Awake/alert Behavior During Therapy: WFL for tasks assessed/performed Overall Cognitive Status: Within Functional Limits for tasks assessed                                        General Comments General comments (skin integrity, edema, etc.): Provided education on helpful DME in the home, safety with ADLs including use of tub bench and protecting surgical foot during bathing tasks    Exercises     Assessment/Plan    PT Assessment Patient needs continued PT services  PT Problem List Decreased strength;Decreased knowledge of use of DME;Decreased activity tolerance;Decreased safety awareness;Decreased balance;Decreased skin integrity;Decreased knowledge of precautions;Pain;Decreased mobility;Decreased coordination       PT Treatment Interventions DME instruction;Balance training;Gait training;Functional mobility training;Patient/family education;Therapeutic activities;Therapeutic exercise    PT Goals (Current goals can be found in the Care Plan section)  Acute Rehab PT Goals Patient Stated Goal: go home safely and with needed DME PT Goal Formulation: With patient Time For Goal Achievement: 08/27/20 Potential to Achieve Goals: Good    Frequency Min 3X/week   Barriers to discharge        Co-evaluation               AM-PAC PT "6 Clicks" Mobility  Outcome  Measure Help needed turning from your back to your side while in a flat bed without using bedrails?: A Little Help needed moving from lying on your back to sitting on the side of a flat bed without using bedrails?: A Little Help needed moving to and from a bed to a chair (including a wheelchair)?: A Little Help needed standing up from a chair using your arms (e.g., wheelchair or bedside chair)?: A Little Help needed to walk in hospital room?: A Little Help needed climbing 3-5 steps with a railing? : A Lot 6 Click Score: 17    End of Session Equipment Utilized During Treatment: Gait belt Activity Tolerance: Patient tolerated treatment well;Patient limited by fatigue Patient left: in bed;with call bell/phone within reach;with bed alarm set Nurse Communication: Mobility status PT Visit Diagnosis: Unsteadiness on feet (R26.81);Difficulty in walking, not elsewhere classified (R26.2);Pain;Muscle weakness (generalized) (M62.81) Pain - Right/Left: Right Pain - part of body: Ankle and joints of foot    Time: 1433-1450 PT Time Calculation (min) (ACUTE ONLY): 17 min   Charges:   PT Evaluation $PT Eval Low Complexity: 1 Low          Windell Norfolk, DPT, PN1   Supplemental Physical Therapist [place]    Pager [phone] Acute Rehab Office [phone]

## 2020-08-13 NOTE — Progress Notes (Addendum)
Inpatient Diabetes Program Recommendations  AACE/ADA: New Consensus Statement on Inpatient Glycemic Control (2015)  Target Ranges:  Prepandial:   less than 140 mg/dL      Peak postprandial:   less than 180 mg/dL (1-2 hours)      Critically ill patients:  140 - 180 mg/dL   Results for EARMON, SHERROW (MRN [id number]) as of 08/13/2020 14:20  Ref. Range 08/12/2020 06:35 08/12/2020 12:30 08/12/2020 17:13 08/12/2020 20:58  Glucose-Capillary Latest Ref Range: 70 - 99 mg/dL 196 (H)  3 units NOVOLOG  131 (H) 279 (H)  8 units NOVOLOG  183 (H)    10 units LANTUS   Results for DRACEN, REIGLE (MRN [id number]) as of 08/13/2020 14:20  Ref. Range 08/13/2020 06:57 08/13/2020 11:35  Glucose-Capillary Latest Ref Range: 70 - 99 mg/dL 184 (H)  3 units NOVOLOG  265 (H)  8 units NOVOLOG    Results for MILIND, RAETHER (MRN [id number]) as of 08/13/2020 14:20  Ref. Range 08/09/2020 14:00  Hemoglobin A1C Latest Ref Range: 4.8 - 5.6 % 9.0 (H)  (211 mg/dl)    Admit with: Diabetic foot abscess with suspected osteomyelitis  History: DM  Home DM Meds: Amaryl 4 mg Daily       Lyumjev Insulin (Iinsulin Lispro--aabc) 10 units TID with meals for CBG >200       Metformin 2000 mg QPM       Januvia 100 mg QPM  Current Orders: Novolog Moderate Correction Scale/ SSI (0-15 units) TID AC       Novolog 5 units TID with meals      Lantus 10 units QHS      Metformin 1000 mg BID      Tradjenta 5 mg Daily   MD-  Note Novolog Meal Coverage to start tonight with Dinner.  Per records, pt takes a newer insulin at home called Lyumjev (10 units TID with meals when CBG >200).  Per review, this insulin is similar to Humalog but is in a U200 formulation.  Agree with addition of Meal Coverage today.  Patient agreeable to taking insulin short-term at home if needed to help better manage CBGs.     Addendum 3:20pm--Met w/ pt at bedside to discuss current A1c of 9%.  Pt told me he was disappointed to hear it as in the 9%  range but was not surprised.  Pt told me he was given a 16 day taper of Decadron to take at home by his PCP in [place] for back issues (pt just moved down to [place] from [place] about 1 week ago).  Pt told me he thinks the steroids and the foot infection are contributing to his higher CBGs.  Checks CBGs at home and has noticed his CBGs have been running in the 200 range.  Pt told me his PCP in [place] gave him two samples of the Lyumjev (insulin lispro-aabc) insulin pens and he has been using the insulin TID with meals at home when his pre-meal CBG is >200.  Pt states his wife is a Marine scientist and helps him with his injections.  Pt asked me if he will need insulin when he goes home this time and wanted to know if he could use the insulin he already has at home.  I told the pt that ultimately the MD will decide what meds to send him home on and that he may need some insulin at least in the short-term to help heal his foot more quickly.  Pt agreeable and states he  is OK using insulin at home since his wife helps him.  Reviewed CBG goals at home with pt as well.  Also strongly encouraged pt to seek care under a new PCP in [place] and a new Endocrinologist as well.  Pt appreciative of visit and stated he has no further questions at this time.    --Will follow patient during hospitalization--  Wyn Quaker RN, MSN, CDE Diabetes Coordinator Inpatient Glycemic Control Team Team Pager: [phone] (8a-5p)

## 2020-08-13 NOTE — Progress Notes (Addendum)
PROGRESS NOTE    Dean Rasmussen  QBH:[id number] DOB: November 21, 1951 DOA: 08/08/2020 PCP: Patient, [name]    Brief Narrative:  Patient admitted with right foot abscess, possible osteomyelitis.  69 year old male with past medical history for uncontrolled type 2 diabetes mellitus and hypertension.  Patient reported about 3-week history of erythema and edema at the right midfoot, rapidly progressed and worsening.  Outpatient evaluation with an MRI showed large abscess with possible osteomyelitis.  On his initial physical examination blood pressure 137/81, heart rate 94, respiratory 22, temperature 98.4, oxygen saturation 93%, his lungs are clear to auscultation bilaterally, heart S1-S2, present rhythmic, his abdomen was soft, no lower extremity edema, right midfoot had a 10 cm soft tissue edema, with erythema and fluctuance. Sodium 136, potassium 4.0, chloride 101, bicarb 26, glucose 301, BUN 32, creatinine 1.12, white count 21.4, hemoglobin 12.0, hematocrit 36.8, platelets 240.  SARS COVID-19 negative.  Right foot MRI with focal soft tissue ulceration at the plantar aspect of the right foot underlying the level of the first tarsometatarsal joint with sinus tract extending onto the plantar aspect of the first TMT joint.  Subtle signal changes within the plantar aspect of the first metatarsal base and medial cuneiform compatible with acute osteomyelitis.  Possible septic arthritis of the first TMT joint.  Large fluid entered continue collection along the medial and dorsal aspect of midfoot measuring up to 6.7 cm.  Patient was placed on broad spectrum antibiotic therapy, underwent I&D with placement of antibiotic beads along with bone biopsy on 9/10.  09/13 underwent right foot secondary wound closure and removal of antibiotic beads, Prep wound bed 6.5 cm x 4.5 cm application of skin substitute.   Assessment & Plan:   Principal Problem:   Osteomyelitis (HCC) Active Problems:   Essential  hypertension   Hypertensive left ventricular hypertrophy, without heart failure   Type 2 diabetes mellitus without complication, without long-term current use of insulin (HCC)   Ventricular ectopy   Abscess of right foot   Open wound of right foot with complication   1. Diabetic foot abscess with suspected osteomyelitis. Sp I&D/ biopsy with antibiotic beads and secondary wound closure. Wbc continue trending down to 9.3, . Foot cultures positive for staphylococcus (MSSA) and streptococcus. Bone biopsy with no osteomyelitis.   Patient has been on Vancomycin, will change to cefazolin. Follow with PT and OT, plan for possible dc home in am to continue with oral antibiotic therapy.  Pain control with acetaminophen and hydrocodone.   2, Uncontrolled T2DM Hgb A1c 9,0.  Fasting glucose is 211, capillary gluose 279, 183, 184, 265.   Continue with insulin glargine last night 10 units. At home patient on metformin, glimepiride, and sitagliptin.  Will resume metformin for now and will add pre-meal insulin 5 units aspart. Continue to follow up on glucose.  Consult diabetic coordination, including diabetic teaching.   3. HTN. on metoprolol for blood pressure control with good toleration.   4. Hypothyroid. On levothyroxine.   5. Constipation. Continue with bid miralax.   6. New intraoperative atrial fibrillation. Will check EKG and echocardiogram, add cardiac monitor. Considering DM, HTN, and age his CHADS vasc score is 2 and will benefit from antithrombotic therapy.   Not able to see EKG in the EMR.   Status is: Inpatient  Remains inpatient appropriate because:IV treatments appropriate due to intensity of illness or inability to take PO   Dispo: The patient is from: Home  Anticipated d/c is to: Home              Anticipated d/c date is: 1 day              Patient currently is not medically stable to d/c. Possible discharge tomorrow if continue to improve.    DVT  prophylaxis: Enoxaparin   Code Status:   full  Family Communication:   I spoke over the phone with the patient's wife about patient's  condition, plan of care, prognosis and all questions were addressed.    Consultants:   Podiatry   Procedures:   Right foot I&D   Antimicrobials:   Vancomycin    Subjective: Patient continue to have elevated glucose, his foot pain has improved, but not yet back to baseline, continue to have difficulty ambulating, no nausea or vomiting.   Objective: Vitals:   08/12/20 1921 08/12/20 2350 08/13/20 0336 08/13/20 0754  BP: (!) 152/77 106/75 139/84 119/81  Pulse: 91 86 74 78  Resp: 16 16 15 17   Temp: (!) 100.9 F (38.3 C) 99 F (37.2 C) 97.7 F (36.5 C) 98.5 F (36.9 C)  TempSrc: Oral Oral Oral Oral  SpO2: 100% 99% 100% 100%  Weight:      Height:        Intake/Output Summary (Last 24 hours) at 08/13/2020 1104 Last data filed at 08/13/2020 0900 Gross per 24 hour  Intake 1000 ml  Output 2526 ml  Net -1526 ml   Filed Weights   08/09/20 1128  Weight: 81.6 kg    Examination:   General: Not in pain or dyspnea, deconditioned  Neurology: Awake and alert, non focal  E ENT: no pallor, no icterus, oral mucosa moist Cardiovascular: No JVD. S1-S2 present, rhythmic, no gallops, rubs, or murmurs. No lower extremity edema. Pulmonary: positive breath sounds bilaterally, adequate air movement, no wheezing, rhonchi or rales. Gastrointestinal. Abdomen soft and non tender Skin. Right foot with dressing in place.  Musculoskeletal: no joint deformities     Data Reviewed: I have personally reviewed following labs and imaging studies  CBC: Recent Labs  Lab 08/08/20 2055 08/08/20 2055 08/09/20 0919 08/09/20 1350 08/10/20 0628 08/11/20 0431 08/13/20 0517  WBC 21.4*   < > 23.1* 19.5* 15.1* 11.7* 9.3  NEUTROABS 19.9*  --   --   --   --   --  7.6  HGB 12.0*   < > 13.7 12.1* 11.3* 10.8* 10.2*  HCT 36.8*   < > 42.2 36.9* 33.7* 32.2* 31.3*    MCV 94.4   < > 95.9 94.4 93.1 94.7 93.4  PLT 240   < > 241 211 198 187 181   < > = values in this interval not displayed.   Basic Metabolic Panel: Recent Labs  Lab 08/08/20 2055 08/09/20 1350 08/10/20 0628 08/11/20 0431 08/13/20 0517  NA 136  --  134* 136 135  K 4.0  --  3.8 3.8 3.8  CL 101  --  100 101 98  CO2 26  --  25 29 30   GLUCOSE 301*  --  214* 236* 211*  BUN 32*  --  18 17 13   CREATININE 1.12 1.13 1.05 1.17 1.13  CALCIUM 8.9  --  8.3* 8.3* 8.3*   GFR: Estimated Creatinine Clearance: 63.7 mL/min (by C-G formula based on SCr of 1.13 mg/dL). Liver Function Tests: Recent Labs  Lab 08/08/20 2055 08/10/20 0628  AST 17 12*  ALT 24 16  ALKPHOS 58 53  BILITOT 0.9  1.0  PROT 6.5 5.2*  ALBUMIN 3.3* 2.4*   No results for input(s): LIPASE, AMYLASE in the last 168 hours. No results for input(s): AMMONIA in the last 168 hours. Coagulation Profile: Recent Labs  Lab 08/08/20 2055 08/09/20 0919  INR 1.0 1.0   Cardiac Enzymes: No results for input(s): CKTOTAL, CKMB, CKMBINDEX, TROPONINI in the last 168 hours. BNP (last 3 results) No results for input(s): PROBNP in the last 8760 hours. HbA1C: No results for input(s): HGBA1C in the last 72 hours. CBG: Recent Labs  Lab 08/12/20 0635 08/12/20 1230 08/12/20 1713 08/12/20 2058 08/13/20 0657  GLUCAP 196* 131* 279* 183* 184*   Lipid Profile: No results for input(s): CHOL, HDL, LDLCALC, TRIG, CHOLHDL, LDLDIRECT in the last 72 hours. Thyroid Function Tests: No results for input(s): TSH, T4TOTAL, FREET4, T3FREE, THYROIDAB in the last 72 hours. Anemia Panel: No results for input(s): VITAMINB12, FOLATE, FERRITIN, TIBC, IRON, RETICCTPCT in the last 72 hours.    Radiology Studies: I have reviewed all of the imaging during this hospital visit personally     Scheduled Meds: . Chlorhexidine Gluconate Cloth  6 each Topical Q0600  . enoxaparin (LOVENOX) injection  40 mg Subcutaneous Q24H  . insulin aspart  0-15 Units  Subcutaneous TID WC  . insulin glargine  10 Units Subcutaneous QHS  . levothyroxine  25 mcg Oral Q0600  . linagliptin  5 mg Oral Daily  . metoprolol succinate  100 mg Oral Daily  . mupirocin ointment  1 application Nasal BID  . polyethylene glycol  17 g Oral BID  . sodium chloride flush  3 mL Intravenous Q12H   Continuous Infusions: . sodium chloride 250 mL (08/09/20 2044)  . lactated ringers 10 mL/hr at 08/12/20 1017  . vancomycin 1,000 mg (08/13/20 0143)     LOS: 4 days        Natashia Roseman Annett Gula, MD

## 2020-08-13 NOTE — Progress Notes (Addendum)
ANTICOAGULATION CONSULT NOTE - Initial Consult  Pharmacy Consult for Apixaban Indication: atrial fibrillation  No Known Allergies  Patient Measurements: Height: 5\' 10"  (177.8 cm) Weight: 81.6 kg (180 lb) IBW/kg (Calculated) : 73  Vital Signs: Temp: 98.9 F (37.2 C) (09/14 1551) Temp Source: Oral (09/14 1551) BP: 131/74 (09/14 1551) Pulse Rate: 78 (09/14 1551)  Labs: Recent Labs    08/11/20 0431 08/11/20 0431 08/13/20 0517 08/13/20 1254  HGB 10.8*   < > 10.2* 11.1*  HCT 32.2*  --  31.3* 34.0*  PLT 187  --  181 207  CREATININE 1.17  --  1.13  --    < > = values in this interval not displayed.    Estimated Creatinine Clearance: 63.7 mL/min (by C-G formula based on SCr of 1.13 mg/dL).   Medical History: Past Medical History:  Diagnosis Date  . Diabetes mellitus without complication (HCC)   . Hypertension     Assessment: 69 yr old man was admitted on 08/08/20 with diabetic foot infection, osteomyelitis; now with new atrial fibrillation. Pharmacy was consulted to start apixaban for a fib. Pt has been receiving enoxaparin 40 mg SQ daily for VTE prophylaxis; last dose  today at 0930 AM.  H/H 11.1/34.0, platelets 207 (CBC stable); 69 yrs old, 81.6 kg, Scr 1.13.  Goal of Therapy:  Prevention of stroke secondary to atrial fibrillation Monitor platelets by anticoagulation protocol: Yes   Plan:  D/C Lovenox Apixaban 5 mg PO BID, starting this evening  Monitor daily CBC Monitor for signs/symptoms of bleeding  11-17-1996, PharmD, BCPS, [facility] Clinical Pharmacist 08/13/2020,5:13 PM

## 2020-08-13 NOTE — Anesthesia Postprocedure Evaluation (Signed)
Anesthesia Post Note  Patient: Dean Rasmussen  Procedure(s) Performed: Secondary wound closure and removal of antibiotic beads (Right )     Patient location during evaluation: PACU Anesthesia Type: MAC Level of consciousness: awake and alert Pain management: pain level controlled Vital Signs Assessment: post-procedure vital signs reviewed and stable Respiratory status: spontaneous breathing, nonlabored ventilation, respiratory function stable and patient connected to nasal cannula oxygen Cardiovascular status: stable Postop Assessment: no apparent nausea or vomiting Anesthetic complications: no Comments: Patient with afib diagnosed intraop and confirmed with EKG in PACU. No previous history of afib noted in chart. Rate controlled.   No complications documented.  Last Vitals:  Vitals:   08/13/20 0336 08/13/20 0754  BP: 139/84 119/81  Pulse: 74 78  Resp: 15 17  Temp: 36.5 C 36.9 C  SpO2: 100% 100%    Last Pain:  Vitals:   08/13/20 0754  TempSrc: Oral  PainSc:                  Tammela Bales

## 2020-08-13 NOTE — Plan of Care (Signed)
  Problem: Pain Managment: Goal: General experience of comfort will improve Outcome: Progressing   Problem: Safety: Goal: Ability to remain free from injury will improve Outcome: Progressing   Problem: Skin Integrity: Goal: Risk for impaired skin integrity will decrease Outcome: Progressing   

## 2020-08-14 ENCOUNTER — Inpatient Hospital Stay (HOSPITAL_COMMUNITY): Payer: No Typology Code available for payment source

## 2020-08-14 ENCOUNTER — Inpatient Hospital Stay: Payer: Self-pay

## 2020-08-14 ENCOUNTER — Telehealth: Payer: Self-pay | Admitting: Urology

## 2020-08-14 DIAGNOSIS — D649 Anemia, unspecified: Secondary | ICD-10-CM

## 2020-08-14 DIAGNOSIS — I4891 Unspecified atrial fibrillation: Secondary | ICD-10-CM

## 2020-08-14 DIAGNOSIS — E1140 Type 2 diabetes mellitus with diabetic neuropathy, unspecified: Secondary | ICD-10-CM

## 2020-08-14 DIAGNOSIS — M1460 Charcot's joint, unspecified site: Secondary | ICD-10-CM | POA: Diagnosis present

## 2020-08-14 HISTORY — DX: Type 2 diabetes mellitus with diabetic neuropathy, unspecified: E11.40

## 2020-08-14 HISTORY — DX: Charcot's joint, unspecified site: M14.60

## 2020-08-14 HISTORY — DX: Anemia, unspecified: D64.9

## 2020-08-14 HISTORY — DX: Unspecified atrial fibrillation: I48.91

## 2020-08-14 LAB — GLUCOSE, CAPILLARY
Glucose-Capillary: 176 mg/dL — ABNORMAL HIGH (ref 70–99)
Glucose-Capillary: 207 mg/dL — ABNORMAL HIGH (ref 70–99)
Glucose-Capillary: 342 mg/dL — ABNORMAL HIGH (ref 70–99)
Glucose-Capillary: 188 mg/dL — ABNORMAL HIGH (ref 70–99)

## 2020-08-14 LAB — BASIC METABOLIC PANEL
Anion gap: 5 (ref 5–15)
BUN: 19 mg/dL (ref 8–23)
CO2: 29 mmol/L (ref 22–32)
Calcium: 8.3 mg/dL — ABNORMAL LOW (ref 8.9–10.3)
Chloride: 101 mmol/L (ref 98–111)
Creatinine, Ser: 1.21 mg/dL (ref 0.61–1.24)
GFR calc Af Amer: >60 mL/min (ref >60)
GFR calc non Af Amer: >60 mL/min (ref >60)
Glucose, Bld: 216 mg/dL — ABNORMAL HIGH (ref 70–99)
Potassium: 3.6 mmol/L (ref 3.5–5.1)
Sodium: 135 mmol/L (ref 135–145)

## 2020-08-14 LAB — ECHOCARDIOGRAM COMPLETE
Area-P 1/2: 3.72 cm2
Height: 70 in
S' Lateral: 3.5 cm
Weight: 2880 oz

## 2020-08-14 LAB — AEROBIC/ANAEROBIC CULTURE W GRAM STAIN (SURGICAL/DEEP WOUND)

## 2020-08-14 MED ORDER — SODIUM CHLORIDE 0.9 % IV SOLN
1.0000 g | INTRAVENOUS | Status: DC
  Administered 2020-08-14 – 2020-08-16 (×3): 1000 mg via INTRAVENOUS
  Filled 2020-08-14 (×4): qty 1

## 2020-08-14 NOTE — Progress Notes (Signed)
Subjective: POD #2 s/p wound debridement, removal of antibiotic beads, delayed primary closure with application of skin substitute. States that he is feeling better today and the pain is better. He did more with PT. Awaiting PICC. Currently denies any fevers, chills or other systemic concerns.   Objective: AAO x3, NAD DP/PT pulses palpable bilaterally, CRT less than 3 seconds RLE: Bandage clean, dry, intact without any strike through.   LLE without any ulceration  No pain with calf compression, swelling, warmth, erythema  Assessment: POD #2 s/p return to the OR for wound debridement, closure, graft application  Plan: At this point continue with IV antibiotics and awaiting PICC line continue nonweightbearing, elevation.  We will plan to change the bandage on Friday or sooner if needed.  I also discussed the case with the patient's wife earlier today.  Ovid Curd, DPM

## 2020-08-14 NOTE — Progress Notes (Signed)
Physical Therapy Treatment Patient Details Name: Dean Rasmussen MRN: [id number] DOB: 1951/02/24 Today's Date: 08/14/2020    History of Present Illness 69 year old male with past medical history for uncontrolled type 2 diabetes mellitus and hypertension.  Patient reported about 3-week history of erythema and edema at the right midfoot, rapidly progressed and worsening.  Outpatient evaluation with an MRI showed large abscess with possible osteomyelitis. s/p surgical debridement 08/12/20.    PT Comments    Patient received in bed, pleasant and cooperative with therapies but reporting moderate L shoulder discomfort/pain today. Practiced transfers to/from knee scooter with multiple set ups with most successful being with knee scooter parked parallel to bed with PT blocking scooter from moving while guarding patient as he pivoted and placed his R LE on the scooter. Needed min-mod cues to maintain NWB status today with bed mobility and transfers. Tolerated riding knee scooter approximately 69ft in hallway, limited by fatigue. Left in bed with all needs met, bed alarm active.     Follow Up Recommendations  Home health PT;Supervision for mobility/OOB     Equipment Recommendations  Rolling walker with 5" wheels;Other (comment) (knee scooter)    Recommendations for Other Services       Precautions / Restrictions Precautions Precautions: Fall Precaution Comments: low fall Restrictions Weight Bearing Restrictions: Yes RLE Weight Bearing: Non weight bearing    Mobility  Bed Mobility Overal bed mobility: Needs Assistance Bed Mobility: Supine to Sit;Sit to Supine     Supine to sit: Supervision;HOB elevated Sit to supine: Supervision;HOB elevated   General bed mobility comments: S in general with cues for line management; did need cues to maintain NWB R LE when repositioning in bed at EOS  Transfers Overall transfer level: Needs assistance Equipment used:  (to knee scooter) Transfers: Stand  Pivot Transfers   Stand pivot transfers: Min assist       General transfer comment: MinA for multiple pivots to/from knee scooter without RW, also min-mod cues to maintain true NWB R LE while doing this. Most successful with knee scooter parallel to bed and with PT helping to block scooter from moving  Ambulation/Gait             General Gait Details: able to propel knee scooter approximately 64ft with Min guard for balance/safety; did not push gait with RW today due to L shoulder pain with mobility   Stairs             Wheelchair Mobility    Modified Rankin (Stroke Patients Only)       Balance Overall balance assessment: Needs assistance Sitting-balance support: No upper extremity supported;Feet supported Sitting balance-Leahy Scale: Good     Standing balance support: During functional activity;Single extremity supported Standing balance-Leahy Scale: Poor Standing balance comment: reliant on at least one UE support during dynamic tasks                            Cognition Arousal/Alertness: Awake/alert Behavior During Therapy: WFL for tasks assessed/performed Overall Cognitive Status: Within Functional Limits for tasks assessed                                        Exercises      General Comments        Pertinent Vitals/Pain Pain Assessment: Faces Faces Pain Scale: Hurts a little bit Pain Location: L shoulder Pain  Descriptors / Indicators: Aching;Sore Pain Intervention(s): Limited activity within patient's tolerance;Monitored during session    Home Living                      Prior Function            PT Goals (current goals can now be found in the care plan section) Acute Rehab PT Goals Patient Stated Goal: go home safely and with needed DME PT Goal Formulation: With patient Time For Goal Achievement: 08/27/20 Potential to Achieve Goals: Good Additional Goals Additional Goal #1: Patient will  demonstrate ability to navigate at least 8ft in knee scooter with only general S Progress towards PT goals: Progressing toward goals    Frequency    Min 3X/week      PT Plan Current plan remains appropriate    Co-evaluation              AM-PAC PT "6 Clicks" Mobility   Outcome Measure  Help needed turning from your back to your side while in a flat bed without using bedrails?: None Help needed moving from lying on your back to sitting on the side of a flat bed without using bedrails?: None Help needed moving to and from a bed to a chair (including a wheelchair)?: A Little Help needed standing up from a chair using your arms (e.g., wheelchair or bedside chair)?: A Little Help needed to walk in hospital room?: A Little Help needed climbing 3-5 steps with a railing? : A Lot 6 Click Score: 19    End of Session Equipment Utilized During Treatment: Gait belt Activity Tolerance: Patient tolerated treatment well Patient left: in bed;with call bell/phone within reach;with bed alarm set Nurse Communication: Mobility status PT Visit Diagnosis: Unsteadiness on feet (R26.81);Difficulty in walking, not elsewhere classified (R26.2);Pain;Muscle weakness (generalized) (M62.81) Pain - Right/Left: Right Pain - part of body: Ankle and joints of foot     Time: 1510-1535 PT Time Calculation (min) (ACUTE ONLY): 25 min  Charges:  $Therapeutic Activity: 23-37 mins                     Windell Norfolk, DPT, PN1   Supplemental Physical Therapist [place]    Pager [phone] Acute Rehab Office [phone]

## 2020-08-14 NOTE — Telephone Encounter (Signed)
I called and spoke with the patient's wife. She just wanted to make sure that I was aware that he is going to get a PICC line.

## 2020-08-14 NOTE — TOC Initial Note (Addendum)
Transition of Care [facility]) - Initial/Assessment Note    Patient Details  Name: Dean Rasmussen MRN: [id number] Date of Birth: 12-Jul-1951  [facility]) CM/SW Contact:    Epifanio Lesches, RN Phone Number: [phone] 08/14/2020, 3:04 PM  Clinical Narrative:                 Presents with diabetic foot infection with abscess, cellulitis, acute osteomyelitis and septic arthritis. From home with nurse wife. PTA independent with ADL's, no DME usage.     R diabetic foot infection, s/p Secondary wound closure and removal of antibiotic beads, Application skin                   substitute, 9/13  PT/OT evals noted : Home health PT/OT;Supervision for mobility/OOB. Pt agreeable to [facility] services. Pt with preference. NCM has called several agencies [facility], [place], [facility], [place], [facility]) and is unable to land provider. NCM has reached out to pt's insurance CM , Heather @ [phone] for help. Voice message left.  NCM noted DME need for rolling walker and tub bench, referral made with [employer] and will be delivered to bedsides prior to d.c.  TOC team will continue to monitor for needs...  Expected Discharge Plan: Home w Home Health Services Barriers to Discharge: Continued Medical Work up   Patient Goals and CMS Choice        Expected Discharge Plan and Services Expected Discharge Plan: Home w Home Health Services                                              Prior Living Arrangements/Services                       Activities of Daily Living Home Assistive Devices/Equipment: None ADL Screening (condition at time of admission) Patient's cognitive ability adequate to safely complete daily activities?: Yes Is the patient deaf or have difficulty hearing?: No Does the patient have difficulty seeing, even when wearing glasses/contacts?: No Does the patient have difficulty concentrating, remembering, or making decisions?: No Patient able to express need for  assistance with ADLs?: Yes Does the patient have difficulty dressing or bathing?: No Independently performs ADLs?: Yes (appropriate for developmental age) Does the patient have difficulty walking or climbing stairs?: No Weakness of Legs: None Weakness of Arms/Hands: None  Permission Sought/Granted                  Emotional Assessment              Admission diagnosis:  Osteomyelitis (HCC) [M86.9] Abscess of right foot [L02.611] Osteomyelitis of right foot, unspecified type [facility]) [M86.9] Patient Active Problem List   Diagnosis Date Noted  . Diabetic neuropathy (HCC) 08/14/2020  . Charcot's arthropathy 08/14/2020  . Normocytic anemia 08/14/2020  . Open wound of right foot with complication   . Abscess of right foot   . Essential hypertension 08/24/2019  . Hypertensive left ventricular hypertrophy, without heart failure 08/24/2019  . Type 2 diabetes mellitus without complication, without long-term current use of insulin (HCC) 08/24/2019  . Ventricular ectopy 08/24/2019   PCP:  Patient, [name] Pharmacy:   [facility] [phone] - [place], [place] - [address] [place] [address] Phone: [phone] Fax: [phone]     Social Determinants of Health (SDOH) Interventions  Readmission Risk Interventions No flowsheet data found.

## 2020-08-14 NOTE — Progress Notes (Signed)
Reviewed risk and benefits of PICC insertion.  Patient agreeable and signed consent.  States wish to wait until tomorrow am for insertion.  Plan to place PICC in am.

## 2020-08-14 NOTE — Progress Notes (Signed)
PROGRESS NOTE    Dean Rasmussen  YQM:[id number] DOB: [birth year] DOA: 08/08/2020 PCP: Patient, [name]    Brief Narrative:  Dean Rasmussen is a 69 year old male with past medical history notable for uncontrolled type 2 diabetes mellitus, hypertension who presented to the ED with progressive 3-week history of erythema, edema and pain to the right midfoot.  Recent outpatient evaluation with MRI showed large abscess with possible osteomyelitis.  In the ED, BP 137/81, HR 94, RR 22, temperature 98.4, SPO2 93% on room air.  Sodium 136, potassium 4.0, BUN 32, creatinine 1.12, WBC count 21.4, hemoglobin 12.0, platelets 240.  SARS-CoV-2 negative.  Podiatry was consulted, TRH consulted for admission.   Assessment & Plan:   Principal Problem:   Abscess of right foot Active Problems:   Essential hypertension   Hypertensive left ventricular hypertrophy, without heart failure   Type 2 diabetes mellitus without complication, without long-term current use of insulin (HCC)   Ventricular ectopy   Open wound of right foot with complication   Diabetic neuropathy (HCC)   Charcot's arthropathy   Normocytic anemia   Diabetic foot abscess with suspected osteomyelitis Patient presented with 3-week history of progressive right foot swelling, erythema and pain.  MR right foot with soft tissue ulceration plantar aspect right foot with sinus tract extending plantar aspect first TMT joint, suspect septic arthritis of first TMT joint, signal changes plantar aspect first metatarsal base and medial cuneiform compatible with acute osteomyelitis, large fluid and air-containing collection medial/dorsal aspect of midfoot measuring up to 6.7 cm consistent with abscess, subchondral marrow changes, and fatty atrophy with diffuse edema-like signal suggesting denervation changes and or myositis.  Podiatry was consulted and underwent irrigation and debridement with antibiotic bead placement by Dr. Lilian Kapur on 08/12/2020 followed by  secondary wound closure with antibiotic bead removal on 08/12/2020. --Podiatry following, appreciate assistance --Wound culture 9/10: MSSA --Wound culture 9/10: MSSA, Streptococcus anginosis, bacteroides fragilis --Bone biopsy: No active osteomyelitis but with MSSA  --IV vancomycin transitioned to cefazolin --Continue nonweightbearing right lower extremity per podiatry --Continue PT/OT efforts --ID consulted for assistance with antibiotic choice/duration for discharge planning  Type 2 diabetes mellitus, uncontrolled Hemoglobin A1c 9.0.  On glimepiride 4 mg p.o. daily, Metformin 2000 mg p.o. daily, and Januvia 100 mg p.o. daily at home. --Hold oral Metformin and Amaryl while inpatient --Continue Tradjenta as substitution for home Januvia while inpatient --Lantus 10 units [place] qHS --NovoLog 5 TIDAC --Insulin sliding scale for further coverage --CBGs qAC/HS  Paroxysmal atrial fibrillation, intraoperative, new onset In the operating room, patient was noted to have episode of atrial fibrillation which has spontaneously resolved.  CHA2DS2-VASc score = 3 for history of hypertension, age, and diabetes history correlating to a 3.2% stroke risk per year. --Continue to monitor on telemetry, currently in NSR  Essential hypertension BP 124/79, well controlled. --Continue metoprolol succinate 100 mg p.o. daily  Hypothyroidism: Continue levothyroxine 25 mcg p.o. daily  Constipation --MiraLAX twice daily --Dulcolax suppository as needed   DVT prophylaxis: Lovenox Code Status: Full code Family Communication: Updated patient spouse [place] via telephone this afternoon  Disposition Plan:  Status is: Inpatient  Remains inpatient appropriate because:Unsafe d/c plan, IV treatments appropriate due to intensity of illness or inability to take PO and Inpatient level of care appropriate due to severity of illness   Dispo: The patient is from: Home              Anticipated d/c is to: Home  Anticipated d/c date is: 2 days              Patient currently is not medically stable to d/c.   Consultants:   Podiatry  Infectious disease, Dr. Orvan Falconer  Procedures:   Irrigation debridement right foot with implant antibiotic beads, Dr. Lilian Kapur; 08/09/2020  Secondary wound closure with removal of antibiotic beads, Dr. Lilian Kapur on 08/12/2020  Antimicrobials:   Vancomycin 9/10 -9/14  Cefazolin 9/14>>  Metronidazole 9/10 -9/10  Ceftriaxone 9/10 - 9/10  Zosyn 9/10 -9/13    Subjective: Patient seen and examined bedside, resting comfortably.  No complaints this morning.  Continues with dressing in place to right foot.  Continues on IV antibiotics.  Awaiting infectious disease consultation for antibiotic choice and duration for discharge planning.  Patient states has some questions but cannot really think of what he wanted to ask and defers to his spouse.  Denies headache, no fever/chills/night sweats, no nausea/vomiting/diarrhea, no chest pain, no palpitations, no shortness of breath, no abdominal pain, no weakness.  No acute events overnight per nursing staff.  Objective: Vitals:   08/13/20 1551 08/13/20 1958 08/14/20 0427 08/14/20 0810  BP: 131/74 121/72 124/79 138/81  Pulse: 78 82 92 70  Resp: Temp: 98.9 F (37.2 C) 98.7 F (37.1 C) (!) 97.5 F (36.4 C) 97.8 F (36.6 C)  TempSrc: Oral Oral Oral Oral  SpO2: 100% 97% 98% 100%  Weight:      Height:        Intake/Output Summary (Last 24 hours) at 08/14/2020 1449 Last data filed at 08/13/2020 2300 Gross per 24 hour  Intake 488 ml  Output 500 ml  Net -12 ml   Filed Weights   08/09/20 1128  Weight: 81.6 kg    Examination:  General exam: Appears calm and comfortable  Respiratory system: Clear to auscultation. Respiratory effort normal. Cardiovascular system: S1 & S2 heard, RRR. No JVD, murmurs, rubs, gallops or clicks. No pedal edema. Gastrointestinal system: Abdomen is nondistended, soft and  nontender. No organomegaly or masses felt. Normal bowel sounds heard. Central nervous system: Alert and oriented. No focal neurological deficits. Extremities: Symmetric 5 x 5 power. Skin: No rashes, lesions or ulcers Psychiatry: Judgement and insight appear normal. Mood & affect appropriate.     Data Reviewed: I have personally reviewed following labs and imaging studies  CBC: Recent Labs  Lab 08/08/20 2055 08/09/20 0919 08/09/20 1350 08/10/20 0628 08/11/20 0431 08/13/20 0517 08/13/20 1254  WBC 21.4*   < > 19.5* 15.1* 11.7* 9.3 9.3  NEUTROABS 19.9*  --   --   --   --  7.6 7.7  HGB 12.0*   < > 12.1* 11.3* 10.8* 10.2* 11.1*  HCT 36.8*   < > 36.9* 33.7* 32.2* 31.3* 34.0*  MCV 94.4   < > 94.4 93.1 94.7 93.4 93.2  PLT 240   < > 211 198 187 181 207   < > = values in this interval not displayed.   Basic Metabolic Panel: Recent Labs  Lab 08/08/20 2055 08/08/20 2055 08/09/20 1350 08/10/20 0628 08/11/20 0431 08/13/20 0517 08/14/20 0441  NA 136  --   --  134* 136 135 135  K 4.0  --   --  3.8 3.8 3.8 3.6  CL 101  --   --  100 101 98 101  CO2 26  --   --  GLUCOSE 301*  --   --  214* 236* 211* 216*  BUN 32*  --   --  18 17 13 19   CREATININE 1.12   < > 1.13 1.05 1.17 1.13 1.21  CALCIUM 8.9  --   --  8.3* 8.3* 8.3* 8.3*   < > = values in this interval not displayed.   GFR: Estimated Creatinine Clearance: 59.5 mL/min (by C-G formula based on SCr of 1.21 mg/dL). Liver Function Tests: Recent Labs  Lab 08/08/20 2055 08/10/20 0628  AST 17 12*  ALT 24 16  ALKPHOS 58 53  BILITOT 0.9 1.0  PROT 6.5 5.2*  ALBUMIN 3.3* 2.4*   No results for input(s): LIPASE, AMYLASE in the last 168 hours. No results for input(s): AMMONIA in the last 168 hours. Coagulation Profile: Recent Labs  Lab 08/08/20 2055 08/09/20 0919  INR 1.0 1.0   Cardiac Enzymes: No results for input(s): CKTOTAL, CKMB, CKMBINDEX, TROPONINI in the last 168 hours. BNP (last 3 results) No results for  input(s): PROBNP in the last 8760 hours. HbA1C: No results for input(s): HGBA1C in the last 72 hours. CBG: Recent Labs  Lab 08/13/20 1135 08/13/20 1632 08/13/20 2038 08/14/20 0632 08/14/20 1140  GLUCAP 265* 224* 175* 176* 207*   Lipid Profile: No results for input(s): CHOL, HDL, LDLCALC, TRIG, CHOLHDL, LDLDIRECT in the last 72 hours. Thyroid Function Tests: No results for input(s): TSH, T4TOTAL, FREET4, T3FREE, THYROIDAB in the last 72 hours. Anemia Panel: No results for input(s): VITAMINB12, FOLATE, FERRITIN, TIBC, IRON, RETICCTPCT in the last 72 hours. Sepsis Labs: Recent Labs  Lab 08/08/20 2055  LATICACIDVEN 1.2    Recent Results (from the past 240 hour(s))  WOUND CULTURE     Status: Abnormal   Collection Time: 08/07/20 10:31 AM   Specimen: Foot, Right; Wound  Result Value Ref Range Status   MICRO NUMBER: 10/07/20  Final   SPECIMEN QUALITY: Adequate  Final   SOURCE: WOUND (SITE NOT SPECIFIED)  Final   STATUS: FINAL  Final   GRAM STAIN:   Final    No white blood cells seen No epithelial cells seen Moderate Gram negative bacilli   RESULT:   Final    Additional organisms of questionable significance were isolated that normally do not warrant identification and susceptibilities. Please contact the laboratory within three days if identification and susceptibilities are clinically indicated.   ISOLATE 1: Staphylococcus aureus (A)  Final    Comment: Heavy growth of Staphylococcus aureus This isolate demonstrates inducible clindamycin resistance.      Susceptibility   Staphylococcus aureus - AEROBIC CULT, GRAM STAIN POSITIVE 1    VANCOMYCIN <=0.5 Sensitive     CIPROFLOXACIN <=0.5 Sensitive     CLINDAMYCIN <=0.25 Resistant     LEVOFLOXACIN 0.25 Sensitive     ERYTHROMYCIN >=8 Resistant     GENTAMICIN <=0.5 Sensitive     OXACILLIN 0.5 Sensitive     TETRACYCLINE >=16 Resistant     TRIMETH/SULFA* <=10 Sensitive      * Legend:S = Susceptible  I = IntermediateR = Resistant  NS  = Not susceptible* = Not tested  NR = Not reported**NN = See antimicrobic comments  Culture, blood (Routine x 2)     Status: None   Collection Time: 08/08/20  8:50 PM   Specimen: BLOOD RIGHT ARM  Result Value Ref Range Status   Specimen Description BLOOD RIGHT ARM  Final   Special Requests   Final    BOTTLES DRAWN AEROBIC AND ANAEROBIC Blood Culture adequate volume   Culture   Final    NO  GROWTH 5 DAYS Performed at [facility], [address] [place], [place] [address]    Report Status 08/13/2020 FINAL  Final  Culture, blood (Routine x 2)     Status: None   Collection Time: 08/08/20  8:56 PM   Specimen: BLOOD RIGHT HAND  Result Value Ref Range Status   Specimen Description BLOOD RIGHT HAND  Final   Special Requests   Final    BOTTLES DRAWN AEROBIC ONLY Blood Culture results may not be optimal due to an inadequate volume of blood received in culture bottles   Culture   Final    NO GROWTH 5 DAYS Performed at [facility], [address] [place], [place] [address]    Report Status 08/13/2020 FINAL  Final  SARS Coronavirus 2 by RT PCR (hospital order, performed in [facility] hospital lab) Nasopharyngeal Nasopharyngeal Swab     Status: None   Collection Time: 08/09/20 11:56 AM   Specimen: Nasopharyngeal Swab  Result Value Ref Range Status   SARS Coronavirus 2 NEGATIVE NEGATIVE Final    Comment: (NOTE) SARS-CoV-2 target nucleic acids are NOT DETECTED.  The SARS-CoV-2 RNA is generally detectable in upper and lower respiratory specimens during the acute phase of infection. The lowest concentration of SARS-CoV-2 viral copies this assay can detect is 250 copies / mL. A negative result does not preclude SARS-CoV-2 infection and should not be used as the sole basis for treatment or other patient management decisions.  A negative result may occur with improper specimen collection / handling, submission of specimen other than nasopharyngeal swab, presence of viral  mutation(s) within the areas targeted by this assay, and inadequate number of viral copies (<250 copies / mL). A negative result must be combined with clinical observations, patient history, and epidemiological information.  Fact Sheet for Patients:   BoilerBrush.com.cy  Fact Sheet for Healthcare Providers: https://pope.com/  This test is not yet approved or  cleared by the [place] FDA and has been authorized for detection and/or diagnosis of SARS-CoV-2 by FDA under an Emergency Use Authorization (EUA).  This EUA will remain in effect (meaning this test can be used) for the duration of the COVID-19 declaration under Section 564(b)(1) of the Act, 21 U.S.C. section 360bbb-3(b)(1), unless the authorization is terminated or revoked sooner.  Performed at [facility], [address], [place], [place] [address]   Aerobic/Anaerobic Culture (surgical/deep wound)     Status: None   Collection Time: 08/09/20  6:27 PM   Specimen: PATH Other; Tissue  Result Value Ref Range Status   Specimen Description ABSCESS RIGHT FOOT  Final   Special Requests SPEC A  Final   Gram Stain   Final    FEW WBC PRESENT, PREDOMINANTLY PMN ABUNDANT GRAM POSITIVE COCCI ABUNDANT GRAM NEGATIVE RODS    Culture   Final    ABUNDANT STREPTOCOCCUS ANGINOSIS MODERATE STAPHYLOCOCCUS AUREUS ABUNDANT BACTEROIDES FRAGILIS BETA LACTAMASE POSITIVE Performed at [facility], [address], [place], [place] [address]    Report Status 08/14/2020 FINAL  Final   Organism ID, Bacteria STREPTOCOCCUS ANGINOSIS  Final   Organism ID, Bacteria STAPHYLOCOCCUS AUREUS  Final      Susceptibility   Staphylococcus aureus - MIC*    CIPROFLOXACIN <=0.5 SENSITIVE Sensitive     ERYTHROMYCIN >=8 RESISTANT Resistant     GENTAMICIN <=0.5 SENSITIVE Sensitive     OXACILLIN <=0.25 SENSITIVE Sensitive     TETRACYCLINE >=16 RESISTANT Resistant     VANCOMYCIN <=0.5  SENSITIVE  Sensitive     TRIMETH/SULFA <=10 SENSITIVE Sensitive     CLINDAMYCIN RESISTANT Resistant     RIFAMPIN <=0.5 SENSITIVE Sensitive     Inducible Clindamycin POSITIVE Resistant     * MODERATE STAPHYLOCOCCUS AUREUS   Streptococcus anginosis - MIC*    PENICILLIN <=0.06 SENSITIVE Sensitive     CEFTRIAXONE 0.25 SENSITIVE Sensitive     ERYTHROMYCIN 2 RESISTANT Resistant     LEVOFLOXACIN 0.5 SENSITIVE Sensitive     VANCOMYCIN 0.5 SENSITIVE Sensitive     * ABUNDANT STREPTOCOCCUS ANGINOSIS  Aerobic/Anaerobic Culture (surgical/deep wound)     Status: None (Preliminary result)   Collection Time: 08/09/20  6:34 PM   Specimen: PATH Other; Tissue  Result Value Ref Range Status   Specimen Description TISSUE  Final   Special Requests FIRST METATARSAL BONE CULTURE SPEC B  Final   Gram Stain NO WBC SEEN FEW GRAM POSITIVE COCCI   Final   Culture   Final    FEW STAPHYLOCOCCUS AUREUS HOLDING FOR POSSIBLE ANAEROBE    Report Status PENDING  Incomplete   Organism ID, Bacteria STAPHYLOCOCCUS AUREUS  Final      Susceptibility   Staphylococcus aureus - MIC*    CIPROFLOXACIN <=0.5 SENSITIVE Sensitive     ERYTHROMYCIN >=8 RESISTANT Resistant     GENTAMICIN <=0.5 SENSITIVE Sensitive     OXACILLIN <=0.25 SENSITIVE Sensitive     TETRACYCLINE >=16 RESISTANT Resistant     VANCOMYCIN <=0.5 SENSITIVE Sensitive     TRIMETH/SULFA <=10 SENSITIVE Sensitive     CLINDAMYCIN RESISTANT Resistant     RIFAMPIN <=0.5 SENSITIVE Sensitive     Inducible Clindamycin Value in next row Resistant      POSITIVEPerformed at [facility], [address] [place], [place] [address]    * FEW STAPHYLOCOCCUS AUREUS  Surgical pcr screen     Status: Abnormal   Collection Time: 08/12/20  2:20 AM   Specimen: Nasal Mucosa; Nasal Swab  Result Value Ref Range Status   MRSA, PCR NEGATIVE NEGATIVE Final   Staphylococcus aureus POSITIVE (A) NEGATIVE Final    Comment: (NOTE) The Xpert SA Assay (FDA approved for NASAL specimens in  patients 69 years of age and older), is one component of a comprehensive surveillance program. It is not intended to diagnose infection nor to guide or monitor treatment. Performed at [facility], [address] [place], [place] [address]   Aerobic/Anaerobic Culture (surgical/deep wound)     Status: None (Preliminary result)   Collection Time: 08/12/20 10:54 AM   Specimen: Wound; Abscess  Result Value Ref Range Status   Specimen Description WOUND RIGHT FOOT  Final   Special Requests SAMPLE A PREWASH  Final   Gram Stain NO WBC SEEN RARE GRAM POSITIVE COCCI IN PAIRS   Final   Culture   Final    CULTURE REINCUBATED FOR BETTER GROWTH Performed at [facility], [address] [place], [place] [address]    Report Status PENDING  Incomplete  Aerobic/Anaerobic Culture (surgical/deep wound)     Status: None (Preliminary result)   Collection Time: 08/12/20 10:55 AM   Specimen: Wound; Abscess  Result Value Ref Range Status   Specimen Description WOUND RIGHT FOOT  Final   Special Requests SAMPLE B POST WASH  Final   Gram Stain NO WBC SEEN NO ORGANISMS SEEN   Final   Culture   Final    NO GROWTH 2 DAYS Performed at [facility], [address] [place], [place] [address]    Report Status PENDING  Incomplete         Radiology Studies: ECHOCARDIOGRAM COMPLETE  Result Date: 08/14/2020    ECHOCARDIOGRAM REPORT   Patient Name:   JDYN PARKERSON Date of Exam: 08/14/2020 Medical Rec #:  [id number]     Height:       70.0 in Accession #:    [id number]    Weight:       180.0 lb Date of Birth:  1951/05/27     BSA:          1.996 m Patient Age:    69 years      BP:           138/81 mmHg Patient Gender: M             HR:           70 bpm. Exam Location:  Inpatient Procedure: 2D Echo, Cardiac Doppler and Color Doppler Indications:    Atrial fibrillation  History:        Patient has no prior history of Echocardiogram examinations.                 Arrythmias:Atrial Fibrillation; Risk  Factors:Hypertension and                 Diabetes.  Sonographer:    Lavenia Atlas Referring Phys: [id number] MAURICIO DANIEL ARRIEN IMPRESSIONS  1. Left ventricular ejection fraction, by estimation, is 55 to 60%. The left ventricle has normal function. The left ventricle has no regional wall motion abnormalities. There is severe concentric left ventricular hypertrophy. Left ventricular diastolic  function could not be evaluated.  2. Right ventricular systolic function is normal. The right ventricular size is normal. There is normal pulmonary artery systolic pressure.  3. The mitral valve is grossly normal. No evidence of mitral valve regurgitation. No evidence of mitral stenosis.  4. The aortic valve is normal in structure. Aortic valve regurgitation is not visualized. No aortic stenosis is present. FINDINGS  Left Ventricle: Left ventricular ejection fraction, by estimation, is 55 to 60%. The left ventricle has normal function. The left ventricle has no regional wall motion abnormalities. The left ventricular internal cavity size was normal in size. There is  severe concentric left ventricular hypertrophy. Left ventricular diastolic function could not be evaluated due to atrial fibrillation. Left ventricular diastolic function could not be evaluated. Right Ventricle: The right ventricular size is normal. No increase in right ventricular wall thickness. Right ventricular systolic function is normal. There is normal pulmonary artery systolic pressure. The tricuspid regurgitant velocity is 2.07 m/s, and  with an assumed right atrial pressure of 3 mmHg, the estimated right ventricular systolic pressure is 20.1 mmHg. Left Atrium: Left atrial size was normal in size. Right Atrium: Right atrial size was normal in size. Pericardium: There is no evidence of pericardial effusion. Mitral Valve: The mitral valve is grossly normal. No evidence of mitral valve regurgitation. No evidence of mitral valve stenosis. Tricuspid Valve:  The tricuspid valve is normal in structure. Tricuspid valve regurgitation is trivial. Aortic Valve: The aortic valve is normal in structure. Aortic valve regurgitation is not visualized. No aortic stenosis is present. Pulmonic Valve: The pulmonic valve was normal in structure. Pulmonic valve regurgitation is not visualized. Aorta: The aortic root and ascending aorta are structurally normal, with no evidence of dilitation. IAS/Shunts: The atrial septum is grossly normal.  LEFT VENTRICLE PLAX 2D LVIDd:  4.50 cm  Diastology LVIDs:         3.50 cm  LV e' medial:    6.96 cm/s LV PW:         1.50 cm  LV E/e' medial:  12.3 LV IVS:        1.80 cm  LV e' lateral:   9.90 cm/s LVOT diam:     2.30 cm  LV E/e' lateral: 8.6 LV SV:         68 LV SV Index:   34 LVOT Area:     4.15 cm  RIGHT VENTRICLE RV Basal diam:  2.50 cm RV S prime:     14.40 cm/s TAPSE (M-mode): 2.7 cm LEFT ATRIUM             Index       RIGHT ATRIUM           Index LA diam:        3.50 cm 1.75 cm/m  RA Area:     14.00 cm LA Vol (A2C):   49.1 ml 24.60 ml/m RA Volume:   31.50 ml  15.78 ml/m LA Vol (A4C):   43.4 ml 21.74 ml/m LA Biplane Vol: 49.1 ml 24.60 ml/m  AORTIC VALVE LVOT Vmax:   80.50 cm/s LVOT Vmean:  53.500 cm/s LVOT VTI:    0.163 m  AORTA Ao Root diam: 3.60 cm MITRAL VALVE               TRICUSPID VALVE MV Area (PHT): 3.72 cm    TR Peak grad:   17.1 mmHg MV Decel Time: 204 msec    TR Vmax:        207.00 cm/s MV E velocity: 85.40 cm/s MV A velocity: 50.90 cm/s  SHUNTS MV E/A ratio:  1.68        Systemic VTI:  0.16 m                            Systemic Diam: 2.30 cm Kristeen Miss MD Electronically signed by Kristeen Miss MD Signature Date/Time: 08/14/2020/10:26:34 AM    Final         Scheduled Meds: . Chlorhexidine Gluconate Cloth  6 each Topical Q0600  . insulin aspart  0-15 Units Subcutaneous TID WC  . insulin aspart  5 Units Subcutaneous TID WC  . insulin glargine  10 Units Subcutaneous QHS  . levothyroxine  25 mcg Oral Q0600    . linagliptin  5 mg Oral Daily  . metFORMIN  1,000 mg Oral BID WC  . metoprolol succinate  100 mg Oral Daily  . mupirocin ointment  1 application Nasal BID  . polyethylene glycol  17 g Oral BID  . sodium chloride flush  3 mL Intravenous Q12H   Continuous Infusions: . sodium chloride Stopped (08/12/20 0822)  .  ceFAZolin (ANCEF) IV 2 g (08/14/20 0548)     LOS: 5 days    Time spent: 39 minutes spent on chart review, discussion with nursing staff, consultants, updating family and interview/physical exam; more than 50% of that time was spent in counseling and/or coordination of care.    Alvira Philips [place], DO [facility] Available via Epic secure chat 7am-7pm After these hours, please refer to coverage provider listed on [email or web address] 08/14/2020, 2:49 PM

## 2020-08-14 NOTE — Consult Note (Signed)
[facility]    Date of Admission:  08/08/2020    Total days of antibiotics 6        Day 2 cefazolin              Reason for Consult: Right diabetic foot infection with abscess    Referring Provider: Dr. Eric [place]  Assessment: Dean Rasmussen has a long smoldering right diabetic foot infection with a large plantar abscess.  Although the recent MRI raised concern about bone and joint infection that did not appear to be the case at the time of surgery or pathology review.  Nonetheless it is a severe and potentially limb threatening infection.  As expected it is a mixed aerobic and anaerobic affair.  I discussed management options with Dean Rasmussen and also spoke to his wife by phone.  After reviewing both oral and IV options we have agreed on PICC placement and treatment initially with IV ertapenem.  Plan: 1. PICC placement 2. Change cefazolin to IV ertapenem  Principal Problem:   Abscess of right foot Active Problems:   Open wound of right foot with complication   Essential hypertension   Hypertensive left ventricular hypertrophy, without heart failure   Type 2 diabetes mellitus without complication, without long-term current use of insulin (HCC)   Ventricular ectopy   Diabetic neuropathy (HCC)   Charcot's arthropathy   Normocytic anemia   Atrial fibrillation (HCC)   Scheduled Meds: . Chlorhexidine Gluconate Cloth  6 each Topical Q0600  . insulin aspart  0-15 Units Subcutaneous TID WC  . insulin aspart  5 Units Subcutaneous TID WC  . insulin glargine  10 Units Subcutaneous QHS  . levothyroxine  25 mcg Oral Q0600  . linagliptin  5 mg Oral Daily  . metFORMIN  1,000 mg Oral BID WC  . metoprolol succinate  100 mg Oral Daily  . mupirocin ointment  1 application Nasal BID  . polyethylene glycol  17 g Oral BID  . sodium chloride flush  3 mL Intravenous Q12H   Continuous Infusions: . sodium chloride Stopped (08/12/20 0822)  .  ceFAZolin (ANCEF)  IV 2 g (08/14/20 0548)   PRN Meds:.sodium chloride, acetaminophen **OR** acetaminophen, bisacodyl, HYDROcodone-acetaminophen, sodium chloride flush  HPI: Dean Rasmussen is a 69 y.o. 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  He and his wife recently moved here from [place].  They have only been here for 1 week.  They have been staying in a hotel while there furniture is moved into their apartment.  She recently noticed something on his right foot.  He has been hiding from her for several months brought in to the emergency department on 08/07/2020.  He told the ED staff there that for several months he has had an enlarging nodule on the medial plantar aspect of his right foot.  It has become more painful recently that about 2 months ago it ulcerated and started to ooze malodorous fluid.  He was try to keep it clean and covered so she would not learn until after they have made their move.  He was referred from the ED to Dr. Loreta Ave who performed an MRI which showed a large abscess on the plantar surface of his foot and question early changes of septic arthritis in the first TMT joint and possible osteomyelitis in the first metatarsal base and medial cuneiform bone.  He was admitted on 08/09/2020 and  underwent surgery where the large abscess was drained and debrided.  The areas of questionable pretty healthy.  A bone biopsy showed benign bone on pathology review.  Abscess and bone cultures are growing MSSA, strep anginosis and Bacteroides fragilis.  He went back to the OR for further debridement and wound closure on 08/12/2020.  He is a never smoker.  His hemoglobin A1c is 9.  His wife is a Company secretarylongtime nurse with ICU and oncology experience.  She is currently working for [employer].   Review of Systems: Review of Systems  Constitutional: Negative for chills, diaphoresis, fever and weight loss.  Respiratory: Negative for cough, sputum production and shortness of breath.     Cardiovascular: Negative for chest pain.  Gastrointestinal: Negative for abdominal pain, diarrhea, nausea and vomiting.  Musculoskeletal: Positive for joint pain.    Past Medical History:  Diagnosis Date  . Diabetes mellitus without complication (HCC)   . Hypertension     Social History   Tobacco Use  . Smoking status: Never Smoker  . Smokeless tobacco: Never Used  Vaping Use  . Vaping Use: Never used  Substance Use Topics  . Alcohol use: Yes    Comment: occasional  . Drug use: Never    History reviewed. No pertinent family history. No Known Allergies  OBJECTIVE: Blood pressure 138/81, pulse 70, temperature 97.8 F (36.6 C), temperature source Oral, resp. rate 17, height 5\' 10"  (1.778 m), weight 81.6 kg, SpO2 100 %.  Physical Exam Constitutional:      Comments: He is resting quietly in bed.  He is very pleasant.  Cardiovascular:     Rate and Rhythm: Normal rate and regular rhythm.     Heart sounds: No murmur heard.   Pulmonary:     Effort: Pulmonary effort is normal.     Breath sounds: Normal breath sounds.  Abdominal:     Palpations: Abdomen is soft.     Tenderness: There is no abdominal tenderness.  Musculoskeletal:     Comments: His right foot is wrapped.  Psychiatric:        Mood and Affect: Mood normal.     Lab Results Lab Results  Component Value Date   WBC 9.3 08/13/2020   HGB 11.1 (L) 08/13/2020   HCT 34.0 (L) 08/13/2020   MCV 93.2 08/13/2020   PLT 207 08/13/2020    Lab Results  Component Value Date   CREATININE 1.21 08/14/2020   BUN 19 08/14/2020   NA 135 08/14/2020   K 3.6 08/14/2020   CL 101 08/14/2020   CO2 29 08/14/2020    Lab Results  Component Value Date   ALT 16 08/10/2020   AST 12 (L) 08/10/2020   ALKPHOS 53 08/10/2020   BILITOT 1.0 08/10/2020    Sed Rate (mm/hr)  Date Value  08/09/2020 54 (H)    Microbiology: Recent Results (from the past 240 hour(s))  WOUND CULTURE     Status: Abnormal   Collection Time: 08/07/20  10:31 AM   Specimen: Foot, Right; Wound  Result Value Ref Range Status   MICRO NUMBER: [id number]  Final   SPECIMEN QUALITY: Adequate  Final   SOURCE: WOUND (SITE NOT SPECIFIED)  Final   STATUS: FINAL  Final   GRAM STAIN:   Final    No white blood cells seen No epithelial cells seen Moderate Gram negative bacilli   RESULT:   Final    Additional organisms of questionable significance were isolated that normally do not warrant identification and susceptibilities. Please  contact the laboratory within three days if identification and susceptibilities are clinically indicated.   ISOLATE 1: Staphylococcus aureus (A)  Final    Comment: Heavy growth of Staphylococcus aureus This isolate demonstrates inducible clindamycin resistance.      Susceptibility   Staphylococcus aureus - AEROBIC CULT, GRAM STAIN POSITIVE 1    VANCOMYCIN <=0.5 Sensitive     CIPROFLOXACIN <=0.5 Sensitive     CLINDAMYCIN <=0.25 Resistant     LEVOFLOXACIN 0.25 Sensitive     ERYTHROMYCIN >=8 Resistant     GENTAMICIN <=0.5 Sensitive     OXACILLIN 0.5 Sensitive     TETRACYCLINE >=16 Resistant     TRIMETH/SULFA* <=10 Sensitive      * Legend:S = Susceptible  I = IntermediateR = Resistant  NS = Not susceptible* = Not tested  NR = Not reported**NN = See antimicrobic comments  Culture, blood (Routine x 2)     Status: None   Collection Time: 08/08/20  8:50 PM   Specimen: BLOOD RIGHT ARM  Result Value Ref Range Status   Specimen Description BLOOD RIGHT ARM  Final   Special Requests   Final    BOTTLES DRAWN AEROBIC AND ANAEROBIC Blood Culture adequate volume   Culture   Final    NO GROWTH 5 DAYS Performed at [facility], [address], [place], [place] [address]    Report Status 08/13/2020 FINAL  Final  Culture, blood (Routine x 2)     Status: None   Collection Time: 08/08/20  8:56 PM   Specimen: BLOOD RIGHT HAND  Result Value Ref Range Status   Specimen Description BLOOD RIGHT HAND  Final   Special Requests   Final      BOTTLES DRAWN AEROBIC ONLY Blood Culture results may not be optimal due to an inadequate volume of blood received in culture bottles   Culture   Final    NO GROWTH 5 DAYS Performed at [facility], [address], [place], [place] [address]    Report Status 08/13/2020 FINAL  Final  SARS Coronavirus 2 by RT PCR (hospital order, performed in [facility] hospital lab) Nasopharyngeal Nasopharyngeal Swab     Status: None   Collection Time: 08/09/20 11:56 AM   Specimen: Nasopharyngeal Swab  Result Value Ref Range Status   SARS Coronavirus 2 NEGATIVE NEGATIVE Final    Comment: (NOTE) SARS-CoV-2 target nucleic acids are NOT DETECTED.  The SARS-CoV-2 RNA is generally detectable in upper and lower respiratory specimens during the acute phase of infection. The lowest concentration of SARS-CoV-2 viral copies this assay can detect is 250 copies / mL. A negative result does not preclude SARS-CoV-2 infection and should not be used as the sole basis for treatment or other patient management decisions.  A negative result may occur with improper specimen collection / handling, submission of specimen other than nasopharyngeal swab, presence of viral mutation(s) within the areas targeted by this assay, and inadequate number of viral copies (<250 copies / mL). A negative result must be combined with clinical observations, patient history, and epidemiological information.  Fact Sheet for Patients:   BoilerBrush.com.cy  Fact Sheet for Healthcare Providers: https://pope.com/  This test is not yet approved or  cleared by the [place] FDA and has been authorized for detection and/or diagnosis of SARS-CoV-2 by FDA under an Emergency Use Authorization (EUA).  This EUA will remain in effect (meaning this test can be used) for the duration of the COVID-19 declaration under Section 564(b)(1) of  the Act, 21 U.S.C. section 360bbb-3(b)(1), unless the  authorization is terminated or revoked sooner.  Performed at [facility], [address] [place], [place] [address]   Aerobic/Anaerobic Culture (surgical/deep wound)     Status: None   Collection Time: 08/09/20  6:27 PM   Specimen: PATH Other; Tissue  Result Value Ref Range Status   Specimen Description ABSCESS RIGHT FOOT  Final   Special Requests SPEC A  Final   Gram Stain   Final    FEW WBC PRESENT, PREDOMINANTLY PMN ABUNDANT GRAM POSITIVE COCCI ABUNDANT GRAM NEGATIVE RODS    Culture   Final    ABUNDANT STREPTOCOCCUS ANGINOSIS MODERATE STAPHYLOCOCCUS AUREUS ABUNDANT BACTEROIDES FRAGILIS BETA LACTAMASE POSITIVE Performed at [facility], [address] [place], [place] [address]    Report Status 08/14/2020 FINAL  Final   Organism ID, Bacteria STREPTOCOCCUS ANGINOSIS  Final   Organism ID, Bacteria STAPHYLOCOCCUS AUREUS  Final      Susceptibility   Staphylococcus aureus - MIC*    CIPROFLOXACIN <=0.5 SENSITIVE Sensitive     ERYTHROMYCIN >=8 RESISTANT Resistant     GENTAMICIN <=0.5 SENSITIVE Sensitive     OXACILLIN <=0.25 SENSITIVE Sensitive     TETRACYCLINE >=16 RESISTANT Resistant     VANCOMYCIN <=0.5 SENSITIVE Sensitive     TRIMETH/SULFA <=10 SENSITIVE Sensitive     CLINDAMYCIN RESISTANT Resistant     RIFAMPIN <=0.5 SENSITIVE Sensitive     Inducible Clindamycin POSITIVE Resistant     * MODERATE STAPHYLOCOCCUS AUREUS   Streptococcus anginosis - MIC*    PENICILLIN <=0.06 SENSITIVE Sensitive     CEFTRIAXONE 0.25 SENSITIVE Sensitive     ERYTHROMYCIN 2 RESISTANT Resistant     LEVOFLOXACIN 0.5 SENSITIVE Sensitive     VANCOMYCIN 0.5 SENSITIVE Sensitive     * ABUNDANT STREPTOCOCCUS ANGINOSIS  Aerobic/Anaerobic Culture (surgical/deep wound)     Status: None (Preliminary result)   Collection Time: 08/09/20  6:34 PM   Specimen: PATH Other; Tissue  Result Value Ref Range Status   Specimen Description TISSUE  Final   Special Requests FIRST METATARSAL BONE CULTURE  SPEC B  Final   Gram Stain NO WBC SEEN FEW GRAM POSITIVE COCCI   Final   Culture   Final    FEW STAPHYLOCOCCUS AUREUS HOLDING FOR POSSIBLE ANAEROBE    Report Status PENDING  Incomplete   Organism ID, Bacteria STAPHYLOCOCCUS AUREUS  Final      Susceptibility   Staphylococcus aureus - MIC*    CIPROFLOXACIN <=0.5 SENSITIVE Sensitive     ERYTHROMYCIN >=8 RESISTANT Resistant     GENTAMICIN <=0.5 SENSITIVE Sensitive     OXACILLIN <=0.25 SENSITIVE Sensitive     TETRACYCLINE >=16 RESISTANT Resistant     VANCOMYCIN <=0.5 SENSITIVE Sensitive     TRIMETH/SULFA <=10 SENSITIVE Sensitive     CLINDAMYCIN RESISTANT Resistant     RIFAMPIN <=0.5 SENSITIVE Sensitive     Inducible Clindamycin Value in next row Resistant      POSITIVEPerformed at [facility], [address], [place], [place] [address]    * FEW STAPHYLOCOCCUS AUREUS  Surgical pcr screen     Status: Abnormal   Collection Time: 08/12/20  2:20 AM   Specimen: Nasal Mucosa; Nasal Swab  Result Value Ref Range Status   MRSA, PCR NEGATIVE NEGATIVE Final   Staphylococcus aureus POSITIVE (A) NEGATIVE Final    Comment: (NOTE) The Xpert SA Assay (FDA approved for NASAL specimens in patients 72 years of age and older),  is one component of a comprehensive surveillance program. It is not intended to diagnose infection nor to guide or monitor treatment. Performed at [facility], [address] [place], [place] [address]   Aerobic/Anaerobic Culture (surgical/deep wound)     Status: None (Preliminary result)   Collection Time: 08/12/20 10:54 AM   Specimen: Wound; Abscess  Result Value Ref Range Status   Specimen Description WOUND RIGHT FOOT  Final   Special Requests SAMPLE A PREWASH  Final   Gram Stain NO WBC SEEN RARE GRAM POSITIVE COCCI IN PAIRS   Final   Culture   Final    CULTURE REINCUBATED FOR BETTER GROWTH Performed at [facility], [address] [place], [place] [address]    Report Status PENDING  Incomplete   Aerobic/Anaerobic Culture (surgical/deep wound)     Status: None (Preliminary result)   Collection Time: 08/12/20 10:55 AM   Specimen: Wound; Abscess  Result Value Ref Range Status   Specimen Description WOUND RIGHT FOOT  Final   Special Requests SAMPLE B POST WASH  Final   Gram Stain NO WBC SEEN NO ORGANISMS SEEN   Final   Culture   Final    NO GROWTH 2 DAYS Performed at [facility], [address], [place], [place] [address]    Report Status PENDING  Incomplete    Cliffton Asters, MD [facility] for Infectious Disease [facility] [phone] pager   [phone] cell 08/14/2020, 3:13 PM

## 2020-08-14 NOTE — Telephone Encounter (Signed)
Pt is in the hospital and wife would like to talk with you bc ID is wanting to place a PICC line and she wants to make sure you are aware and talk with you before they place it. Thanks.   Call Back # [phone]

## 2020-08-14 NOTE — Progress Notes (Signed)
  Echocardiogram 2D Echocardiogram has been performed.  Dean Rasmussen 08/14/2020, 9:18 AM

## 2020-08-15 ENCOUNTER — Inpatient Hospital Stay (HOSPITAL_COMMUNITY): Payer: No Typology Code available for payment source

## 2020-08-15 LAB — GLUCOSE, CAPILLARY
Glucose-Capillary: 128 mg/dL — ABNORMAL HIGH (ref 70–99)
Glucose-Capillary: 152 mg/dL — ABNORMAL HIGH (ref 70–99)
Glucose-Capillary: 127 mg/dL — ABNORMAL HIGH (ref 70–99)
Glucose-Capillary: 157 mg/dL — ABNORMAL HIGH (ref 70–99)

## 2020-08-15 LAB — AEROBIC/ANAEROBIC CULTURE W GRAM STAIN (SURGICAL/DEEP WOUND): Gram Stain: NONE SEEN

## 2020-08-15 LAB — CBC
HCT: 32.6 % — ABNORMAL LOW (ref 39.0–52.0)
Hemoglobin: 10.6 g/dL — ABNORMAL LOW (ref 13.0–17.0)
MCH: 30.4 pg (ref 26.0–34.0)
MCHC: 32.5 g/dL (ref 30.0–36.0)
MCV: 93.4 fL (ref 80.0–100.0)
Platelets: 228 10*3/uL (ref 150–400)
RBC: 3.49 MIL/uL — ABNORMAL LOW (ref 4.22–5.81)
RDW: 12.5 % (ref 11.5–15.5)
WBC: 8.2 10*3/uL (ref 4.0–10.5)
nRBC: 0 % (ref 0.0–0.2)

## 2020-08-15 LAB — BASIC METABOLIC PANEL
Anion gap: 9 (ref 5–15)
BUN: 17 mg/dL (ref 8–23)
CO2: 29 mmol/L (ref 22–32)
Calcium: 8.3 mg/dL — ABNORMAL LOW (ref 8.9–10.3)
Chloride: 99 mmol/L (ref 98–111)
Creatinine, Ser: 1.11 mg/dL (ref 0.61–1.24)
GFR calc Af Amer: >60 mL/min (ref >60)
GFR calc non Af Amer: >60 mL/min (ref >60)
Glucose, Bld: 134 mg/dL — ABNORMAL HIGH (ref 70–99)
Potassium: 3.2 mmol/L — ABNORMAL LOW (ref 3.5–5.1)
Sodium: 137 mmol/L (ref 135–145)

## 2020-08-15 MED ORDER — POTASSIUM CHLORIDE CRYS ER 20 MEQ PO TBCR
40.0000 meq | EXTENDED_RELEASE_TABLET | ORAL | Status: AC
  Administered 2020-08-15 (×2): 40 meq via ORAL
  Filled 2020-08-15 (×2): qty 2

## 2020-08-15 MED ORDER — SODIUM CHLORIDE 0.9% FLUSH
10.0000 mL | Freq: Two times a day (BID) | INTRAVENOUS | Status: DC
  Administered 2020-08-16 (×2): 10 mL

## 2020-08-15 MED ORDER — INSULIN GLARGINE 100 UNIT/ML ~~LOC~~ SOLN
15.0000 [IU] | Freq: Every day | SUBCUTANEOUS | Status: DC
  Administered 2020-08-15: 15 [IU] via SUBCUTANEOUS
  Filled 2020-08-15 (×2): qty 0.15

## 2020-08-15 MED ORDER — SODIUM CHLORIDE 0.9% FLUSH: 10.0000 mL | INTRAVENOUS | Status: DC | PRN

## 2020-08-15 NOTE — Progress Notes (Signed)
1230 awaiting wife's arrival to decide if PICC is to be placed.

## 2020-08-15 NOTE — Plan of Care (Signed)

## 2020-08-15 NOTE — Plan of Care (Signed)

## 2020-08-15 NOTE — Progress Notes (Signed)
Wife has been concerned of pt having a sustained A-fib last night from  cardiac monitoring reading. Pt denies chest pain, dizziness or headache. V/S remains WNL. Pulse rate form 60-80's. No acute changes verbalized and noted. He remains alert and orientedx4. Breathing even and unlabored. Pt is quite anxious regarding the situation, education and reassurance provided. Notified On call regarding concerns without new order. Pt's wife will be here this AM to communicate further with attending physician regarding plan of care. Oncoming nurse made aware.

## 2020-08-15 NOTE — Progress Notes (Signed)
PROGRESS NOTE    Dean Rasmussen  VHQ:[id number] DOB: Jan 15, 1951 DOA: 08/08/2020 PCP: Patient, [name]    Brief Narrative:  Dean Rasmussen is a 69 year old male with past medical history notable for uncontrolled type 2 diabetes mellitus, hypertension who presented to the ED with progressive 3-week history of erythema, edema and pain to the right midfoot.  Recent outpatient evaluation with MRI showed large abscess with possible osteomyelitis.  In the ED, BP 137/81, HR 94, RR 22, temperature 98.4, SPO2 93% on room air.  Sodium 136, potassium 4.0, BUN 32, creatinine 1.12, WBC count 21.4, hemoglobin 12.0, platelets 240.  SARS-CoV-2 negative.  Podiatry was consulted, TRH consulted for admission.   Assessment & Plan:   Principal Problem:   Abscess of right foot Active Problems:   Essential hypertension   Hypertensive left ventricular hypertrophy, without heart failure   Type 2 diabetes mellitus without complication, without long-term current use of insulin (HCC)   Ventricular ectopy   Open wound of right foot with complication   Diabetic neuropathy (HCC)   Charcot's arthropathy   Normocytic anemia   Atrial fibrillation (HCC)   Diabetic foot abscess with suspected osteomyelitis Patient presented with 3-week history of progressive right foot swelling, erythema and pain.  MR right foot with soft tissue ulceration plantar aspect right foot with sinus tract extending plantar aspect first TMT joint, suspect septic arthritis of first TMT joint, signal changes plantar aspect first metatarsal base and medial cuneiform compatible with acute osteomyelitis, large fluid and air-containing collection medial/dorsal aspect of midfoot measuring up to 6.7 cm consistent with abscess, subchondral marrow changes, and fatty atrophy with diffuse edema-like signal suggesting denervation changes and or myositis.  Podiatry was consulted and underwent irrigation and debridement with antibiotic bead placement by Dr.  Lilian Kapur on 08/12/2020 followed by secondary wound closure with antibiotic bead removal on 08/12/2020. --Podiatry/infectious disease following, appreciate assistance --Wound culture 9/10: MSSA --Wound culture 9/10: MSSA, Streptococcus anginosis, bacteroides fragilis --Bone biopsy: No active osteomyelitis but with MSSA  --Continue antibiotics with ertapenem 1 g IV every 24 hours, plan for week course per ID --Continue nonweightbearing right lower extremity per podiatry --Continue PT/OT efforts  Type 2 diabetes mellitus, uncontrolled Hemoglobin A1c 9.0.  On glimepiride 4 mg p.o. daily, Metformin 2000 mg p.o. daily, and Januvia 100 mg p.o. daily at home. --Hold oral Metformin and Amaryl while inpatient --Continue Tradjenta as substitution for home Januvia while inpatient --Lantus 15 units [place] qHS --NovoLog 5 TIDAC --Insulin sliding scale for further coverage --CBGs qAC/HS  Paroxysmal atrial fibrillation, new onset In the operating room, patient was noted to have episode of atrial fibrillation which has spontaneously resolved.  CHA2DS2-VASc score = 3 for history of hypertension, age, and diabetes history correlating to a 3.2% stroke risk per year. --Continue metoprolol succinate 100 g p.o. daily for rate control --Continue to monitor on telemetry, review today with persistent A. Fib --Awaiting benefit check to see which anticoagulant is most affordable  Essential hypertension BP 118/77, well controlled. --Continue metoprolol succinate 100 mg p.o. daily  Hypothyroidism: Continue levothyroxine 25 mcg p.o. daily  Constipation --MiraLAX twice daily --Dulcolax suppository as needed   DVT prophylaxis: Lovenox Code Status: Full code Family Communication: Updated patient spouse Dean Rasmussen at bedside  Disposition Plan:  Status is: Inpatient  Remains inpatient appropriate because:Unsafe d/c plan, IV treatments appropriate due to intensity of illness or inability to take PO and Inpatient level of  care appropriate due to severity of illness   Dispo: The patient is from:  Home              Anticipated d/c is to: Home              Anticipated d/c date is: 1 day              Patient currently is not medically stable to d/c.   Consultants:   Podiatry  Infectious disease, Dr. Orvan Falconer  Procedures:   Irrigation debridement right foot with implant antibiotic beads, Dr. Lilian Kapur; 08/09/2020  Secondary wound closure with removal of antibiotic beads, Dr. Lilian Kapur on 08/12/2020  Antimicrobials:   Vancomycin 9/10 -9/14  Cefazolin 9/14 - 9/15  Metronidazole 9/10 -9/10  Ceftriaxone 9/10 - 9/10  Zosyn 9/10 -9/13  Ertapenem 9/15>>    Subjective: Patient seen and examined bedside, resting comfortably.  No complaints this morning.  Seen by ID with plan for week antibiotic course with ertapenem with PICC line placement.  Discussed with patient and spouse present at bedside.  Also with new onset atrial fibrillation with has been persistent now over the past 1-2 days.  Awaiting benefit check to determine which anticoagulant place.  Social work also having difficulty finding home health agency to service.  No other complaints or concerns at this time. Denies headache, no fever/chills/night sweats, no nausea/vomiting/diarrhea, no chest pain, no palpitations, no shortness of breath, no abdominal pain, no weakness.  No acute events overnight per nursing staff.  Objective: Vitals:   08/14/20 1614 08/14/20 1916 08/15/20 0409 08/15/20 0742  BP: 137/85 122/76 118/77 118/72  Pulse: 80 74 70 75  Resp: 20 18 16 18   Temp: 99.2 F (37.3 C) 99.1 F (37.3 C) 98.7 F (37.1 C) 97.7 F (36.5 C)  TempSrc: Oral Oral Oral Oral  SpO2: 100% 99% 99% 98%  Weight:      Height:        Intake/Output Summary (Last 24 hours) at 08/15/2020 1418 Last data filed at 08/15/2020 1100 Gross per 24 hour  Intake 203 ml  Output 1390 ml  Net -1187 ml   Filed Weights   08/09/20 1128  Weight: 81.6 kg     Examination:  General exam: Appears calm and comfortable  Respiratory system: Clear to auscultation. Respiratory effort normal. Cardiovascular system: S1 & S2 heard, irregularly irregular rhythm, normal rate. No JVD, murmurs, rubs, gallops or clicks. No pedal edema. Gastrointestinal system: Abdomen is nondistended, soft and nontender. No organomegaly or masses felt. Normal bowel sounds heard. Central nervous system: Alert and oriented. No focal neurological deficits. Extremities: Symmetric 5 x 5 power. Skin: No rashes, lesions or ulcers Psychiatry: Judgement and insight appear normal. Mood & affect appropriate.     Data Reviewed: I have personally reviewed following labs and imaging studies  CBC: Recent Labs  Lab 08/08/20 2055 08/09/20 0919 08/10/20 0628 08/11/20 0431 08/13/20 0517 08/13/20 1254 08/15/20 0728  WBC 21.4*   < > 15.1* 11.7* 9.3 9.3 8.2  NEUTROABS 19.9*  --   --   --  7.6 7.7  --   HGB 12.0*   < > 11.3* 10.8* 10.2* 11.1* 10.6*  HCT 36.8*   < > 33.7* 32.2* 31.3* 34.0* 32.6*  MCV 94.4   < > 93.1 94.7 93.4 93.2 93.4  PLT 240   < > 198 187 181 207 228   < > = values in this interval not displayed.   Basic Metabolic Panel: Recent Labs  Lab 08/10/20 0628 08/11/20 0431 08/13/20 0517 08/14/20 0441 08/15/20 0728  NA 134* 136 135 135  137  K 3.8 3.8 3.8 3.6 3.2*  CL 100 101 98 101 99  CO2 25 29 30 29 29   GLUCOSE 214* 236* 211* 216* 134*  BUN 18 17 13 19 17   CREATININE 1.05 1.17 1.13 1.21 1.11  CALCIUM 8.3* 8.3* 8.3* 8.3* 8.3*   GFR: Estimated Creatinine Clearance: 64.9 mL/min (by C-G formula based on SCr of 1.11 mg/dL). Liver Function Tests: Recent Labs  Lab 08/08/20 2055 08/10/20 0628  AST 17 12*  ALT 24 16  ALKPHOS 58 53  BILITOT 0.9 1.0  PROT 6.5 5.2*  ALBUMIN 3.3* 2.4*   No results for input(s): LIPASE, AMYLASE in the last 168 hours. No results for input(s): AMMONIA in the last 168 hours. Coagulation Profile: Recent Labs  Lab  08/08/20 2055 08/09/20 0919  INR 1.0 1.0   Cardiac Enzymes: No results for input(s): CKTOTAL, CKMB, CKMBINDEX, TROPONINI in the last 168 hours. BNP (last 3 results) No results for input(s): PROBNP in the last 8760 hours. HbA1C: No results for input(s): HGBA1C in the last 72 hours. CBG: Recent Labs  Lab 08/14/20 1140 08/14/20 1819 08/14/20 2111 08/15/20 0640 08/15/20 1145  GLUCAP 207* 342* 188* 128* 152*   Lipid Profile: No results for input(s): CHOL, HDL, LDLCALC, TRIG, CHOLHDL, LDLDIRECT in the last 72 hours. Thyroid Function Tests: No results for input(s): TSH, T4TOTAL, FREET4, T3FREE, THYROIDAB in the last 72 hours. Anemia Panel: No results for input(s): VITAMINB12, FOLATE, FERRITIN, TIBC, IRON, RETICCTPCT in the last 72 hours. Sepsis Labs: Recent Labs  Lab 08/08/20 2055  LATICACIDVEN 1.2    Recent Results (from the past 240 hour(s))  WOUND CULTURE     Status: Abnormal   Collection Time: 08/07/20 10:31 AM   Specimen: Foot, Right; Wound  Result Value Ref Range Status   MICRO NUMBER: [id number]  Final   SPECIMEN QUALITY: Adequate  Final   SOURCE: WOUND (SITE NOT SPECIFIED)  Final   STATUS: FINAL  Final   GRAM STAIN:   Final    No white blood cells seen No epithelial cells seen Moderate Gram negative bacilli   RESULT:   Final    Additional organisms of questionable significance were isolated that normally do not warrant identification and susceptibilities. Please contact the laboratory within three days if identification and susceptibilities are clinically indicated.   ISOLATE 1: Staphylococcus aureus (A)  Final    Comment: Heavy growth of Staphylococcus aureus This isolate demonstrates inducible clindamycin resistance.      Susceptibility   Staphylococcus aureus - AEROBIC CULT, GRAM STAIN POSITIVE 1    VANCOMYCIN <=0.5 Sensitive     CIPROFLOXACIN <=0.5 Sensitive     CLINDAMYCIN <=0.25 Resistant     LEVOFLOXACIN 0.25 Sensitive     ERYTHROMYCIN >=8 Resistant      GENTAMICIN <=0.5 Sensitive     OXACILLIN 0.5 Sensitive     TETRACYCLINE >=16 Resistant     TRIMETH/SULFA* <=10 Sensitive      * Legend:S = Susceptible  I = IntermediateR = Resistant  NS = Not susceptible* = Not tested  NR = Not reported**NN = See antimicrobic comments  Culture, blood (Routine x 2)     Status: None   Collection Time: 08/08/20  8:50 PM   Specimen: BLOOD RIGHT ARM  Result Value Ref Range Status   Specimen Description BLOOD RIGHT ARM  Final   Special Requests   Final    BOTTLES DRAWN AEROBIC AND ANAEROBIC Blood Culture adequate volume   Culture   Final  NO GROWTH 5 DAYS Performed at [facility], [address], [place], [place] [address]    Report Status 08/13/2020 FINAL  Final  Culture, blood (Routine x 2)     Status: None   Collection Time: 08/08/20  8:56 PM   Specimen: BLOOD RIGHT HAND  Result Value Ref Range Status   Specimen Description BLOOD RIGHT HAND  Final   Special Requests   Final    BOTTLES DRAWN AEROBIC ONLY Blood Culture results may not be optimal due to an inadequate volume of blood received in culture bottles   Culture   Final    NO GROWTH 5 DAYS Performed at [facility], [address], [place], [place] [address]    Report Status 08/13/2020 FINAL  Final  SARS Coronavirus 2 by RT PCR (hospital order, performed in [facility] hospital lab) Nasopharyngeal Nasopharyngeal Swab     Status: None   Collection Time: 08/09/20 11:56 AM   Specimen: Nasopharyngeal Swab  Result Value Ref Range Status   SARS Coronavirus 2 NEGATIVE NEGATIVE Final    Comment: (NOTE) SARS-CoV-2 target nucleic acids are NOT DETECTED.  The SARS-CoV-2 RNA is generally detectable in upper and lower respiratory specimens during the acute phase of infection. The lowest concentration of SARS-CoV-2 viral copies this assay can detect is 250 copies / mL. A negative result does not preclude SARS-CoV-2 infection and should not be used as the sole basis for treatment or  other patient management decisions.  A negative result may occur with improper specimen collection / handling, submission of specimen other than nasopharyngeal swab, presence of viral mutation(s) within the areas targeted by this assay, and inadequate number of viral copies (<250 copies / mL). A negative result must be combined with clinical observations, patient history, and epidemiological information.  Fact Sheet for Patients:   BoilerBrush.com.cy  Fact Sheet for Healthcare Providers: https://pope.com/  This test is not yet approved or  cleared by the [place] FDA and has been authorized for detection and/or diagnosis of SARS-CoV-2 by FDA under an Emergency Use Authorization (EUA).  This EUA will remain in effect (meaning this test can be used) for the duration of the COVID-19 declaration under Section 564(b)(1) of the Act, 21 U.S.C. section 360bbb-3(b)(1), unless the authorization is terminated or revoked sooner.  Performed at [facility], [address] [place], [place] [address]   Aerobic/Anaerobic Culture (surgical/deep wound)     Status: None   Collection Time: 08/09/20  6:27 PM   Specimen: PATH Other; Tissue  Result Value Ref Range Status   Specimen Description ABSCESS RIGHT FOOT  Final   Special Requests SPEC A  Final   Gram Stain   Final    FEW WBC PRESENT, PREDOMINANTLY PMN ABUNDANT GRAM POSITIVE COCCI ABUNDANT GRAM NEGATIVE RODS    Culture   Final    ABUNDANT STREPTOCOCCUS ANGINOSIS MODERATE STAPHYLOCOCCUS AUREUS ABUNDANT BACTEROIDES FRAGILIS BETA LACTAMASE POSITIVE Performed at [facility] Lab, [address] [place], [place] [address]    Report Status 08/14/2020 FINAL  Final   Organism ID, Bacteria STREPTOCOCCUS ANGINOSIS  Final   Organism ID, Bacteria STAPHYLOCOCCUS AUREUS  Final      Susceptibility   Staphylococcus aureus - MIC*    CIPROFLOXACIN <=0.5 SENSITIVE Sensitive     ERYTHROMYCIN  >=8 RESISTANT Resistant     GENTAMICIN <=0.5 SENSITIVE Sensitive     OXACILLIN <=0.25 SENSITIVE Sensitive     TETRACYCLINE >=16 RESISTANT Resistant  VANCOMYCIN <=0.5 SENSITIVE Sensitive     TRIMETH/SULFA <=10 SENSITIVE Sensitive     CLINDAMYCIN RESISTANT Resistant     RIFAMPIN <=0.5 SENSITIVE Sensitive     Inducible Clindamycin POSITIVE Resistant     * MODERATE STAPHYLOCOCCUS AUREUS   Streptococcus anginosis - MIC*    PENICILLIN <=0.06 SENSITIVE Sensitive     CEFTRIAXONE 0.25 SENSITIVE Sensitive     ERYTHROMYCIN 2 RESISTANT Resistant     LEVOFLOXACIN 0.5 SENSITIVE Sensitive     VANCOMYCIN 0.5 SENSITIVE Sensitive     * ABUNDANT STREPTOCOCCUS ANGINOSIS  Aerobic/Anaerobic Culture (surgical/deep wound)     Status: None   Collection Time: 08/09/20  6:34 PM   Specimen: PATH Other; Tissue  Result Value Ref Range Status   Specimen Description TISSUE  Final   Special Requests FIRST METATARSAL BONE CULTURE SPEC B  Final   Gram Stain NO WBC SEEN FEW GRAM POSITIVE COCCI   Final   Culture   Final    FEW STAPHYLOCOCCUS AUREUS MODERATE BACTEROIDES FRAGILIS BETA LACTAMASE POSITIVE Performed at [facility] Lab, [address], [place], [place] [address]    Report Status 08/15/2020 FINAL  Final   Organism ID, Bacteria STAPHYLOCOCCUS AUREUS  Final      Susceptibility   Staphylococcus aureus - MIC*    CIPROFLOXACIN <=0.5 SENSITIVE Sensitive     ERYTHROMYCIN >=8 RESISTANT Resistant     GENTAMICIN <=0.5 SENSITIVE Sensitive     OXACILLIN <=0.25 SENSITIVE Sensitive     TETRACYCLINE >=16 RESISTANT Resistant     VANCOMYCIN <=0.5 SENSITIVE Sensitive     TRIMETH/SULFA <=10 SENSITIVE Sensitive     CLINDAMYCIN RESISTANT Resistant     RIFAMPIN <=0.5 SENSITIVE Sensitive     Inducible Clindamycin POSITIVE Resistant     * FEW STAPHYLOCOCCUS AUREUS  Surgical pcr screen     Status: Abnormal   Collection Time: 08/12/20  2:20 AM   Specimen: Nasal Mucosa; Nasal Swab  Result Value Ref Range Status    MRSA, PCR NEGATIVE NEGATIVE Final   Staphylococcus aureus POSITIVE (A) NEGATIVE Final    Comment: (NOTE) The Xpert SA Assay (FDA approved for NASAL specimens in patients 51 years of age and older), is one component of a comprehensive surveillance program. It is not intended to diagnose infection nor to guide or monitor treatment. Performed at [facility], [address], [place], [place] [address]   Aerobic/Anaerobic Culture (surgical/deep wound)     Status: None (Preliminary result)   Collection Time: 08/12/20 10:54 AM   Specimen: Wound; Abscess  Result Value Ref Range Status   Specimen Description WOUND RIGHT FOOT  Final   Special Requests SAMPLE A PREWASH  Final   Gram Stain   Final    NO WBC SEEN RARE GRAM POSITIVE COCCI IN PAIRS Performed at [facility], [address], [place], [place] [address]    Culture RARE STAPHYLOCOCCUS AUREUS  Final   Report Status PENDING  Incomplete  Aerobic/Anaerobic Culture (surgical/deep wound)     Status: None (Preliminary result)   Collection Time: 08/12/20 10:55 AM   Specimen: Wound; Abscess  Result Value Ref Range Status   Specimen Description WOUND RIGHT FOOT  Final   Special Requests SAMPLE B POST WASH  Final   Gram Stain NO WBC SEEN NO ORGANISMS SEEN   Final   Culture   Final    NO GROWTH 3 DAYS Performed at [facility] Lab, [address], [place], [place] [address]  Report Status PENDING  Incomplete         Radiology Studies: ECHOCARDIOGRAM COMPLETE  Result Date: 08/14/2020    ECHOCARDIOGRAM REPORT   Patient Name:   Dean Rasmussen Date of Exam: 08/14/2020 Medical Rec #:  [id number]     Height:       70.0 in Accession #:    [id number]    Weight:       180.0 lb Date of Birth:  11/09/1951     BSA:          1.996 m Patient Age:    69 years      BP:           138/81 mmHg Patient Gender: M             HR:           70 bpm. Exam Location:  Inpatient Procedure: 2D Echo, Cardiac Doppler and Color Doppler Indications:     Atrial fibrillation  History:        Patient has no prior history of Echocardiogram examinations.                 Arrythmias:Atrial Fibrillation; Risk Factors:Hypertension and                 Diabetes.  Sonographer:    Lavenia Atlas Referring Phys: [id number] MAURICIO DANIEL ARRIEN IMPRESSIONS  1. Left ventricular ejection fraction, by estimation, is 55 to 60%. The left ventricle has normal function. The left ventricle has no regional wall motion abnormalities. There is severe concentric left ventricular hypertrophy. Left ventricular diastolic  function could not be evaluated.  2. Right ventricular systolic function is normal. The right ventricular size is normal. There is normal pulmonary artery systolic pressure.  3. The mitral valve is grossly normal. No evidence of mitral valve regurgitation. No evidence of mitral stenosis.  4. The aortic valve is normal in structure. Aortic valve regurgitation is not visualized. No aortic stenosis is present. FINDINGS  Left Ventricle: Left ventricular ejection fraction, by estimation, is 55 to 60%. The left ventricle has normal function. The left ventricle has no regional wall motion abnormalities. The left ventricular internal cavity size was normal in size. There is  severe concentric left ventricular hypertrophy. Left ventricular diastolic function could not be evaluated due to atrial fibrillation. Left ventricular diastolic function could not be evaluated. Right Ventricle: The right ventricular size is normal. No increase in right ventricular wall thickness. Right ventricular systolic function is normal. There is normal pulmonary artery systolic pressure. The tricuspid regurgitant velocity is 2.07 m/s, and  with an assumed right atrial pressure of 3 mmHg, the estimated right ventricular systolic pressure is 20.1 mmHg. Left Atrium: Left atrial size was normal in size. Right Atrium: Right atrial size was normal in size. Pericardium: There is no evidence of pericardial  effusion. Mitral Valve: The mitral valve is grossly normal. No evidence of mitral valve regurgitation. No evidence of mitral valve stenosis. Tricuspid Valve: The tricuspid valve is normal in structure. Tricuspid valve regurgitation is trivial. Aortic Valve: The aortic valve is normal in structure. Aortic valve regurgitation is not visualized. No aortic stenosis is present. Pulmonic Valve: The pulmonic valve was normal in structure. Pulmonic valve regurgitation is not visualized. Aorta: The aortic root and ascending aorta are structurally normal, with no evidence of dilitation. IAS/Shunts: The atrial septum is grossly normal.  LEFT VENTRICLE PLAX 2D LVIDd:         4.50 cm  Diastology LVIDs:  3.50 cm  LV e' medial:    6.96 cm/s LV PW:         1.50 cm  LV E/e' medial:  12.3 LV IVS:        1.80 cm  LV e' lateral:   9.90 cm/s LVOT diam:     2.30 cm  LV E/e' lateral: 8.6 LV SV:         68 LV SV Index:   34 LVOT Area:     4.15 cm  RIGHT VENTRICLE RV Basal diam:  2.50 cm RV S prime:     14.40 cm/s TAPSE (M-mode): 2.7 cm LEFT ATRIUM             Index       RIGHT ATRIUM           Index LA diam:        3.50 cm 1.75 cm/m  RA Area:     14.00 cm LA Vol (A2C):   49.1 ml 24.60 ml/m RA Volume:   31.50 ml  15.78 ml/m LA Vol (A4C):   43.4 ml 21.74 ml/m LA Biplane Vol: 49.1 ml 24.60 ml/m  AORTIC VALVE LVOT Vmax:   80.50 cm/s LVOT Vmean:  53.500 cm/s LVOT VTI:    0.163 m  AORTA Ao Root diam: 3.60 cm MITRAL VALVE               TRICUSPID VALVE MV Area (PHT): 3.72 cm    TR Peak grad:   17.1 mmHg MV Decel Time: 204 msec    TR Vmax:        207.00 cm/s MV E velocity: 85.40 cm/s MV A velocity: 50.90 cm/s  SHUNTS MV E/A ratio:  1.68        Systemic VTI:  0.16 m                            Systemic Diam: 2.30 cm Kristeen MissPhilip Nahser MD Electronically signed by Kristeen MissPhilip Nahser MD Signature Date/Time: 08/14/2020/10:26:34 AM    Final    US EKG SITE RITE  Result Date: 08/14/2020 If Site Rite image not attached, placement could not be  confirmed due to current cardiac rhythm.       Scheduled Meds: . Chlorhexidine Gluconate Cloth  6 each Topical Q0600  . insulin aspart  0-15 Units Subcutaneous TID WC  . insulin aspart  5 Units Subcutaneous TID WC  . insulin glargine  15 Units Subcutaneous QHS  . levothyroxine  25 mcg Oral Q0600  . linagliptin  5 mg Oral Daily  . metFORMIN  1,000 mg Oral BID WC  . metoprolol succinate  100 mg Oral Daily  . mupirocin ointment  1 application Nasal BID  . polyethylene glycol  17 g Oral BID  . potassium chloride  40 mEq Oral Q3H  . sodium chloride flush  3 mL Intravenous Q12H   Continuous Infusions: . sodium chloride Stopped (08/12/20 0822)  . ertapenem Stopped (08/14/20 1916)     LOS: 6 days    Time spent: 36 minutes spent on chart review, discussion with nursing staff, consultants, updating family and interview/physical exam; more than 50% of that time was spent in counseling and/or coordination of care.    Alvira PhilipsEric J [place], DO [facility] Available via Epic secure chat 7am-7pm After these hours, please refer to coverage provider listed on [email or web address] 08/15/2020, 2:18 PM

## 2020-08-15 NOTE — Progress Notes (Signed)
Peripherally Inserted Central Catheter Placement  The IV Nurse has discussed with the patient and/or persons authorized to consent for the patient, the purpose of this procedure and the potential benefits and risks involved with this procedure.  The benefits include less needle sticks, lab draws from the catheter, and the patient may be discharged home with the catheter. Risks include, but not limited to, infection, bleeding, blood clot (thrombus formation), and puncture of an artery; nerve damage and irregular heartbeat and possibility to perform a PICC exchange if needed/ordered by physician.  Alternatives to this procedure were also discussed.  Bard Power PICC patient education guide, fact sheet on infection prevention and patient information card has been provided to patient /or left at bedside.    PICC Placement Documentation  PICC Single Lumen 08/15/20 PICC Right Basilic 40 cm 0 cm (Active)  Exposed Catheter (cm) 0 cm 08/15/20 1508  Site Assessment Clean;Dry;Intact 08/15/20 1508  Line Status Blood return noted;Flushed;Saline locked 08/15/20 1508  Dressing Type Transparent;Securing device 08/15/20 1508  Dressing Status Clean;Dry;Intact;Antimicrobial disc in place 08/15/20 1508  Dressing Change Due 08/22/20 08/15/20 1508       Romie Jumper 08/15/2020, 3:14 PM

## 2020-08-15 NOTE — Progress Notes (Signed)
PHARMACY CONSULT NOTE FOR:  OUTPATIENT  PARENTERAL ANTIBIOTIC THERAPY (OPAT)  Indication: Diabetic foot infection  Regimen: Ertapenem 1g Q24 hr End date: 09/10/20   IV antibiotic discharge orders are pended. To discharging provider:  please sign these orders via discharge navigator,  Select New Orders & click on the button choice - Manage This Unsigned Work.     Thank you for allowing pharmacy to be a part of this patient's care.  Alphia Moh, PharmD, BCPS, BCCP Clinical Pharmacist  Please check AMION for all [facility] Pharmacy phone numbers After 10:00 PM, call Main Pharmacy [phone]

## 2020-08-15 NOTE — Plan of Care (Signed)
°  Problem: Education: Goal: Knowledge of General Education information will improve Description: Including pain rating scale, medication(s)/side effects and non-pharmacologic comfort measures 08/15/2020 0058 by Maxwell Marion, RN Outcome: Progressing 08/15/2020 0037 by Maxwell Marion, RN Outcome: Progressing   Problem: Health Behavior/Discharge Planning: Goal: Ability to manage health-related needs will improve 08/15/2020 0058 by Maxwell Marion, RN Outcome: Progressing 08/15/2020 0037 by Maxwell Marion, RN Outcome: Progressing   Problem: Clinical Measurements: Goal: Ability to maintain clinical measurements within normal limits will improve 08/15/2020 0058 by Maxwell Marion, RN Outcome: Progressing 08/15/2020 0037 by Maxwell Marion, RN Outcome: Progressing Goal: Will remain free from infection 08/15/2020 0058 by Maxwell Marion, RN Outcome: Progressing 08/15/2020 0037 by Maxwell Marion, RN Outcome: Progressing Goal: Diagnostic test results will improve 08/15/2020 0058 by Maxwell Marion, RN Outcome: Progressing 08/15/2020 0037 by Maxwell Marion, RN Outcome: Progressing Goal: Respiratory complications will improve 08/15/2020 0058 by Maxwell Marion, RN Outcome: Progressing 08/15/2020 0037 by Maxwell Marion, RN Outcome: Progressing Goal: Cardiovascular complication will be avoided 08/15/2020 0058 by Maxwell Marion, RN Outcome: Progressing 08/15/2020 0037 by Maxwell Marion, RN Outcome: Progressing

## 2020-08-15 NOTE — Progress Notes (Signed)
Benefits check in process: Eliquis 5mg  twice a day and Xaralto 20mg  daily.  RN,BSN,CM

## 2020-08-15 NOTE — Progress Notes (Signed)
Patient ID: Dean Rasmussen, male   DOB: 11-10-1951, 69 y.o.   MRN: [id number]         [facility] for Infectious Disease  Date of Admission:  08/08/2020    Total days of antibiotics 7        Day 2 ertapenem         ASSESSMENT: He had a deep polymicrobial abscess of his right foot.  Cultures have grown MSSA, strep anginosis and Bacteroides.  I recommend continuing ertapenem at least until I reevaluate him in clinic on 09/10/2020.  PLAN: 1. Continue ertapenem 2. I will sign off now  Diagnosis: Diabetic foot infection  Culture Result: MSSA, strep and Bacteroides  No Known Allergies  OPAT Orders Discharge antibiotics to be given via PICC line Discharge antibiotics: Per pharmacy protocol ertapenem  Duration: 4 weeks End Date: 09/10/2020  [facility] Care Per Protocol:  Home health RN for IV administration and teaching; PICC line care and labs.    Labs weekly while on IV antibiotics: _x_ CBC with differential _x_ BMP __ CMP _x_ CRP _x_ ESR __ Vancomycin trough __ CK  __ Please pull PIC at completion of IV antibiotics _x_ Please leave PIC in place until doctor has seen patient or been notified  Fax weekly labs to [phone]  Clinic Follow Up Appt: 09/10/2020  Principal Problem:   Abscess of right foot Active Problems:   Open wound of right foot with complication   Essential hypertension   Hypertensive left ventricular hypertrophy, without heart failure   Type 2 diabetes mellitus without complication, without long-term current use of insulin (HCC)   Ventricular ectopy   Diabetic neuropathy (HCC)   Charcot's arthropathy   Normocytic anemia   Atrial fibrillation (HCC)   Scheduled Meds: . Chlorhexidine Gluconate Cloth  6 each Topical Q0600  . insulin aspart  0-15 Units Subcutaneous TID WC  . insulin aspart  5 Units Subcutaneous TID WC  . insulin glargine  15 Units Subcutaneous QHS  . levothyroxine  25 mcg Oral Q0600  . linagliptin  5 mg Oral Daily  .  metFORMIN  1,000 mg Oral BID WC  . metoprolol succinate  100 mg Oral Daily  . mupirocin ointment  1 application Nasal BID  . polyethylene glycol  17 g Oral BID  . potassium chloride  40 mEq Oral Q3H  . sodium chloride flush  3 mL Intravenous Q12H   Continuous Infusions: . sodium chloride Stopped (08/12/20 0822)  . ertapenem Stopped (08/14/20 1916)   PRN Meds:.sodium chloride, acetaminophen **OR** acetaminophen, bisacodyl, HYDROcodone-acetaminophen, sodium chloride flush   SUBJECTIVE: He is waiting on his wife to arrive.  He says that she wants more information about his A. fib and coagulation.  He is scheduled for PICC placement today.  He is tolerating ertapenem..  Review of Systems: Review of Systems  Constitutional: Negative for fever.  Gastrointestinal: Negative for abdominal pain, diarrhea, nausea and vomiting.    No Known Allergies  OBJECTIVE: Vitals:   08/14/20 1614 08/14/20 1916 08/15/20 0409 08/15/20 0742  BP: 137/85 122/76 118/77 118/72  Pulse: 80 74 70 75  Resp: $Remo'20 18 16 18  'dIHcQ$ Temp: 99.2 F (37.3 C) 99.1 F (37.3 C) 98.7 F (37.1 C) 97.7 F (36.5 C)  TempSrc: Oral Oral Oral Oral  SpO2: 100% 99% 99% 98%  Weight:      Height:       Body mass index is 25.83 kg/m.  Physical Exam Constitutional:      General: He is  not in acute distress.    Appearance: Normal appearance.  Musculoskeletal:     Comments: His right foot is dressed.  Psychiatric:        Mood and Affect: Mood normal.     Lab Results Lab Results  Component Value Date   WBC 8.2 08/15/2020   HGB 10.6 (L) 08/15/2020   HCT 32.6 (L) 08/15/2020   MCV 93.4 08/15/2020   PLT 228 08/15/2020    Lab Results  Component Value Date   CREATININE 1.11 08/15/2020   BUN 17 08/15/2020   NA 137 08/15/2020   K 3.2 (L) 08/15/2020   CL 99 08/15/2020   CO2 29 08/15/2020    Lab Results  Component Value Date   ALT 16 08/10/2020   AST 12 (L) 08/10/2020   ALKPHOS 53 08/10/2020   BILITOT 1.0 08/10/2020       Microbiology: Recent Results (from the past 240 hour(s))  WOUND CULTURE     Status: Abnormal   Collection Time: 08/07/20 10:31 AM   Specimen: Foot, Right; Wound  Result Value Ref Range Status   MICRO NUMBER: [id number]  Final   SPECIMEN QUALITY: Adequate  Final   SOURCE: WOUND (SITE NOT SPECIFIED)  Final   STATUS: FINAL  Final   GRAM STAIN:   Final    No white blood cells seen No epithelial cells seen Moderate Gram negative bacilli   RESULT:   Final    Additional organisms of questionable significance were isolated that normally do not warrant identification and susceptibilities. Please contact the laboratory within three days if identification and susceptibilities are clinically indicated.   ISOLATE 1: Staphylococcus aureus (A)  Final    Comment: Heavy growth of Staphylococcus aureus This isolate demonstrates inducible clindamycin resistance.      Susceptibility   Staphylococcus aureus - AEROBIC CULT, GRAM STAIN POSITIVE 1    VANCOMYCIN <=0.5 Sensitive     CIPROFLOXACIN <=0.5 Sensitive     CLINDAMYCIN <=0.25 Resistant     LEVOFLOXACIN 0.25 Sensitive     ERYTHROMYCIN >=8 Resistant     GENTAMICIN <=0.5 Sensitive     OXACILLIN 0.5 Sensitive     TETRACYCLINE >=16 Resistant     TRIMETH/SULFA* <=10 Sensitive      * Legend:S = Susceptible  I = IntermediateR = Resistant  NS = Not susceptible* = Not tested  NR = Not reported**NN = See antimicrobic comments  Culture, blood (Routine x 2)     Status: None   Collection Time: 08/08/20  8:50 PM   Specimen: BLOOD RIGHT ARM  Result Value Ref Range Status   Specimen Description BLOOD RIGHT ARM  Final   Special Requests   Final    BOTTLES DRAWN AEROBIC AND ANAEROBIC Blood Culture adequate volume   Culture   Final    NO GROWTH 5 DAYS Performed at [facility], [address], [place], [place] [address]    Report Status 08/13/2020 FINAL  Final  Culture, blood (Routine x 2)     Status: None   Collection Time: 08/08/20  8:56 PM    Specimen: BLOOD RIGHT HAND  Result Value Ref Range Status   Specimen Description BLOOD RIGHT HAND  Final   Special Requests   Final    BOTTLES DRAWN AEROBIC ONLY Blood Culture results may not be optimal due to an inadequate volume of blood received in culture bottles   Culture   Final    NO GROWTH 5 DAYS Performed at [facility],  [address], [place], [place] [address]    Report Status 08/13/2020 FINAL  Final  SARS Coronavirus 2 by RT PCR (hospital order, performed in [facility] hospital lab) Nasopharyngeal Nasopharyngeal Swab     Status: None   Collection Time: 08/09/20 11:56 AM   Specimen: Nasopharyngeal Swab  Result Value Ref Range Status   SARS Coronavirus 2 NEGATIVE NEGATIVE Final    Comment: (NOTE) SARS-CoV-2 target nucleic acids are NOT DETECTED.  The SARS-CoV-2 RNA is generally detectable in upper and lower respiratory specimens during the acute phase of infection. The lowest concentration of SARS-CoV-2 viral copies this assay can detect is 250 copies / mL. A negative result does not preclude SARS-CoV-2 infection and should not be used as the sole basis for treatment or other patient management decisions.  A negative result may occur with improper specimen collection / handling, submission of specimen other than nasopharyngeal swab, presence of viral mutation(s) within the areas targeted by this assay, and inadequate number of viral copies (<250 copies / mL). A negative result must be combined with clinical observations, patient history, and epidemiological information.  Fact Sheet for Patients:   StrictlyIdeas.no  Fact Sheet for Healthcare Providers: BankingDealers.co.za  This test is not yet approved or  cleared by the [place] FDA and has been authorized for detection and/or diagnosis of SARS-CoV-2 by FDA under an Emergency Use Authorization (EUA).  This EUA will remain in effect (meaning this test can be  used) for the duration of the COVID-19 declaration under Section 564(b)(1) of the Act, 21 U.S.C. section 360bbb-3(b)(1), unless the authorization is terminated or revoked sooner.  Performed at [facility], [place] [address], [place], [place] [address]   Aerobic/Anaerobic Culture (surgical/deep wound)     Status: None   Collection Time: 08/09/20  6:27 PM   Specimen: PATH Other; Tissue  Result Value Ref Range Status   Specimen Description ABSCESS RIGHT FOOT  Final   Special Requests SPEC A  Final   Gram Stain   Final    FEW WBC PRESENT, PREDOMINANTLY PMN ABUNDANT GRAM POSITIVE COCCI ABUNDANT GRAM NEGATIVE RODS    Culture   Final    ABUNDANT STREPTOCOCCUS ANGINOSIS MODERATE STAPHYLOCOCCUS AUREUS ABUNDANT BACTEROIDES FRAGILIS BETA LACTAMASE POSITIVE Performed at [facility], [place] [address] [place], [place] [address]    Report Status 08/14/2020 FINAL  Final   Organism ID, Bacteria STREPTOCOCCUS ANGINOSIS  Final   Organism ID, Bacteria STAPHYLOCOCCUS AUREUS  Final      Susceptibility   Staphylococcus aureus - MIC*    CIPROFLOXACIN <=0.5 SENSITIVE Sensitive     ERYTHROMYCIN >=8 RESISTANT Resistant     GENTAMICIN <=0.5 SENSITIVE Sensitive     OXACILLIN <=0.25 SENSITIVE Sensitive     TETRACYCLINE >=16 RESISTANT Resistant     VANCOMYCIN <=0.5 SENSITIVE Sensitive     TRIMETH/SULFA <=10 SENSITIVE Sensitive     CLINDAMYCIN RESISTANT Resistant     RIFAMPIN <=0.5 SENSITIVE Sensitive     Inducible Clindamycin POSITIVE Resistant     * MODERATE STAPHYLOCOCCUS AUREUS   Streptococcus anginosis - MIC*    PENICILLIN <=0.06 SENSITIVE Sensitive     CEFTRIAXONE 0.25 SENSITIVE Sensitive     ERYTHROMYCIN 2 RESISTANT Resistant     LEVOFLOXACIN 0.5 SENSITIVE Sensitive     VANCOMYCIN 0.5 SENSITIVE Sensitive     * ABUNDANT STREPTOCOCCUS ANGINOSIS  Aerobic/Anaerobic Culture (surgical/deep wound)     Status: None   Collection Time: 08/09/20  6:34 PM   Specimen: PATH  Other; Tissue    Result Value Ref Range Status   Specimen Description TISSUE  Final   Special Requests FIRST METATARSAL BONE CULTURE SPEC B  Final   Gram Stain NO WBC SEEN FEW GRAM POSITIVE COCCI   Final   Culture   Final    FEW STAPHYLOCOCCUS AUREUS MODERATE BACTEROIDES FRAGILIS BETA LACTAMASE POSITIVE Performed at [facility], [place] [address] [place], [place] [address]    Report Status 08/15/2020 FINAL  Final   Organism ID, Bacteria STAPHYLOCOCCUS AUREUS  Final      Susceptibility   Staphylococcus aureus - MIC*    CIPROFLOXACIN <=0.5 SENSITIVE Sensitive     ERYTHROMYCIN >=8 RESISTANT Resistant     GENTAMICIN <=0.5 SENSITIVE Sensitive     OXACILLIN <=0.25 SENSITIVE Sensitive     TETRACYCLINE >=16 RESISTANT Resistant     VANCOMYCIN <=0.5 SENSITIVE Sensitive     TRIMETH/SULFA <=10 SENSITIVE Sensitive     CLINDAMYCIN RESISTANT Resistant     RIFAMPIN <=0.5 SENSITIVE Sensitive     Inducible Clindamycin POSITIVE Resistant     * FEW STAPHYLOCOCCUS AUREUS  Surgical pcr screen     Status: Abnormal   Collection Time: 08/12/20  2:20 AM   Specimen: Nasal Mucosa; Nasal Swab  Result Value Ref Range Status   MRSA, PCR NEGATIVE NEGATIVE Final   Staphylococcus aureus POSITIVE (A) NEGATIVE Final    Comment: (NOTE) The Xpert SA Assay (FDA approved for NASAL specimens in patients 35 years of age and older), is one component of a comprehensive surveillance program. It is not intended to diagnose infection nor to guide or monitor treatment. Performed at [facility], [place] [address], [place], [place] [address]   Aerobic/Anaerobic Culture (surgical/deep wound)     Status: None (Preliminary result)   Collection Time: 08/12/20 10:54 AM   Specimen: Wound; Abscess  Result Value Ref Range Status   Specimen Description WOUND RIGHT FOOT  Final   Special Requests SAMPLE A PREWASH  Final   Gram Stain NO WBC SEEN RARE GRAM POSITIVE COCCI IN PAIRS   Final   Culture   Final    CULTURE REINCUBATED FOR  BETTER GROWTH Performed at [facility], [address] [place], [place] [address]    Report Status PENDING  Incomplete  Aerobic/Anaerobic Culture (surgical/deep wound)     Status: None (Preliminary result)   Collection Time: 08/12/20 10:55 AM   Specimen: Wound; Abscess  Result Value Ref Range Status   Specimen Description WOUND RIGHT FOOT  Final   Special Requests SAMPLE B POST [place]  Final   Gram Stain NO WBC SEEN NO ORGANISMS SEEN   Final   Culture   Final    NO GROWTH 3 DAYS Performed at [facility], [address], [place], [place] [address]    Report Status PENDING  Incomplete    Michel Bickers, MD [facility] for Infectious [place] [facility] [phone] pager   [phone] cell 08/15/2020, 12:18 PM

## 2020-08-16 LAB — CBC
HCT: 30.3 % — ABNORMAL LOW (ref 39.0–52.0)
Hemoglobin: 9.8 g/dL — ABNORMAL LOW (ref 13.0–17.0)
MCH: 30.2 pg (ref 26.0–34.0)
MCHC: 32.3 g/dL (ref 30.0–36.0)
MCV: 93.5 fL (ref 80.0–100.0)
Platelets: 214 10*3/uL (ref 150–400)
RBC: 3.24 MIL/uL — ABNORMAL LOW (ref 4.22–5.81)
RDW: 12.4 % (ref 11.5–15.5)
WBC: 7 10*3/uL (ref 4.0–10.5)
nRBC: 0 % (ref 0.0–0.2)

## 2020-08-16 LAB — GLUCOSE, CAPILLARY
Glucose-Capillary: 111 mg/dL — ABNORMAL HIGH (ref 70–99)
Glucose-Capillary: 112 mg/dL — ABNORMAL HIGH (ref 70–99)
Glucose-Capillary: 251 mg/dL — ABNORMAL HIGH (ref 70–99)
Glucose-Capillary: 125 mg/dL — ABNORMAL HIGH (ref 70–99)

## 2020-08-16 LAB — BASIC METABOLIC PANEL
Anion gap: 6 (ref 5–15)
BUN: 15 mg/dL (ref 8–23)
CO2: 28 mmol/L (ref 22–32)
Calcium: 8.3 mg/dL — ABNORMAL LOW (ref 8.9–10.3)
Chloride: 103 mmol/L (ref 98–111)
Creatinine, Ser: 1.08 mg/dL (ref 0.61–1.24)
GFR calc Af Amer: >60 mL/min (ref >60)
GFR calc non Af Amer: >60 mL/min (ref >60)
Glucose, Bld: 130 mg/dL — ABNORMAL HIGH (ref 70–99)
Potassium: 4.1 mmol/L (ref 3.5–5.1)
Sodium: 137 mmol/L (ref 135–145)

## 2020-08-16 LAB — MAGNESIUM: Magnesium: 1.8 mg/dL (ref 1.7–2.4)

## 2020-08-16 MED ORDER — INSULIN LISPRO (1 UNIT DIAL) 100 UNIT/ML (KWIKPEN): 2.0000 [IU] | PEN_INJECTOR | Freq: Three times a day (TID) | SUBCUTANEOUS | 0 refills | Status: DC

## 2020-08-16 MED ORDER — ERTAPENEM IV (FOR PTA / DISCHARGE USE ONLY): 1.0000 g | INTRAVENOUS | 0 refills | Status: DC

## 2020-08-16 MED ORDER — METOPROLOL SUCCINATE ER 100 MG PO TB24: 100.0000 mg | ORAL_TABLET | Freq: Every day | ORAL | 0 refills | Status: DC

## 2020-08-16 MED ORDER — METFORMIN HCL 1000 MG PO TABS: 2000.0000 mg | ORAL_TABLET | Freq: Every day | ORAL | 0 refills | Status: AC

## 2020-08-16 MED ORDER — LEVOTHYROXINE SODIUM 25 MCG PO TABS: 25.0000 ug | ORAL_TABLET | Freq: Every day | ORAL | 0 refills | Status: DC

## 2020-08-16 MED ORDER — RIVAROXABAN 20 MG PO TABS: 20.0000 mg | ORAL_TABLET | Freq: Every day | ORAL | 0 refills | Status: DC

## 2020-08-16 MED ORDER — GLIMEPIRIDE 4 MG PO TABS: 4.0000 mg | ORAL_TABLET | Freq: Every day | ORAL | 0 refills | Status: AC

## 2020-08-16 MED ORDER — SITAGLIPTIN PHOSPHATE 100 MG PO TABS: 100.0000 mg | ORAL_TABLET | Freq: Every day | ORAL | 0 refills | Status: DC

## 2020-08-16 MED ORDER — HYDROCODONE-ACETAMINOPHEN 5-325 MG PO TABS: 1.0000 | ORAL_TABLET | ORAL | 0 refills | Status: DC | PRN

## 2020-08-16 MED ORDER — METFORMIN HCL 1000 MG PO TABS: 2000.0000 mg | ORAL_TABLET | Freq: Every day | ORAL | 0 refills | Status: DC

## 2020-08-16 MED ORDER — "PEN NEEDLES 3/16"" 31G X 5 MM MISC": 0 refills | Status: DC

## 2020-08-16 MED ORDER — SODIUM CHLORIDE 0.9 % IV SOLN: 1.0000 g | INTRAVENOUS | Status: DC

## 2020-08-16 MED ORDER — RIVAROXABAN 20 MG PO TABS
20.0000 mg | ORAL_TABLET | Freq: Every day | ORAL | Status: DC
  Administered 2020-08-16: 20 mg via ORAL
  Filled 2020-08-16: qty 1

## 2020-08-16 MED ORDER — HEPARIN SOD (PORK) LOCK FLUSH 100 UNIT/ML IV SOLN
250.0000 [IU] | INTRAVENOUS | Status: AC | PRN
  Administered 2020-08-16: 250 [IU]
  Filled 2020-08-16: qty 2.5

## 2020-08-16 MED ORDER — GLIMEPIRIDE 4 MG PO TABS: 4.0000 mg | ORAL_TABLET | Freq: Every day | ORAL | 0 refills | Status: DC

## 2020-08-16 MED ORDER — LEVOTHYROXINE SODIUM 25 MCG PO TABS: 25.0000 ug | ORAL_TABLET | Freq: Every day | ORAL | 0 refills | Status: AC

## 2020-08-16 MED FILL — XARELTO 20 MG TABLET: 20 | 30 days supply | Qty: 30 | Fill #0

## 2020-08-16 NOTE — Progress Notes (Signed)
ANTICOAGULATION CONSULT NOTE - Initial Consult  Pharmacy Consult for Rivaroxaban Indication: atrial fibrillation  No Known Allergies  Patient Measurements: Height: 5\' 10"  (177.8 cm) Weight: 81.6 kg (180 lb) IBW/kg (Calculated) : 73  Vital Signs: Temp: 97.9 F (36.6 C) (09/17 0743) Temp Source: Oral (09/17 0743) BP: 166/95 (09/17 0743) Pulse Rate: 78 (09/17 0743)  Labs: Recent Labs    08/13/20 1254 08/13/20 1254 08/14/20 0441 08/15/20 0728 08/16/20 0341  HGB 11.1*   < >  --  10.6* 9.8*  HCT 34.0*  --   --  32.6* 30.3*  PLT 207  --   --  228 214  CREATININE  --   --  1.21 1.11 1.08   < > = values in this interval not displayed.    Estimated Creatinine Clearance: 66.7 mL/min (by C-G formula based on SCr of 1.08 mg/dL).   Assessment: 69 yo M with new onset afib. First episode was transient intraoperatively and patient was started on Apixaban, which was stopped due to patient concern that he was off metoprolol at the time. Patient went back into Afib overnight 9/16. Pharmacy is consulted for rivaroxaban, copay check was done by SW. No AC PTA. H/H low stable, plt stable.   Goal of Therapy:  Monitor platelets by anticoagulation protocol: Yes   Plan:  Start rivaroxaban 20mg  daily with meal Monitor for signs/symptoms of bleeding   10/16, PharmD, BCPS, BCCP Clinical Pharmacist  Please check AMION for all [facility] Pharmacy phone numbers After 10:00 PM, call Main Pharmacy [phone]

## 2020-08-16 NOTE — Progress Notes (Signed)
Physical Therapy Treatment Patient Details Name: Dean Rasmussen MRN: [id number] DOB: [birth year] Today's Date: 08/16/2020    History of Present Illness 69 year old male with past medical history for uncontrolled type 2 diabetes mellitus and hypertension.  Patient reported about 3-week history of erythema and edema at the right midfoot, rapidly progressed and worsening.  Outpatient evaluation with an MRI showed large abscess with possible osteomyelitis. s/p surgical debridement 08/12/20.    PT Comments    Patient received in bed, pleasant and cooperative with session today. Continued practicing mounting/dismounting knee scooter which he was able to do much more safely today, although he did still require occasional cues to maintain NWB R LE while adjusting in bed and at one point for transfer over to scooter. Also tolerated progression of distance navigated with knee scooter, and demonstrated better balance on scooter overall to boot. Discussed technique and how family can assist for getting scooter up the "ramp/slope" that he will need to navigate to get up to the sidewalk to his apartment as well. Left in bed with all needs met this afternoon.   Follow Up Recommendations  Home health PT;Supervision for mobility/OOB     Equipment Recommendations  Rolling walker with 5" wheels;Other (comment) (knee scooter)    Recommendations for Other Services       Precautions / Restrictions Precautions Precautions: Fall Precaution Comments: low fall Restrictions Weight Bearing Restrictions: Yes RLE Weight Bearing: Non weight bearing    Mobility  Bed Mobility Overal bed mobility: Needs Assistance Bed Mobility: Supine to Sit;Sit to Supine     Supine to sit: Modified independent (Device/Increase time);HOB elevated Sit to supine: Modified independent (Device/Increase time);HOB elevated   General bed mobility comments: no physical assist given, but did need occasional cue for NWB R foot when  adjusting in bed  Transfers Overall transfer level: Needs assistance Equipment used:  (knee scooter) Transfers: Stand Pivot Transfers;Sit to/from Stand Sit to Stand: Min guard Stand pivot transfers: Min guard       General transfer comment: min guard for steadying knee scooter and ensuring safety, improving stability and turning with scooter  Ambulation/Gait             General Gait Details: able to propel knee scooter approximately 162ft with min guard, much better balance and safety riding scooter today   Stairs             Wheelchair Mobility    Modified Rankin (Stroke Patients Only)       Balance Overall balance assessment: Needs assistance Sitting-balance support: No upper extremity supported;Feet supported Sitting balance-Leahy Scale: Good     Standing balance support: During functional activity;Single extremity supported Standing balance-Leahy Scale: Fair Standing balance comment: reliant on at least one UE support during dynamic tasks                            Cognition Arousal/Alertness: Awake/alert Behavior During Therapy: WFL for tasks assessed/performed Overall Cognitive Status: Within Functional Limits for tasks assessed                                        Exercises      General Comments        Pertinent Vitals/Pain Pain Assessment: No/denies pain Pain Score: 0-No pain Pain Intervention(s): Limited activity within patient's tolerance;Monitored during session    Home Living  Prior Function            PT Goals (current goals can now be found in the care plan section) Acute Rehab PT Goals Patient Stated Goal: go home safely and with needed DME PT Goal Formulation: With patient Time For Goal Achievement: 08/27/20 Potential to Achieve Goals: Good Additional Goals Additional Goal #1: Patient will demonstrate ability to navigate at least 50ft in knee scooter with only  general S Progress towards PT goals: Progressing toward goals    Frequency    Min 3X/week      PT Plan Current plan remains appropriate    Co-evaluation              AM-PAC PT "6 Clicks" Mobility   Outcome Measure  Help needed turning from your back to your side while in a flat bed without using bedrails?: None Help needed moving from lying on your back to sitting on the side of a flat bed without using bedrails?: None Help needed moving to and from a bed to a chair (including a wheelchair)?: A Little Help needed standing up from a chair using your arms (e.g., wheelchair or bedside chair)?: A Little Help needed to walk in hospital room?: A Little Help needed climbing 3-5 steps with a railing? : A Lot 6 Click Score: 19    End of Session Equipment Utilized During Treatment: Gait belt Activity Tolerance: Patient tolerated treatment well Patient left: in bed;with call bell/phone within reach   PT Visit Diagnosis: Unsteadiness on feet (R26.81);Difficulty in walking, not elsewhere classified (R26.2);Pain;Muscle weakness (generalized) (M62.81) Pain - Right/Left: Right Pain - part of body: Ankle and joints of foot     Time: 4818-5631 PT Time Calculation (min) (ACUTE ONLY): 18 min  Charges:  $Therapeutic Activity: 8-22 mins                     Windell Norfolk, DPT, PN1   Supplemental Physical Therapist [place]    Pager [phone] Acute Rehab Office [phone]

## 2020-08-16 NOTE — TOC Transition Note (Signed)
Transition of Care [facility]) - CM/SW Discharge Note   Patient Details  Name: Dean Rasmussen MRN: [id number] Date of Birth: [birth year]  Transition of Care [facility] CM/SW Contact:  Epifanio Lesches, RN Phone Number: 08/16/2020, 4:20 PM   Clinical Narrative:    Patient will DC to: home  Anticipated DC date: 08/16/2020 Family notified: wife Transport by: car  Per MD patient ready for DC today. Pt will transition to home with outpatient (PT/OT) services to follow. NCM was unable to land [facility] agency. RN, patient, and patient's wife notified of DC. Pt given Xaralto copay card for refills. SOC for IV ABX therapy, 08/17/2020 @ 9am per Pam with [facility]. Pt without PCP...  will f/u up at [facility] or wife to get in touch with insurance to help navigate primary provided. DME will be provided to pt prior to d/c.  RNCM will sign off for now as intervention is no longer needed. Please consult [place] again if new needs arise.   Final next level of care: Home w Home Health Services Barriers to Discharge: No Barriers Identified   Patient Goals and CMS Choice     Choice offered to / list presented to : Patient  Discharge Placement                       Discharge Plan and Services                DME Arranged: Other see comment (IV ABX therapy) DME Agency: Other - Comment ([facility]) Date DME Agency Contacted: 08/15/20 Time DME Agency Contacted: 1600 Representative spoke with at DME Agency: Pam HH Arranged: RN   Date HH Agency Contacted: 08/15/20 Time HH Agency Contacted: 1600 Representative spoke with at [facility] Agency: Pam  Social Determinants of Health (SDOH) Interventions     Readmission Risk Interventions No flowsheet data found.

## 2020-08-16 NOTE — Progress Notes (Signed)
Discharge paperwork and instructions given to pt. Pt not in distress and tolerated well. 

## 2020-08-16 NOTE — Plan of Care (Signed)
  Problem: Safety: Goal: Ability to remain free from injury will improve Outcome: Progressing   

## 2020-08-16 NOTE — Progress Notes (Signed)
Subjective: POD #3 s/p wound debridement, removal of antibiotic beads, delayed primary closure with application of skin substitute.  Overall feeling well.  Some discomfort still at nighttime but currently denies any fevers or chills.  He has no other concerns today.  Objective: AAO x3, NAD DP/PT pulses palpable bilaterally, CRT less than 3 seconds RLE: Bandage clean, dry, intact without any strike through.   LLE without any ulceration  No pain with calf compression, swelling, warmth, erythema  Assessment: POD #3 s/p return to the OR for wound debridement, closure, graft application  Plan: At this point continue with IV antibiotics and PICC line was placed today.  We will continue antibiotics per infectious disease.  Continue nonweightbearing, elevation.  We will plan to change the bandage tomorrow.  Hopeful discharge on Friday.  I discussed the case with the patient's wife today in-person.   Ovid Curd, DPM

## 2020-08-16 NOTE — Discharge Summary (Addendum)
Physician Discharge Summary  Dean Rasmussen QQV:[id number] DOB: 01-Jun-1951 DOA: 08/08/2020  PCP: Patient, [name]  Admit date: 08/08/2020 Discharge date: 08/16/2020  Admitted From: Home Disposition: Home  Recommendations for Outpatient Follow-up:  1. Follow up with PCP, appointment made at [facility] with Cammie Fulp on 09/04/2020 at 230pm 2. Follow-up with infectious disease, Dr. Megan Salon on 09/10/2020 3. Outpatient follow-up with podiatry as scheduled 4. Ambulatory referral placed for cardiology for new onset atrial fibrillation 5. Discharging home on IV antibiotics with ertapenem 1 g IV every 24 hours x4 weeks with PICC line in place, Dr. Megan Salon will evaluate whether to discontinue antibiotics or to continue at next appointment. 6. Please obtain CBC with differential, BMP, CRP, ESR weekly  Home Health: outpatient PT/OT Equipment/Devices: Walker  Discharge Condition: Stable CODE STATUS: Full code Diet recommendation: Heart healthy/consistent carbohydrate diet  History of present illness:  Drury Ardizzone is a 69 year old male with past medical history notable for uncontrolled type 2 diabetes mellitus, hypertension who presented to the ED with progressive 3-week history of erythema, edema and pain to the right midfoot.  Recent outpatient evaluation with MRI showed large abscess with possible osteomyelitis.  In the ED, BP 137/81, HR 94, RR 22, temperature 98.4, SPO2 93% on room air.  Sodium 136, potassium 4.0, BUN 32, creatinine 1.12, WBC count 21.4, hemoglobin 12.0, platelets 240.  SARS-CoV-2 negative.  Podiatry was consulted, [facility] consulted for admission.  Hospital course:  Diabetic foot abscess with suspected osteomyelitis Patient presented with 3-week history of progressive right foot swelling, erythema and pain.  MR right foot with soft tissue ulceration plantar aspect right foot with sinus tract extending plantar aspect first TMT joint, suspect septic arthritis of first TMT joint, signal  changes plantar aspect first metatarsal base and medial cuneiform compatible with acute osteomyelitis, large fluid and air-containing collection medial/dorsal aspect of midfoot measuring up to 6.7 cm consistent with abscess, subchondral marrow changes, and fatty atrophy with diffuse edema-like signal suggesting denervation changes and or myositis.  Podiatry was consulted and underwent irrigation and debridement with antibiotic bead placement by Dr. Sherryle Lis on 08/12/2020 followed by secondary wound closure with antibiotic bead removal on 08/12/2020.  Operative wound culture 9/10 with MSSA, Streptococcus anginosis, bacteroides fragilis.  Bone biopsy with no active osteomyelitis but with MSSA.  Infectious disease been consulted and followed during hospital course.  Antibiotics were changed to ertapenem 1 g IV every 24 hours with plan for week course per ID.  We'll continue nonweightbearing right lower extremity per podiatry with outpatient follow-up in their clinic.  Has follow-up with Dr. Megan Salon on 09/10/2020 to determine further length of antibiotics if needed.  Type 2 diabetes mellitus, uncontrolled Hemoglobin A1c 9.0.  On glimepiride 4 mg p.o. daily, Metformin 2000 mg p.o. daily, and Januvia 100 mg p.o. daily at home.  Patient was started on Lantus and mealtime NovoLog during hospitalization.  Spouse did not want patient receiving long-acting insulin on discharge.  Will resume home Metformin and Amaryl on discharge.  Will give Humalog insulin sliding scale for additional coverage.  Patient has new PCP follow-up on 09/04/2020.  Paroxysmal atrial fibrillation, new onset In the operating room, patient was noted to have episode of atrial fibrillation which has spontaneously resolved.  CHA2DS2-VASc score = 3 for history of hypertension, age, and diabetes history correlating to a 3.2% stroke risk per year. Continue metoprolol succinate 100 g p.o. daily for rate control.  Started on Xarelto per spouse's request on  discharge.  Amatory referral placed to cardiology  for continued follow-up outpatient.  Essential hypertension Continue metoprolol succinate 100 mg p.o. daily  Hypothyroidism: Continue levothyroxine 25 mcg p.o. daily  Discharge Diagnoses:  Principal Problem:   Abscess of right foot Active Problems:   Essential hypertension   Hypertensive left ventricular hypertrophy, without heart failure   Type 2 diabetes mellitus without complication, without long-term current use of insulin (HCC)   Ventricular ectopy   Open wound of right foot with complication   Diabetic neuropathy (HCC)   Charcot's arthropathy   Normocytic anemia   Atrial fibrillation [facility])    Discharge Instructions  Discharge Instructions    [facility] pharmacist to adjust dose for Vancomycin, Aminoglycosides and other anti-infective therapies as requested by physician.   Complete by: As directed    [facility] to provide Cath Flo 2mg    Complete by: As directed    Administer for PICC line occlusion and as ordered by physician for other access device issues.   Ambulatory referral to Cardiology   Complete by: As directed    Ambulatory referral to Occupational Therapy   Complete by: As directed    Ambulatory referral to Physical Therapy   Complete by: As directed    Anaphylaxis Kit: Provided to treat any anaphylactic reaction to the medication being provided to the patient if First Dose or when requested by physician   Complete by: As directed    Epinephrine 1mg /ml vial / amp: Administer 0.3mg  (0.50ml) subcutaneously once for moderate to severe anaphylaxis, nurse to call physician and pharmacy when reaction occurs and call 911 if needed for immediate care   Diphenhydramine 50mg /ml IV vial: Administer 25-50mg  IV/IM PRN for first dose reaction, rash, itching, mild reaction, nurse to call physician and pharmacy when reaction occurs   Sodium Chloride 0.9% NS 590ml IV: Administer if needed for  hypovolemic blood pressure drop or as ordered by physician after call to physician with anaphylactic reaction   Call MD for:  difficulty breathing, headache or visual disturbances   Complete by: As directed    Call MD for:  extreme fatigue   Complete by: As directed    Call MD for:  persistant dizziness or light-headedness   Complete by: As directed    Call MD for:  persistant nausea and vomiting   Complete by: As directed    Call MD for:  severe uncontrolled pain   Complete by: As directed    Call MD for:  temperature >100.4   Complete by: As directed    Change dressing on IV access line weekly and PRN   Complete by: As directed    Diet - low sodium heart healthy   Complete by: As directed    Diet Carb Modified   Complete by: As directed    Discharge wound care:   Complete by: As directed    Maintain dressing in place per podiatry   Flush IV access with Sodium Chloride 0.9% and Heparin 10 units/ml or 100 units/ml   Complete by: As directed    Home infusion instructions - [facility]   Complete by: As directed    Instructions: Flush IV access with Sodium Chloride 0.9% and Heparin 10units/ml or 100units/ml   Change dressing on IV access line: Weekly and PRN   Instructions Cath Flo 2mg : Administer for PICC Line occlusion and as ordered by physician for other access device   [facility] pharmacist to adjust dose for: Vancomycin, Aminoglycosides and other anti-infective therapies as requested by physician  Increase activity slowly   Complete by: As directed    Method of administration may be changed at the discretion of home infusion pharmacist based upon assessment of the patient and/or caregiver's ability to self-administer the medication ordered   Complete by: As directed      Allergies as of 08/16/2020   No Known Allergies     Medication List    STOP taking these medications   dexamethasone 2 MG tablet Commonly known as: DECADRON   Lyumjev KwikPen  100 UNIT/ML Sopn Generic drug: Insulin Lispro-aabc (1 U Dial)     TAKE these medications   acetaminophen 500 MG tablet Commonly known as: TYLENOL Take 1,000 mg by mouth every 6 (six) hours as needed (pain).   aspirin 81 MG EC tablet Take 81 mg by mouth daily with supper.   ertapenem  IVPB Commonly known as: INVANZ Inject 1 g into the vein daily for 26 days. Indication: Diabetic foot infection  First Dose: Yes Last Day of Therapy:  09/10/20  Labs - Once weekly:  CBC/D and BMP, Labs - Every other week:  ESR and CRP Method of administration: Mini-Bag Plus / Gravity Method of administration may be changed at the discretion of home infusion pharmacist based upon assessment of the patient and/or caregiver's ability to self-administer the medication ordered.   ertapenem 1,000 mg in sodium chloride 0.9 % 100 mL Inject 1,000 mg into the vein daily.   glimepiride 4 MG tablet Commonly known as: AMARYL Take 1 tablet (4 mg total) by mouth daily with supper.   HYDROcodone-acetaminophen 5-325 MG tablet Commonly known as: NORCO/VICODIN Take 1-2 tablets by mouth every 4 (four) hours as needed for moderate pain.   ibuprofen 200 MG tablet Commonly known as: ADVIL Take 400 mg by mouth every 6 (six) hours as needed (pain).   insulin lispro 100 UNIT/ML KwikPen Commonly known as: HUMALOG Inject 2-10 Units into the skin 4 (four) times daily -  before meals and at bedtime. 2u BG 151-200, 4u BG 201-250, 6u BG 251-300, 8u BG 301-350, 10u BG 351-400   levothyroxine 25 MCG tablet Commonly known as: Synthroid Take 1 tablet (25 mcg total) by mouth daily before breakfast.   MAGNESIUM PO Take 2 tablets by mouth at bedtime.   metFORMIN 1000 MG tablet Commonly known as: GLUCOPHAGE Take 2 tablets (2,000 mg total) by mouth daily with supper.   metoprolol succinate 100 MG 24 hr tablet Commonly known as: TOPROL-XL Take 1 tablet (100 mg total) by mouth daily.   minocycline 100 MG capsule Commonly  known as: MINOCIN Take 100 mg by mouth See admin instructions. At onset of rosacea flare take one capsule (100 mg) twice daily for a few days, then take one capsule (100 mg) daily for a few days, then take one capsule (100 mg) every other day until clear. Resume when rosacea flares back up.   Pen Needles 3/16" 31G X 5 MM Misc Use as directed with insulin pen   rivaroxaban 20 MG Tabs tablet Commonly known as: XARELTO Take 1 tablet (20 mg total) by mouth daily with supper.   sitaGLIPtin 100 MG tablet Commonly known as: Januvia Take 1 tablet (100 mg total) by mouth daily with supper.            Durable Medical Equipment  (From admission, onward)         Start     Ordered   08/14/20 1528  For home use only DME Tub bench  Once  08/14/20 1536   08/14/20 1524  For home use only DME Walker rolling  Once       Question Answer Comment  Walker: With [place]   Patient needs a walker to treat with the following condition Weakness      08/14/20 1536           Discharge Care Instructions  (From admission, onward)         Start     Ordered   08/16/20 0000  Change dressing on IV access line weekly and PRN  (Home infusion instructions - [facility] )        08/16/20 1106   08/16/20 0000  Discharge wound care:       Comments: Maintain dressing in place per podiatry   08/16/20 1106          [place]. Go on 09/04/2020.   Why: 2:30 pm  with Dr.Cammie Fulp Contact information: [place] [id number] [phone]       Michel Bickers, MD. Go on 09/10/2020.   Specialty: Infectious Diseases Contact information: [address] [place] [address] [phone]        [facility] Follow up.   Specialty: Rehabilitation Why: Referral made for outpatient PT and OT. Office will call and setup appointment  time. Contact information: [place] [id number] [place] [phone]             No Known Allergies  Consultations:  Infectious disease, Dr. Megan Salon  Podiatry, Dr. Sherryle Lis, Dr. Jacqualyn Posey   Procedures/Studies: MR FOOT RIGHT WO CONTRAST  Result Date: 08/09/2020 CLINICAL DATA:  Draining right foot wound.  Diabetes.  Leukocytosis. EXAM: MRI OF THE RIGHT FOREFOOT WITHOUT CONTRAST TECHNIQUE: Multiplanar, multisequence MR imaging of the right forefoot was performed. No intravenous contrast was administered. COMPARISON:  X-ray 08/07/2020 FINDINGS: There is a focal soft tissue ulceration at the plantar aspect of the right foot underlying the level of the first tarsometatarsal joint with sinus track extending into the plantar aspect of the first TMT joint (series 7, images 23-24). There is marked associated soft tissue edema and skin thickening at the plantar medial aspect of the foot. Along the medial and dorsal aspect of the foot, there is a large fluid and air containing collection measuring approximately 6.7 x 1.4 x 3.3 cm (series 8, image 25; series 7, images 21-32). There is bone marrow edema with loss of cortical margin and low T1 marrow signal along the plantar aspect of the first metatarsal base at the level of the previously described soft tissue ulceration (series 3 and 8, images 22). There is also subtle marrow edema within the plantar aspect of the adjacent medial cuneiform. There are advanced degenerative changes throughout the midfoot, particularly involving the first and second TMT joints, naviculocuneiform, and intercuneiform joints with associated subchondral marrow signal changes. Findings may reflect a neuropathic joint. Severe arthropathy at the first MTP joint with complete joint space loss and subchondral cystic changes along both sides of joint. No acute fracture. No dislocation. Fatty atrophy of the intrinsic foot musculature with  diffuse edema-like signal suggesting denervation changes and/or myositis. Grossly intact tendinous structures without tenosynovial fluid collection. IMPRESSION: 1. Focal soft tissue ulceration at the plantar aspect of the right foot underlying the level of the first tarsometatarsal joint with sinus track extending into the plantar aspect of the first  TMT joint. Subtle signal changes within the plantar aspects of the first metatarsal base and medial cuneiform compatible with acute osteomyelitis. 2. Suspect septic arthritis of the first TMT joint. 3. Large fluid and air containing collection along the medial and dorsal aspect of the midfoot measuring up to 6.7 cm, consistent with abscess. 4. Advanced degenerative changes throughout the midfoot with associated subchondral marrow signal changes. Findings may reflect a neuropathic joint. Signal changes at this level related to an infectious arthropathy would be difficult to exclude. 5. Fatty atrophy of the intrinsic foot musculature with diffuse edema-like signal suggesting denervation changes and/or myositis. Electronically Signed   By: Davina Poke D.O.   On: 08/09/2020 10:24   DG CHEST PORT 1 VIEW  Result Date: 08/15/2020 CLINICAL DATA:  PICC line revision EXAM: PORTABLE CHEST 1 VIEW COMPARISON:  None. FINDINGS: Right extreme costophrenic angle is excluded from view. Lungs are clear. No pneumothorax or pleural effusion. Cardiac size is within normal limits. Right upper extremity PICC line tip is seen at the superior cavoatrial junction. Pulmonary vascularity is normal. No acute bone abnormality. IMPRESSION: 1. Right upper extremity PICC line tip at the superior cavoatrial junction. 2. No acute cardiopulmonary process. Electronically Signed   By: Fidela Salisbury MD   On: 08/15/2020 15:50   DG Foot Complete Right  Result Date: 08/09/2020 CLINICAL DATA:  Right foot surgery EXAM: RIGHT FOOT COMPLETE - 3+ VIEW COMPARISON:  08/07/2020 FINDINGS: Decreased soft  tissue swelling at the level of the MTP joint and tarsal bones with interim placement of water presumed to represent antibiotic beads. Irregular arthropathy at the first TMT joint with irregular cortical margin of the medial cuneiform. Advanced degenerative changes of the first MTP joint. IMPRESSION: 1. Presumed placement of antibiotic beads at the level of the first MTP joint and tarsal bones with decreased soft tissue swelling. 2. Irregular arthropathy at the first TMT joint with irregular cortical margin of the cuneiform as before. 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 Electronically Signed   By: Donavan Foil M.D.   On: 08/09/2020 21:03   DG Foot Complete Right  Result Date: 08/07/2020 CLINICAL DATA:  Chronic inflamed area along the medial foot with opened wound for 3 months, history of diabetes. EXAM: RIGHT FOOT COMPLETE - 3+ VIEW COMPARISON:  None FINDINGS: Extensive midfoot degenerative changes. Soft tissue swelling overlying the first tarsometatarsal joint and medial cuneiform. Gas in the soft tissues. Marked irregularity of the base of the first metatarsal and medial cuneiform with shift of the first metatarsal laterally. Irregularity of intermediate and lateral cuneiform as well with some signs of sclerosis. Marked osteoarthritic change within the first MTP. Subluxation of the distal phalanx of the second toe. No sign of acute fracture. IMPRESSION: 1. Soft tissue swelling and subcutaneous gas, compatible with soft tissue infection, potentially aggressive and likely related to reported ulceration. MRI may be helpful for further evaluation. 2. Midfoot degenerative changes with an appearance that likely is related to neuropathic process, presence of osteomyelitis particularly about the first metatarsal phalangeal joint is considered. 3. Subluxation of the second digit, distal phalanx may be chronic. Correlate with any acute pain in this area. These results will be called to the  ordering clinician or representative by the Radiologist Assistant, and communication documented in the PACS or [employer]. Electronically Signed   By: Zetta Bills M.D.   On: 08/07/2020 10:51   ECHOCARDIOGRAM COMPLETE  Result Date: 08/14/2020    ECHOCARDIOGRAM REPORT   Patient Name:  Martyn Ehrich Date of Exam: 08/14/2020 Medical Rec #:  [id number]     Height:       70.0 in Accession #:    [id number]    Weight:       180.0 lb Date of Birth:  Nov 07, 1951     BSA:          1.996 m Patient Age:    69 years      BP:           138/81 mmHg Patient Gender: M             HR:           70 bpm. Exam Location:  Inpatient Procedure: 2D Echo, Cardiac Doppler and Color Doppler Indications:    Atrial fibrillation  History:        Patient has no prior history of Echocardiogram examinations.                 Arrythmias:Atrial Fibrillation; Risk Factors:Hypertension and                 Diabetes.  Sonographer:    Dustin Flock Referring Phys: [id number] [place]  1. Left ventricular ejection fraction, by estimation, is 55 to 60%. The left ventricle has normal function. The left ventricle has no regional wall motion abnormalities. There is severe concentric left ventricular hypertrophy. Left ventricular diastolic  function could not be evaluated.  2. Right ventricular systolic function is normal. The right ventricular size is normal. There is normal pulmonary artery systolic pressure.  3. The mitral valve is grossly normal. No evidence of mitral valve regurgitation. No evidence of mitral stenosis.  4. The aortic valve is normal in structure. Aortic valve regurgitation is not visualized. No aortic stenosis is present. FINDINGS  Left Ventricle: Left ventricular ejection fraction, by estimation, is 55 to 60%. The left ventricle has normal function. The left ventricle has no regional wall motion abnormalities. The left ventricular internal cavity size was normal in size. There is  severe concentric left  ventricular hypertrophy. Left ventricular diastolic function could not be evaluated due to atrial fibrillation. Left ventricular diastolic function could not be evaluated. Right Ventricle: The right ventricular size is normal. No increase in right ventricular wall thickness. Right ventricular systolic function is normal. There is normal pulmonary artery systolic pressure. The tricuspid regurgitant velocity is 2.07 m/s, and  with an assumed right atrial pressure of 3 mmHg, the estimated right ventricular systolic pressure is 81.4 mmHg. Left Atrium: Left atrial size was normal in size. Right Atrium: Right atrial size was normal in size. Pericardium: There is no evidence of pericardial effusion. Mitral Valve: The mitral valve is grossly normal. No evidence of mitral valve regurgitation. No evidence of mitral valve stenosis. Tricuspid Valve: The tricuspid valve is normal in structure. Tricuspid valve regurgitation is trivial. Aortic Valve: The aortic valve is normal in structure. Aortic valve regurgitation is not visualized. No aortic stenosis is present. Pulmonic Valve: The pulmonic valve was normal in structure. Pulmonic valve regurgitation is not visualized. Aorta: The aortic root and ascending aorta are structurally normal, with no evidence of dilitation. IAS/Shunts: The atrial septum is grossly normal.  LEFT VENTRICLE PLAX 2D LVIDd:         4.50 cm  Diastology LVIDs:         3.50 cm  LV e' medial:    6.96 cm/s LV PW:         1.50 cm  LV E/e' medial:  12.3 LV IVS:        1.80 cm  LV e' lateral:   9.90 cm/s LVOT diam:     2.30 cm  LV E/e' lateral: 8.6 LV SV:         68 LV SV Index:   34 LVOT Area:     4.15 cm  RIGHT VENTRICLE RV Basal diam:  2.50 cm RV S prime:     14.40 cm/s TAPSE (M-mode): 2.7 cm LEFT ATRIUM             Index       RIGHT ATRIUM           Index LA diam:        3.50 cm 1.75 cm/m  RA Area:     14.00 cm LA Vol (A2C):   49.1 ml 24.60 ml/m RA Volume:   31.50 ml  15.78 ml/m LA Vol (A4C):   43.4 ml  21.74 ml/m LA Biplane Vol: 49.1 ml 24.60 ml/m  AORTIC VALVE LVOT Vmax:   80.50 cm/s LVOT Vmean:  53.500 cm/s LVOT VTI:    0.163 m  AORTA Ao Root diam: 3.60 cm MITRAL VALVE               TRICUSPID VALVE MV Area (PHT): 3.72 cm    TR Peak grad:   17.1 mmHg MV Decel Time: 204 msec    TR Vmax:        207.00 cm/s MV E velocity: 85.40 cm/s MV A velocity: 50.90 cm/s  SHUNTS MV E/A ratio:  1.68        Systemic VTI:  0.16 m                            Systemic Diam: 2.30 cm Mertie Moores MD Electronically signed by Mertie Moores MD Signature Date/Time: 08/14/2020/10:26:34 AM    Final    [place] EKG SITE RITE  Result Date: 08/14/2020 If Site Rite image not attached, placement could not be confirmed due to current cardiac rhythm.    Subjective: Patient seen and examined at bedside, resting comfortably.  No complaints this morning.  Discussed plan and discharge with spouse once again via telephone this morning.  She wishes her husband to start on Xarelto for his atrial fibrillation with elevated chads vas score.  Spouse has concerns of why her insurance is not in network with some of the home health agencies.  No other complaints or concerns at this time.  Patient denies headache, no visual changes, no chest pain, no palpitations, no shortness of breath, no abdominal pain, no weakness, no fatigue, no paresthesias.  No acute events overnight per nursing staff.  Discharge Exam: Vitals:   08/16/20 0500 08/16/20 0743  BP: 138/66 (!) 166/95  Pulse: 76 78  Resp: 17 18  Temp: 98.9 F (37.2 C) 97.9 F (36.6 C)  SpO2: 100% 100%   Vitals:   08/15/20 1559 08/15/20 1900 08/16/20 0500 08/16/20 0743  BP: 134/72 (!) 156/80 138/66 (!) 166/95  Pulse: 65 84 76 78  Resp: $Remo'16 18 17 18  'qKoJD$ Temp: 98.6 F (37 C) 99.2 F (37.3 C) 98.9 F (37.2 C) 97.9 F (36.6 C)  TempSrc: Oral Oral Oral Oral  SpO2: 98% 100% 100% 100%  Weight:      Height:        General: Pt is alert, awake, not in acute distress Cardiovascular: RRR,  S1/S2 +, no rubs, no gallops Respiratory: CTA bilaterally,  no wheezing, no rhonchi Abdominal: Soft, NT, ND, bowel sounds + Extremities: no edema, no cyanosis, right lower extremity with bandage with Ace wrap in place, clean/dry/intact    The results of significant diagnostics from this hospitalization (including imaging, microbiology, ancillary and laboratory) are listed below for reference.     Microbiology: Recent Results (from the past 240 hour(s))  WOUND CULTURE     Status: Abnormal   Collection Time: 08/07/20 10:31 AM   Specimen: Foot, Right; Wound  Result Value Ref Range Status   MICRO NUMBER: [id number]  Final   SPECIMEN QUALITY: Adequate  Final   SOURCE: WOUND (SITE NOT SPECIFIED)  Final   STATUS: FINAL  Final   GRAM STAIN:   Final    No white blood cells seen No epithelial cells seen Moderate Gram negative bacilli   RESULT:   Final    Additional organisms of questionable significance were isolated that normally do not warrant identification and susceptibilities. Please contact the laboratory within three days if identification and susceptibilities are clinically indicated.   ISOLATE 1: Staphylococcus aureus (A)  Final    Comment: Heavy growth of Staphylococcus aureus This isolate demonstrates inducible clindamycin resistance.      Susceptibility   Staphylococcus aureus - AEROBIC CULT, GRAM STAIN POSITIVE 1    VANCOMYCIN <=0.5 Sensitive     CIPROFLOXACIN <=0.5 Sensitive     CLINDAMYCIN <=0.25 Resistant     LEVOFLOXACIN 0.25 Sensitive     ERYTHROMYCIN >=8 Resistant     GENTAMICIN <=0.5 Sensitive     OXACILLIN 0.5 Sensitive     TETRACYCLINE >=16 Resistant     TRIMETH/SULFA* <=10 Sensitive      * Legend:S = Susceptible  I = IntermediateR = Resistant  NS = Not susceptible* = Not tested  NR = Not reported**NN = See antimicrobic comments  Culture, blood (Routine x 2)     Status: None   Collection Time: 08/08/20  8:50 PM   Specimen: BLOOD RIGHT ARM  Result Value Ref Range  Status   Specimen Description BLOOD RIGHT ARM  Final   Special Requests   Final    BOTTLES DRAWN AEROBIC AND ANAEROBIC Blood Culture adequate volume   Culture   Final    NO GROWTH 5 DAYS Performed at [facility], [address] [place], [place] [address]    Report Status 08/13/2020 FINAL  Final  Culture, blood (Routine x 2)     Status: None   Collection Time: 08/08/20  8:56 PM   Specimen: BLOOD RIGHT HAND  Result Value Ref Range Status   Specimen Description BLOOD RIGHT HAND  Final   Special Requests   Final    BOTTLES DRAWN AEROBIC ONLY Blood Culture results may not be optimal due to an inadequate volume of blood received in culture bottles   Culture   Final    NO GROWTH 5 DAYS Performed at [facility], [place] [address], [place], [place] [address]    Report Status 08/13/2020 FINAL  Final  SARS Coronavirus 2 by RT PCR (hospital order, performed in [facility] hospital lab) Nasopharyngeal Nasopharyngeal Swab     Status: None   Collection Time: 08/09/20 11:56 AM   Specimen: Nasopharyngeal Swab  Result Value Ref Range Status   SARS Coronavirus 2 NEGATIVE NEGATIVE Final    Comment: (NOTE) SARS-CoV-2 target nucleic acids are NOT DETECTED.  The SARS-CoV-2 RNA is generally detectable in upper and lower respiratory specimens during the acute phase of infection. The  lowest concentration of SARS-CoV-2 viral copies this assay can detect is 250 copies / mL. A negative result does not preclude SARS-CoV-2 infection and should not be used as the sole basis for treatment or other patient management decisions.  A negative result may occur with improper specimen collection / handling, submission of specimen other than nasopharyngeal swab, presence of viral mutation(s) within the areas targeted by this assay, and inadequate number of viral copies (<250 copies / mL). A negative result must be combined with clinical observations, patient history, and epidemiological  information.  Fact Sheet for Patients:   StrictlyIdeas.no  Fact Sheet for Healthcare Providers: BankingDealers.co.za  This test is not yet approved or  cleared by the [place] FDA and has been authorized for detection and/or diagnosis of SARS-CoV-2 by FDA under an Emergency Use Authorization (EUA).  This EUA will remain in effect (meaning this test can be used) for the duration of the COVID-19 declaration under Section 564(b)(1) of the Act, 21 U.S.C. section 360bbb-3(b)(1), unless the authorization is terminated or revoked sooner.  Performed at [facility], [place] [address], [place], [place] [address]   Aerobic/Anaerobic Culture (surgical/deep wound)     Status: None   Collection Time: 08/09/20  6:27 PM   Specimen: PATH Other; Tissue  Result Value Ref Range Status   Specimen Description ABSCESS RIGHT FOOT  Final   Special Requests SPEC A  Final   Gram Stain   Final    FEW WBC PRESENT, PREDOMINANTLY PMN ABUNDANT GRAM POSITIVE COCCI ABUNDANT GRAM NEGATIVE RODS    Culture   Final    ABUNDANT STREPTOCOCCUS ANGINOSIS MODERATE STAPHYLOCOCCUS AUREUS ABUNDANT BACTEROIDES FRAGILIS BETA LACTAMASE POSITIVE Performed at [facility], [place] [address], [place], [place] [address]    Report Status 08/14/2020 FINAL  Final   Organism ID, Bacteria STREPTOCOCCUS ANGINOSIS  Final   Organism ID, Bacteria STAPHYLOCOCCUS AUREUS  Final      Susceptibility   Staphylococcus aureus - MIC*    CIPROFLOXACIN <=0.5 SENSITIVE Sensitive     ERYTHROMYCIN >=8 RESISTANT Resistant     GENTAMICIN <=0.5 SENSITIVE Sensitive     OXACILLIN <=0.25 SENSITIVE Sensitive     TETRACYCLINE >=16 RESISTANT Resistant     VANCOMYCIN <=0.5 SENSITIVE Sensitive     TRIMETH/SULFA <=10 SENSITIVE Sensitive     CLINDAMYCIN RESISTANT Resistant     RIFAMPIN <=0.5 SENSITIVE Sensitive     Inducible Clindamycin POSITIVE Resistant     * MODERATE STAPHYLOCOCCUS AUREUS    Streptococcus anginosis - MIC*    PENICILLIN <=0.06 SENSITIVE Sensitive     CEFTRIAXONE 0.25 SENSITIVE Sensitive     ERYTHROMYCIN 2 RESISTANT Resistant     LEVOFLOXACIN 0.5 SENSITIVE Sensitive     VANCOMYCIN 0.5 SENSITIVE Sensitive     * ABUNDANT STREPTOCOCCUS ANGINOSIS  Aerobic/Anaerobic Culture (surgical/deep wound)     Status: None   Collection Time: 08/09/20  6:34 PM   Specimen: PATH Other; Tissue  Result Value Ref Range Status   Specimen Description TISSUE  Final   Special Requests FIRST METATARSAL BONE CULTURE SPEC B  Final   Gram Stain NO WBC SEEN FEW GRAM POSITIVE COCCI   Final   Culture   Final    FEW STAPHYLOCOCCUS AUREUS MODERATE BACTEROIDES FRAGILIS BETA LACTAMASE POSITIVE Performed at [facility], [address], [place], [place] [address]    Report Status 08/15/2020 FINAL  Final   Organism ID, Bacteria STAPHYLOCOCCUS AUREUS  Final      Susceptibility  Staphylococcus aureus - MIC*    CIPROFLOXACIN <=0.5 SENSITIVE Sensitive     ERYTHROMYCIN >=8 RESISTANT Resistant     GENTAMICIN <=0.5 SENSITIVE Sensitive     OXACILLIN <=0.25 SENSITIVE Sensitive     TETRACYCLINE >=16 RESISTANT Resistant     VANCOMYCIN <=0.5 SENSITIVE Sensitive     TRIMETH/SULFA <=10 SENSITIVE Sensitive     CLINDAMYCIN RESISTANT Resistant     RIFAMPIN <=0.5 SENSITIVE Sensitive     Inducible Clindamycin POSITIVE Resistant     * FEW STAPHYLOCOCCUS AUREUS  Surgical pcr screen     Status: Abnormal   Collection Time: 08/12/20  2:20 AM   Specimen: Nasal Mucosa; Nasal Swab  Result Value Ref Range Status   MRSA, PCR NEGATIVE NEGATIVE Final   Staphylococcus aureus POSITIVE (A) NEGATIVE Final    Comment: (NOTE) The Xpert SA Assay (FDA approved for NASAL specimens in patients 78 years of age and older), is one component of a comprehensive surveillance program. It is not intended to diagnose infection nor to guide or monitor treatment. Performed at [facility], [place] [address],  [place], [place] [address]   Aerobic/Anaerobic Culture (surgical/deep wound)     Status: None (Preliminary result)   Collection Time: 08/12/20 10:54 AM   Specimen: Wound; Abscess  Result Value Ref Range Status   Specimen Description WOUND RIGHT FOOT  Final   Special Requests SAMPLE A PREWASH  Final   Gram Stain   Final    NO WBC SEEN RARE GRAM POSITIVE COCCI IN PAIRS Performed at [facility], [address], [place], [place] [address]    Culture   Final    RARE STREPTOCOCCUS ANGINOSIS RARE STAPHYLOCOCCUS AUREUS NO ANAEROBES ISOLATED; CULTURE IN PROGRESS FOR 5 DAYS    Report Status PENDING  Incomplete   Organism ID, Bacteria STAPHYLOCOCCUS AUREUS  Final      Susceptibility   Staphylococcus aureus - MIC*    CIPROFLOXACIN <=0.5 SENSITIVE Sensitive     ERYTHROMYCIN >=8 RESISTANT Resistant     GENTAMICIN <=0.5 SENSITIVE Sensitive     OXACILLIN <=0.25 SENSITIVE Sensitive     TETRACYCLINE >=16 RESISTANT Resistant     VANCOMYCIN <=0.5 SENSITIVE Sensitive     TRIMETH/SULFA <=10 SENSITIVE Sensitive     CLINDAMYCIN RESISTANT Resistant     RIFAMPIN <=0.5 SENSITIVE Sensitive     Inducible Clindamycin POSITIVE Resistant     * RARE STAPHYLOCOCCUS AUREUS  Aerobic/Anaerobic Culture (surgical/deep wound)     Status: None (Preliminary result)   Collection Time: 08/12/20 10:55 AM   Specimen: Wound; Abscess  Result Value Ref Range Status   Specimen Description WOUND RIGHT FOOT  Final   Special Requests SAMPLE B POST [place]  Final   Gram Stain NO WBC SEEN NO ORGANISMS SEEN   Final   Culture   Final    NO GROWTH 4 DAYS Performed at [facility], [address], [place], [place] [address]    Report Status PENDING  Incomplete     Labs: BNP (last 3 results) No results for input(s): BNP in the last 8760 hours. Basic Metabolic Panel: Recent Labs  Lab 08/11/20 0431 08/13/20 0517 08/14/20 0441 08/15/20 0728 08/16/20 0341  NA 136 135 135 137 137  K 3.8 3.8 3.6 3.2* 4.1  CL 101 98  101 99 103  CO2 $Re'29 30 29 29 28  'kya$ GLUCOSE 236* 211* 216* 134* 130*  BUN $Re'17 13 19 17 15  'UKF$ CREATININE 1.17 1.13 1.21 1.11 1.08  CALCIUM  8.3* 8.3* 8.3* 8.3* 8.3*  MG  --   --   --   --  1.8   Liver Function Tests: Recent Labs  Lab 08/10/20 0628  AST 12*  ALT 16  ALKPHOS 53  BILITOT 1.0  PROT 5.2*  ALBUMIN 2.4*   No results for input(s): LIPASE, AMYLASE in the last 168 hours. No results for input(s): AMMONIA in the last 168 hours. CBC: Recent Labs  Lab 08/11/20 0431 08/13/20 0517 08/13/20 1254 08/15/20 0728 08/16/20 0341  WBC 11.7* 9.3 9.3 8.2 7.0  NEUTROABS  --  7.6 7.7  --   --   HGB 10.8* 10.2* 11.1* 10.6* 9.8*  HCT 32.2* 31.3* 34.0* 32.6* 30.3*  MCV 94.7 93.4 93.2 93.4 93.5  PLT 187 181 207 228 214   Cardiac Enzymes: No results for input(s): CKTOTAL, CKMB, CKMBINDEX, TROPONINI in the last 168 hours. BNP: Invalid input(s): POCBNP CBG: Recent Labs  Lab 08/15/20 1625 08/15/20 1921 08/16/20 0640 08/16/20 0744 08/16/20 1146  GLUCAP 127* 157* 111* 112* 251*   D-Dimer No results for input(s): DDIMER in the last 72 hours. Hgb A1c No results for input(s): HGBA1C in the last 72 hours. Lipid Profile No results for input(s): CHOL, HDL, LDLCALC, TRIG, CHOLHDL, LDLDIRECT in the last 72 hours. Thyroid function studies No results for input(s): TSH, T4TOTAL, T3FREE, THYROIDAB in the last 72 hours.  Invalid input(s): FREET3 Anemia work up No results for input(s): VITAMINB12, FOLATE, FERRITIN, TIBC, IRON, RETICCTPCT in the last 72 hours. Urinalysis No results found for: COLORURINE, APPEARANCEUR, LABSPEC, PHURINE, GLUCOSEU, HGBUR, BILIRUBINUR, KETONESUR, PROTEINUR, UROBILINOGEN, NITRITE, LEUKOCYTESUR Sepsis Labs Invalid input(s): PROCALCITONIN,  WBC,  LACTICIDVEN Microbiology Recent Results (from the past 240 hour(s))  WOUND CULTURE     Status: Abnormal   Collection Time: 08/07/20 10:31 AM   Specimen: Foot, Right; Wound  Result Value Ref Range Status   MICRO NUMBER:  [id number]  Final   SPECIMEN QUALITY: Adequate  Final   SOURCE: WOUND (SITE NOT SPECIFIED)  Final   STATUS: FINAL  Final   GRAM STAIN:   Final    No white blood cells seen No epithelial cells seen Moderate Gram negative bacilli   RESULT:   Final    Additional organisms of questionable significance were isolated that normally do not warrant identification and susceptibilities. Please contact the laboratory within three days if identification and susceptibilities are clinically indicated.   ISOLATE 1: Staphylococcus aureus (A)  Final    Comment: Heavy growth of Staphylococcus aureus This isolate demonstrates inducible clindamycin resistance.      Susceptibility   Staphylococcus aureus - AEROBIC CULT, GRAM STAIN POSITIVE 1    VANCOMYCIN <=0.5 Sensitive     CIPROFLOXACIN <=0.5 Sensitive     CLINDAMYCIN <=0.25 Resistant     LEVOFLOXACIN 0.25 Sensitive     ERYTHROMYCIN >=8 Resistant     GENTAMICIN <=0.5 Sensitive     OXACILLIN 0.5 Sensitive     TETRACYCLINE >=16 Resistant     TRIMETH/SULFA* <=10 Sensitive      * Legend:S = Susceptible  I = IntermediateR = Resistant  NS = Not susceptible* = Not tested  NR = Not reported**NN = See antimicrobic comments  Culture, blood (Routine x 2)     Status: None   Collection Time: 08/08/20  8:50 PM   Specimen: BLOOD RIGHT ARM  Result Value Ref Range Status   Specimen Description BLOOD RIGHT ARM  Final   Special Requests   Final    BOTTLES DRAWN AEROBIC  AND ANAEROBIC Blood Culture adequate volume   Culture   Final    NO GROWTH 5 DAYS Performed at [facility], [place] [address] [place], [place] [address]    Report Status 08/13/2020 FINAL  Final  Culture, blood (Routine x 2)     Status: None   Collection Time: 08/08/20  8:56 PM   Specimen: BLOOD RIGHT HAND  Result Value Ref Range Status   Specimen Description BLOOD RIGHT HAND  Final   Special Requests   Final    BOTTLES DRAWN AEROBIC ONLY Blood Culture results may not be optimal due to an  inadequate volume of blood received in culture bottles   Culture   Final    NO GROWTH 5 DAYS Performed at [facility], [place] [address], [place], [place] [address]    Report Status 08/13/2020 FINAL  Final  SARS Coronavirus 2 by RT PCR (hospital order, performed in [facility] hospital lab) Nasopharyngeal Nasopharyngeal Swab     Status: None   Collection Time: 08/09/20 11:56 AM   Specimen: Nasopharyngeal Swab  Result Value Ref Range Status   SARS Coronavirus 2 NEGATIVE NEGATIVE Final    Comment: (NOTE) SARS-CoV-2 target nucleic acids are NOT DETECTED.  The SARS-CoV-2 RNA is generally detectable in upper and lower respiratory specimens during the acute phase of infection. The lowest concentration of SARS-CoV-2 viral copies this assay can detect is 250 copies / mL. A negative result does not preclude SARS-CoV-2 infection and should not be used as the sole basis for treatment or other patient management decisions.  A negative result may occur with improper specimen collection / handling, submission of specimen other than nasopharyngeal swab, presence of viral mutation(s) within the areas targeted by this assay, and inadequate number of viral copies (<250 copies / mL). A negative result must be combined with clinical observations, patient history, and epidemiological information.  Fact Sheet for Patients:   StrictlyIdeas.no  Fact Sheet for Healthcare Providers: BankingDealers.co.za  This test is not yet approved or  cleared by the [place] FDA and has been authorized for detection and/or diagnosis of SARS-CoV-2 by FDA under an Emergency Use Authorization (EUA).  This EUA will remain in effect (meaning this test can be used) for the duration of the COVID-19 declaration under Section 564(b)(1) of the Act, 21 U.S.C. section 360bbb-3(b)(1), unless the authorization is terminated or revoked sooner.  Performed at [place], [place] [address] [place], [place] [address]   Aerobic/Anaerobic Culture (surgical/deep wound)     Status: None   Collection Time: 08/09/20  6:27 PM   Specimen: PATH Other; Tissue  Result Value Ref Range Status   Specimen Description ABSCESS RIGHT FOOT  Final   Special Requests SPEC A  Final   Gram Stain   Final    FEW WBC PRESENT, PREDOMINANTLY PMN ABUNDANT GRAM POSITIVE COCCI ABUNDANT GRAM NEGATIVE RODS    Culture   Final    ABUNDANT STREPTOCOCCUS ANGINOSIS MODERATE STAPHYLOCOCCUS AUREUS ABUNDANT BACTEROIDES FRAGILIS BETA LACTAMASE POSITIVE Performed at [facility], [place] [address], [place], [place] [address]    Report Status 08/14/2020 FINAL  Final   Organism ID, Bacteria STREPTOCOCCUS ANGINOSIS  Final   Organism ID, Bacteria STAPHYLOCOCCUS AUREUS  Final      Susceptibility   Staphylococcus aureus - MIC*    CIPROFLOXACIN <=0.5 SENSITIVE Sensitive     ERYTHROMYCIN >=8 RESISTANT Resistant     GENTAMICIN <=0.5 SENSITIVE Sensitive     OXACILLIN <=  0.25 SENSITIVE Sensitive     TETRACYCLINE >=16 RESISTANT Resistant     VANCOMYCIN <=0.5 SENSITIVE Sensitive     TRIMETH/SULFA <=10 SENSITIVE Sensitive     CLINDAMYCIN RESISTANT Resistant     RIFAMPIN <=0.5 SENSITIVE Sensitive     Inducible Clindamycin POSITIVE Resistant     * MODERATE STAPHYLOCOCCUS AUREUS   Streptococcus anginosis - MIC*    PENICILLIN <=0.06 SENSITIVE Sensitive     CEFTRIAXONE 0.25 SENSITIVE Sensitive     ERYTHROMYCIN 2 RESISTANT Resistant     LEVOFLOXACIN 0.5 SENSITIVE Sensitive     VANCOMYCIN 0.5 SENSITIVE Sensitive     * ABUNDANT STREPTOCOCCUS ANGINOSIS  Aerobic/Anaerobic Culture (surgical/deep wound)     Status: None   Collection Time: 08/09/20  6:34 PM   Specimen: PATH Other; Tissue  Result Value Ref Range Status   Specimen Description TISSUE  Final   Special Requests FIRST METATARSAL BONE CULTURE SPEC B  Final   Gram Stain NO WBC SEEN FEW GRAM POSITIVE COCCI   Final   Culture   Final    FEW  STAPHYLOCOCCUS AUREUS MODERATE BACTEROIDES FRAGILIS BETA LACTAMASE POSITIVE Performed at [facility], [address] [place], [place] [address]    Report Status 08/15/2020 FINAL  Final   Organism ID, Bacteria STAPHYLOCOCCUS AUREUS  Final      Susceptibility   Staphylococcus aureus - MIC*    CIPROFLOXACIN <=0.5 SENSITIVE Sensitive     ERYTHROMYCIN >=8 RESISTANT Resistant     GENTAMICIN <=0.5 SENSITIVE Sensitive     OXACILLIN <=0.25 SENSITIVE Sensitive     TETRACYCLINE >=16 RESISTANT Resistant     VANCOMYCIN <=0.5 SENSITIVE Sensitive     TRIMETH/SULFA <=10 SENSITIVE Sensitive     CLINDAMYCIN RESISTANT Resistant     RIFAMPIN <=0.5 SENSITIVE Sensitive     Inducible Clindamycin POSITIVE Resistant     * FEW STAPHYLOCOCCUS AUREUS  Surgical pcr screen     Status: Abnormal   Collection Time: 08/12/20  2:20 AM   Specimen: Nasal Mucosa; Nasal Swab  Result Value Ref Range Status   MRSA, PCR NEGATIVE NEGATIVE Final   Staphylococcus aureus POSITIVE (A) NEGATIVE Final    Comment: (NOTE) The Xpert SA Assay (FDA approved for NASAL specimens in patients 76 years of age and older), is one component of a comprehensive surveillance program. It is not intended to diagnose infection nor to guide or monitor treatment. Performed at [facility], [place] [address] [place], [place] [address]   Aerobic/Anaerobic Culture (surgical/deep wound)     Status: None (Preliminary result)   Collection Time: 08/12/20 10:54 AM   Specimen: Wound; Abscess  Result Value Ref Range Status   Specimen Description WOUND RIGHT FOOT  Final   Special Requests SAMPLE A PREWASH  Final   Gram Stain   Final    NO WBC SEEN RARE GRAM POSITIVE COCCI IN PAIRS Performed at [facility], [address], [place], [place] [address]    Culture   Final    RARE STREPTOCOCCUS ANGINOSIS RARE STAPHYLOCOCCUS AUREUS NO ANAEROBES ISOLATED; CULTURE IN PROGRESS FOR 5 DAYS    Report Status PENDING  Incomplete   Organism  ID, Bacteria STAPHYLOCOCCUS AUREUS  Final      Susceptibility   Staphylococcus aureus - MIC*    CIPROFLOXACIN <=0.5 SENSITIVE Sensitive     ERYTHROMYCIN >=8 RESISTANT Resistant     GENTAMICIN <=0.5 SENSITIVE Sensitive     OXACILLIN <=0.25 SENSITIVE Sensitive     TETRACYCLINE >=16 RESISTANT  Resistant     VANCOMYCIN <=0.5 SENSITIVE Sensitive     TRIMETH/SULFA <=10 SENSITIVE Sensitive     CLINDAMYCIN RESISTANT Resistant     RIFAMPIN <=0.5 SENSITIVE Sensitive     Inducible Clindamycin POSITIVE Resistant     * RARE STAPHYLOCOCCUS AUREUS  Aerobic/Anaerobic Culture (surgical/deep wound)     Status: None (Preliminary result)   Collection Time: 08/12/20 10:55 AM   Specimen: Wound; Abscess  Result Value Ref Range Status   Specimen Description WOUND RIGHT FOOT  Final   Special Requests SAMPLE B POST [place]  Final   Gram Stain NO WBC SEEN NO ORGANISMS SEEN   Final   Culture   Final    NO GROWTH 4 DAYS Performed at [facility], [address], [place], [place] [address]    Report Status PENDING  Incomplete     Time coordinating discharge: Over 30 minutes  SIGNED:   Donnamarie Poag [place] DO  [facility] 08/16/2020, 12:10 PM

## 2020-08-16 NOTE — Discharge Instructions (Signed)
Information on my medicine - XARELTO (Rivaroxaban)  This medication education was reviewed with me or my healthcare representative as part of my discharge preparation.    Why was Xarelto prescribed for you? Xarelto was prescribed for you to reduce the risk of a blood clot forming that can cause a stroke if you have a medical condition called atrial fibrillation (a type of irregular heartbeat).  What do you need to know about xarelto ? Take your Xarelto ONCE DAILY at the same time every day with your evening meal. If you have difficulty swallowing the tablet whole, you may crush it and mix in applesauce just prior to taking your dose.  Take Xarelto exactly as prescribed by your doctor and DO NOT stop taking Xarelto without talking to the doctor who prescribed the medication.  Stopping without other stroke prevention medication to take the place of Xarelto may increase your risk of developing a clot that causes a stroke.  Refill your prescription before you run out.  After discharge, you should have regular check-up appointments with your healthcare provider that is prescribing your Xarelto.  In the future your dose may need to be changed if your kidney function or weight changes by a significant amount.  What do you do if you miss a dose? If you are taking Xarelto ONCE DAILY and you miss a dose, take it as soon as you remember on the same day then continue your regularly scheduled once daily regimen the next day. Do not take two doses of Xarelto at the same time or on the same day.   Important Safety Information A possible side effect of Xarelto is bleeding. You should call your healthcare provider right away if you experience any of the following: ? Bleeding from an injury or your nose that does not stop. ? Unusual colored urine (red or dark brown) or unusual colored stools (red or black). ? Unusual bruising for unknown reasons. ? A serious fall or if you hit your head (even if  there is no bleeding).  Some medicines may interact with Xarelto and might increase your risk of bleeding while on Xarelto. To help avoid this, consult your healthcare provider or pharmacist prior to using any new prescription or non-prescription medications, including herbals, vitamins, non-steroidal anti-inflammatory drugs (NSAIDs) and supplements.  This website has more information on Xarelto: [email or web address].[email or web address].[email or web address].  =-===========================================   Atrial Fibrillation  Atrial fibrillation is a type of heartbeat that is irregular or fast. If you have this condition, your heart beats without any order. This makes it hard for your heart to pump blood in a normal way. Atrial fibrillation may come and go, or it may become a long-lasting problem. If this condition is not treated, it can put you at higher risk for stroke, heart failure, and other heart problems. What are the causes? This condition may be caused by diseases that damage the heart. They include:  High blood pressure.  Heart failure.  Heart valve disease.  Heart surgery. Other causes include:  Diabetes.  Thyroid disease.  Being overweight.  Kidney disease. Sometimes the cause is not known. What increases the risk? You are more likely to develop this condition if:  You are older.  You smoke.  You exercise often and very hard.  You have a family history of this condition.  You are a man.  You use drugs.  You drink a lot of alcohol.  You have lung conditions, such as emphysema, pneumonia, or COPD.  You have sleep apnea.  What are the signs or symptoms? Common symptoms of this condition include:  A feeling that your heart is beating very fast.  Chest pain or discomfort.  Feeling short of breath.  Suddenly feeling light-headed or weak.  Getting tired easily during activity.  Fainting.  Sweating. In some cases, there are no symptoms. How is this treated? Treatment for this  condition depends on underlying conditions and how you feel when you have atrial fibrillation. They include:  Medicines to: ? Prevent blood clots. ? Treat heart rate or heart rhythm problems.  Using devices, such as a pacemaker, to correct heart rhythm problems.  Doing surgery to remove the part of the heart that sends bad signals.  Closing an area where clots can form in the heart (left atrial appendage). In some cases, your doctor will treat other underlying conditions. Follow these instructions at home: Medicines  Take over-the-counter and prescription medicines only as told by your doctor.  Do not take any new medicines without first talking to your doctor.  If you are taking blood thinners: ? Talk with your doctor before you take any medicines that have aspirin or NSAIDs, such as ibuprofen, in them. ? Take your medicine exactly as told by your doctor. Take it at the same time each day. ? Avoid activities that could hurt or bruise you. Follow instructions about how to prevent falls. ? Wear a bracelet that says you are taking blood thinners. Or, carry a card that lists what medicines you take. Lifestyle      Do not use any products that have nicotine or tobacco in them. These include cigarettes, e-cigarettes, and chewing tobacco. If you need help quitting, ask your doctor.  Eat heart-healthy foods. Talk with your doctor about the right eating plan for you.  Exercise regularly as told by your doctor.  Do not drink alcohol.  Lose weight if you are overweight.  Do not use drugs, including cannabis. General instructions  If you have a condition that causes breathing to stop for a short period of time (apnea), treat it as told by your doctor.  Keep a healthy weight. Do not use diet pills unless your doctor says they are safe for you. Diet pills may make heart problems worse.  Keep all follow-up visits as told by your doctor. This is important. Contact a doctor if:  You  notice a change in the speed, rhythm, or strength of your heartbeat.  You are taking a blood-thinning medicine and you get more bruising.  You get tired more easily when you move or exercise.  You have a sudden change in weight. Get help right away if:   You have pain in your chest or your belly (abdomen).  You have trouble breathing.  You have side effects of blood thinners, such as blood in your vomit, poop (stool), or pee (urine), or bleeding that cannot stop.  You have any signs of a stroke. "BE FAST" is an easy way to remember the main warning signs: ? B - Balance. Signs are dizziness, sudden trouble walking, or loss of balance. ? E - Eyes. Signs are trouble seeing or a change in how you see. ? F - Face. Signs are sudden weakness or loss of feeling in the face, or the face or eyelid drooping on one side. ? A - Arms. Signs are weakness or loss of feeling in an arm. This happens suddenly and usually on one side of the body. ? S - Speech. Signs are sudden trouble  speaking, slurred speech, or trouble understanding what people say. ? T - Time. Time to call emergency services. Write down what time symptoms started.  You have other signs of a stroke, such as: ? A sudden, very bad headache with no known cause. ? Feeling like you may vomit (nausea). ? Vomiting. ? A seizure. These symptoms may be an emergency. Do not wait to see if the symptoms will go away. Get medical help right away. Call your local emergency services (911 in the [place] Do not drive yourself to the hospital. Summary  Atrial fibrillation is a type of heartbeat that is irregular or fast.  You are at higher risk of this condition if you smoke, are older, have diabetes, or are overweight.  Follow your doctor's instructions about medicines, diet, exercise, and follow-up visits.  Get help right away if you have signs or symptoms of a stroke.  Get help right away if you cannot catch your breath, or you have chest pain  or discomfort. This information is not intended to replace advice given to you by your health care provider. Make sure you discuss any questions you have with your health care provider. Document Revised: 05/10/2019 Document Reviewed: 05/10/2019 Elsevier Patient Education  2020 [employer]   Osteomyelitis, Adult  Bone infections (osteomyelitis) occur when bacteria or other germs get inside a bone. This can happen if you have an infection in another part of your body that spreads through your blood. Germs from your skin or from outside of your body can also cause this type of infection if you have a wound or a broken bone (fracture) that breaks the skin. Bone infections need to be treated quickly to prevent bone damage and to prevent the infection from spreading to other areas of your body. What are the causes? Most bone infections are caused by bacteria. They can also be caused by other germs, such as viruses and funguses. What increases the risk? You are more likely to develop this condition if you:  Recently had surgery, especially bone or joint surgery.  Have a long-term (chronic) disease, such as: ? Diabetes. ? HIV (human immunodeficiency virus). ? Rheumatoid arthritis. ? Sickle cell anemia. ? Kidney disease that requires dialysis.  Are aged 17 years or older.  Have a condition or take medicines that block or weaken your body's defense system (immune system).  Have a condition that reduces your blood flow.  Have an artificial joint.  Have had a joint or bone repaired with plates or screws (surgical hardware).  Use IV drugs.  Have a central line for IV access.  Have had trauma, such as stepping on a nail or a broken bone that came through the skin. What are the signs or symptoms? Symptoms vary depending on the type and location of your infection. Common symptoms of bone infections include:  Fever and chills.  Skin redness and warmth.  Swelling.  Pain and  stiffness.  Drainage of fluid or pus near the infection. How is this diagnosed? This condition may be diagnosed based on:  Your symptoms and medical history.  A physical exam.  Tests, such as: ? A sample of tissue, fluid, or blood taken to be examined under a microscope. ? Pus or discharge swabbed from a wound for testing to identify germs and to determine what type of medicine will kill them (culture and sensitivity). ? Blood tests.  Imaging studies. These may include: ? X-rays. ? MRI. ? CT scan. ? Bone scan. ? Ultrasound. How  is this treated? Treatment for this condition depends on the cause and type of infection. Antibiotic medicines are usually the first treatment for a bone infection. This may be done in a hospital at first. You may have to continue IV antibiotics at home or take antibiotics by mouth for several weeks after that. Other treatments may include surgery to remove:  Dead or dying tissue from a bone.  An infected artificial joint.  Infected plates or screws that were used to repair a broken bone. Follow these instructions at home: Medicines   Take over-the-counter and prescription medicines only as told by your health care provider.  Take your antibiotic medicine as told by your health care provider. Do not stop taking the antibiotic even if you start to feel better.  Follow instructions from your health care provider about how to take IV antibiotics at home. You may need to have a nurse come to your home to give you the IV antibiotics. General instructions   Ask your health care provider if you have any restrictions on your activities.  If directed, put ice on the affected area: ? Put ice in a plastic bag. ? Place a towel between your skin and the bag. ? Leave the ice on for 20 minutes, 2-3 times a day.  Wash your hands often with soap and water. If soap and water are not available, use hand sanitizer.  Do not use any products that contain nicotine  or tobacco, such as cigarettes and e-cigarettes. These can delay bone healing. If you need help quitting, ask your health care provider.  Keep all follow-up visits as told by your health care provider. This is important. Contact a health care provider if:  You develop a fever or chills.  You have redness, warmth, pain, or swelling that returns after treatment. Get help right away if:  You have rapid breathing or you have trouble breathing.  You have chest pain.  You cannot drink fluids or make urine.  The affected area swells, changes color, or turns blue.  You have numbness or severe pain in the affected area. Summary  Bone infections (osteomyelitis) occur when bacteria or other germs get inside a bone.  You may be more likely to get this type of infection if you have a condition, such as diabetes, that lowers your ability to fight infection or increases your chances of getting an infection.  Most bone infections are caused by bacteria. They can also be caused by other germs, such as viruses and funguses.  Treatment for this condition usually starts with taking antibiotics. Further treatment depends on the cause and type of infection. This information is not intended to replace advice given to you by your health care provider. Make sure you discuss any questions you have with your health care provider. Document Revised: 12/02/2017 Document Reviewed: 11/25/2017 Elsevier Patient Education  2020 [employer].

## 2020-08-16 NOTE — Progress Notes (Signed)
Subjective: POD #4 s/p wound debridement, removal of antibiotic beads, delayed primary closure with application of skin substitute. States he is doing well and pain is controlled. He has no concerns today. Denies any fevers, chills, nausea, vomiting. No calf pain, chest pain, shortness of breath.  Objective: AAO x3, NAD DP/PT pulses palpable bilaterally, CRT less than 3 seconds RLE: Bandage clean, dry, intact without any strike through. Dressing was changed today. Integra graft is present to the dorsal medial aspect of the foot and the incisions are coapted with sutures intact. Mild edema and erythema but there is no significant warmth of the foot there is no draining purulence there is no fluctuation crepitation. LLE without any ulceration  No pain with calf compression, swelling, warmth, erythema          Assessment: POD #4 s/p return to the OR for wound debridement, closure, graft application  Plan: Dressing was changed today. Betadine was placed over the incisions dorsally and a nonadherent dressing was applied to the graft site. Dry dressing was then applied. Keep the dressing clean, dry, intact. Encourage elevation and nonweightbearing. Antibiotics per infectious disease. We will follow up with him as an outpatient.  Dean Rasmussen, DPM

## 2020-08-16 NOTE — Progress Notes (Signed)
Occupational Therapy Treatment Patient Details Name: Dean Rasmussen MRN: [id number] DOB: June 08, 1951 Today's Date: 08/16/2020    History of present illness 69 year old male with past medical history for uncontrolled type 2 diabetes mellitus and hypertension.  Patient reported about 3-week history of erythema and edema at the right midfoot, rapidly progressed and worsening.  Outpatient evaluation with an MRI showed large abscess with possible osteomyelitis. s/p surgical debridement 08/12/20.   OT comments  Pt progressing with OT goals demonstrating improved stability maneuvering knee scooter. Session focused on safety with ADL transfers using knee scooter with good carryover noted. Pt motivated to return home soon and reports wife still plans to assist as needed with tasks.    Follow Up Recommendations  Home health OT (Supervision for OOB activities and mobility )    Equipment Recommendations  Tub/shower bench;Other (comment) (RW, knee scooter)    Recommendations for Other Services      Precautions / Restrictions Precautions Precautions: Fall Precaution Comments: low fall Restrictions Weight Bearing Restrictions: Yes RLE Weight Bearing: Non weight bearing       Mobility Bed Mobility               General bed mobility comments: up in recliner  Transfers Overall transfer level: Needs assistance Equipment used:  (knee scooter) Transfers: Sit to/from [employer] Transfers Sit to Stand: Min guard Stand pivot transfers: Min guard       General transfer comment: min guard for steadying knee scooter and ensuring safety, improving stability and turning with scooter    Balance Overall balance assessment: Needs assistance Sitting-balance support: No upper extremity supported;Feet supported Sitting balance-Leahy Scale: Good     Standing balance support: During functional activity;Single extremity supported Standing balance-Leahy Scale: Poor Standing balance comment:  reliant on at least one UE support during dynamic tasks                           ADL either performed or assessed with clinical judgement   ADL Overall ADL's : Needs assistance/impaired                         Toilet Transfer: Min guard;Ambulation;BSC;Regular Toilet;Grab bars (knee scooter) Toilet Transfer Details (indicate cue type and reason): min guard for transfer to knee scooter and toilet. min guard for mobility using knee scooter in/out of bathroom with minor cues for manuevering, improved stability and problem solving with tasks         Functional mobility during ADLs: Min guard (knee scooter) General ADL Comments: Pt with improving stability using knee scooter, min guard and stabilizing of knee scooter during transitional movements. Educated on safety with transitional movements to/from knee scooter, use of sturdy surfaces only in home. Noted with tub bench delivered in room     Vision   Vision Assessment?: No apparent visual deficits   Perception     Praxis      Cognition Arousal/Alertness: Awake/alert Behavior During Therapy: WFL for tasks assessed/performed Overall Cognitive Status: Within Functional Limits for tasks assessed                                          Exercises     Shoulder Instructions       General Comments HR WFL during session.     Pertinent Vitals/ Pain  Pain Assessment: Faces Faces Pain Scale: Hurts a little bit Pain Location: L shoulder Pain Descriptors / Indicators: Aching;Sore Pain Intervention(s): Monitored during session;Limited activity within patient's tolerance;Premedicated before session  Home Living                                          Prior Functioning/Environment              Frequency  Min 2X/week        Progress Toward Goals  OT Goals(current goals can now be found in the care plan section)  Progress towards OT goals: Progressing toward  goals  Acute Rehab OT Goals Patient Stated Goal: go home safely and with needed DME OT Goal Formulation: With patient Time For Goal Achievement: 08/27/20 Potential to Achieve Goals: Good ADL Goals Pt Will Perform Grooming: with modified independence;standing Pt Will Perform Lower Body Bathing: with modified independence;sit to/from stand Pt Will Perform Lower Body Dressing: with modified independence;sit to/from stand;sitting/lateral leans Pt Will Transfer to Toilet: with modified independence;ambulating;bedside commode Pt Will Perform Toileting - Clothing Manipulation and hygiene: with modified independence;sitting/lateral leans;sit to/from stand Pt Will Perform Tub/Shower Transfer: Tub transfer;with set-up;ambulating;tub bench Pt/caregiver will Perform Home Exercise Program: Left upper extremity;Independently;With written HEP provided  Plan Discharge plan remains appropriate    Co-evaluation                 AM-PAC OT "6 Clicks" Daily Activity     Outcome Measure   Help from another person eating meals?: None Help from another person taking care of personal grooming?: A Little Help from another person toileting, which includes using toliet, bedpan, or urinal?: A Little Help from another person bathing (including washing, rinsing, drying)?: A Little Help from another person to put on and taking off regular upper body clothing?: A Little Help from another person to put on and taking off regular lower body clothing?: A Little 6 Click Score: 19    End of Session Equipment Utilized During Treatment: Gait belt;Other (comment) (knee scooter)  OT Visit Diagnosis: Unsteadiness on feet (R26.81);Other abnormalities of gait and mobility (R26.89);Muscle weakness (generalized) (M62.81);Pain Pain - Right/Left: Left (L shoulder, R foot) Pain - part of body: Shoulder;Ankle and joints of foot (L shoulder, R foot)   Activity Tolerance Patient tolerated treatment well   Patient Left in  chair;with call bell/phone within reach;with nursing/sitter in room   Nurse Communication Mobility status (RN present during session)        Time: 2841-3244 OT Time Calculation (min): 19 min  Charges: OT General Charges $OT Visit: 1 Visit OT Treatments $Self Care/Home Management : 8-22 mins  Lorre Munroe, OTR/L   Lorre Munroe 08/16/2020, 9:05 AM

## 2020-08-17 LAB — AEROBIC/ANAEROBIC CULTURE W GRAM STAIN (SURGICAL/DEEP WOUND): Gram Stain: NONE SEEN

## 2020-08-19 LAB — AEROBIC/ANAEROBIC CULTURE W GRAM STAIN (SURGICAL/DEEP WOUND): Gram Stain: NONE SEEN

## 2020-08-23 ENCOUNTER — Other Ambulatory Visit: Payer: Self-pay

## 2020-08-23 ENCOUNTER — Telehealth: Payer: Self-pay | Admitting: Podiatry

## 2020-08-23 ENCOUNTER — Ambulatory Visit (INDEPENDENT_AMBULATORY_CARE_PROVIDER_SITE_OTHER): Payer: No Typology Code available for payment source | Admitting: Podiatry

## 2020-08-23 DIAGNOSIS — L03119 Cellulitis of unspecified part of limb: Secondary | ICD-10-CM

## 2020-08-23 DIAGNOSIS — L02619 Cutaneous abscess of unspecified foot: Secondary | ICD-10-CM

## 2020-08-23 DIAGNOSIS — L97513 Non-pressure chronic ulcer of other part of right foot with necrosis of muscle: Secondary | ICD-10-CM

## 2020-08-23 DIAGNOSIS — M86171 Other acute osteomyelitis, right ankle and foot: Secondary | ICD-10-CM

## 2020-08-23 NOTE — Telephone Encounter (Signed)
Patient was instructed to get a dressing change midweek from home health that is already taking care of him and to call when the phone number so it could be set up. The patients wife found out that the home health treating [facility] only does infusions, they dont do wound care. He needs to come into the office for a dressing change due to the home health and insurance issues. When do you want the patient to come in for this dressing change? I will schedule upon your direction.

## 2020-08-26 NOTE — Progress Notes (Signed)
Subjective: Dean Rasmussen is a 69 y.o. is seen today in office s/p right foot incision and drainage, application of antibiotic beads the return to the operating room for wound closure, graft application which occurred on August 12, 2020 with Dr. Lilian Kapur.  He presents today for first follow-up visit.  He states he been doing well and has been nonweightbearing.  He is still on IV antibiotics and tolerating this well.  Has a follow-up scheduled with infectious disease in a couple of weeks. Denies any systemic complaints such as fevers, chills, nausea, vomiting. No calf pain, chest pain, shortness of breath.   Objective: General: No acute distress, AAOx3  DP/PT pulses palpable 2/4, CRT < 3 sec to all digits.  RIGHT foot: Incision is well coapted without any evidence of dehiscence with sutures intact.  Integra graft is also intact and appears incorporating.  There were some of the antibiotic beads, presents today which I removed.  Darkened erythema over the area the previous cellulitis but there is no warmth of the foot.  Minimal swelling to the foot.  There is no fluctuation or crepitation.  There is no malodor. No other areas of tenderness to bilateral lower extremities.  No other open lesions or pre-ulcerative lesions.  No pain with calf compression, swelling, warmth, erythema.   Assessment and Plan:  Status post right foot surgery, doing well with no complications   -Treatment options discussed including all alternatives, risks, and complications -Dressing was changed today.  Xeroform was applied to the graft site.  A small amount of antibiotic ointment was applied to the incision sites.  Keep the dressing clean, dry, intact.  We will try to see if home health can change the bandage next Wednesday otherwise I will come the office for dressing change. -Continue nonweightbearing -Antibiotics per infectious disease. -Ice/elevation -Pain medication as needed. -Monitor for any clinical signs or  symptoms of infection and DVT/PE and directed to call the office immediately should any occur or go to the ER. -Follow-up next week or sooner if any problems arise. In the meantime, encouraged to call the office with any questions, concerns, change in symptoms.   Ovid Curd, DPM

## 2020-08-26 NOTE — Telephone Encounter (Signed)
Were you able to schedule him for this Wednesday with me? Thanks!

## 2020-08-28 ENCOUNTER — Ambulatory Visit (INDEPENDENT_AMBULATORY_CARE_PROVIDER_SITE_OTHER): Payer: No Typology Code available for payment source | Admitting: Podiatry

## 2020-08-28 ENCOUNTER — Other Ambulatory Visit: Payer: Self-pay

## 2020-08-28 DIAGNOSIS — L03119 Cellulitis of unspecified part of limb: Secondary | ICD-10-CM

## 2020-08-28 DIAGNOSIS — L97513 Non-pressure chronic ulcer of other part of right foot with necrosis of muscle: Secondary | ICD-10-CM

## 2020-08-28 DIAGNOSIS — L02619 Cutaneous abscess of unspecified foot: Secondary | ICD-10-CM

## 2020-08-28 DIAGNOSIS — M86171 Other acute osteomyelitis, right ankle and foot: Secondary | ICD-10-CM

## 2020-08-28 NOTE — Progress Notes (Signed)
Subjective: Dean Rasmussen is a 69 y.o. is seen today in office s/p right foot incision and drainage, application of antibiotic beads the return to the operating room for wound closure, graft application which occurred on August 12, 2020 with Dr. Lilian Kapur. He presents today for dressing change as home health is not able to do this. Patient feeling well. He had 1 day of tenderness a couple nights ago after the sutures removed however that seems to have resolved. Overall he feels better. Denies any fevers, chills, nausea, vomiting. No calf pain, chest pain, shortness of breath.  Objective: General: No acute distress, AAOx3  DP/PT pulses palpable 2/4, CRT < 3 sec to all digits.  RIGHT foot: Incision is well coapted without any evidence of dehiscence with sutures intact.  Integra graft is also intact and appears incorporating. Incision plantar aspect of the foot intact with sutures. On the dorsal aspect of foot with the sutures removed a scab is present there is no drainage or pus identified and overall the color appears to be improved to the foot. There is no warmth of the foot. Minimal swelling. No fluctuation crepitation. No other areas of tenderness to bilateral lower extremities.  No other open lesions or pre-ulcerative lesions.  No pain with calf compression, swelling, warmth, erythema.   Assessment and Plan:  Status post right foot surgery, doing well with no complications   -Treatment options discussed including all alternatives, risks, and complications -Dressing was changed today. Adaptic was applied to the graft as well as the surgical sites. 4 x 4's, dry sterile dressing applied. Encourage elevation. Continue IV antibiotics. I will see him back on Friday possible removal sutures.  Vivi Barrack DPM

## 2020-08-29 DIAGNOSIS — I10 Essential (primary) hypertension: Secondary | ICD-10-CM | POA: Insufficient documentation

## 2020-08-29 DIAGNOSIS — E119 Type 2 diabetes mellitus without complications: Secondary | ICD-10-CM | POA: Insufficient documentation

## 2020-08-29 NOTE — Progress Notes (Signed)
Cardiology Office Note:    Date:  08/30/2020   ID:  Dean Rasmussen, DOB 10-Jan-1951, MRN [id number]  PCP:  Patient, [name]  Cardiologist:  Norman Herrlich, MD   Referring MD: [place], Eric J, DO  ASSESSMENT:    1. Persistent atrial fibrillation (HCC)   2. Hypertensive heart disease without heart failure   3. Type 2 diabetes mellitus without complication, without long-term current use of insulin (HCC)   4. Ventricular ectopy   5. Acquired hypothyroidism    PLAN:    In order of problems listed above:  1. Persistent atrial fibrillation onset with surgery acute medical illness he is rate controlled continue beta-blocker anticoagulant and will plan cardioversion when he finishes IV antibiotics.  If recurrence flecainide be appropriate.  At this time I don't see a clinical driver to do an ischemia evaluation 2. Labile BP at target continue current treatment beta-blocker 3. Stable managed by primary care on insulin and oral agent 4. Stable continue beta-blocker 5. Stable continue his current thyroid supplement  Next appointment in 2 weeks   Medication Adjustments/Labs and Tests Ordered: Current medicines are reviewed at length with the patient today.  Concerns regarding medicines are outlined above.  Orders Placed This Encounter  Procedures  . Ambulatory referral to Vascular Surgery  . EKG 12-Lead   No orders of the defined types were placed in this encounter.    Chief Complaint  Patient presents with  . Atrial Fibrillation    Noted during recent hospitalization with septic arthritis and surgical debridement    History of Present Illness:    Dean Rasmussen is a 69 y.o. male with insulin treated type 2 diabetes hypertension and hypothyroidism who is being seen today for the evaluation of atrial fibrillation at the request of [place], Alvira Philips, DO.  Records reviewed from care everywhere from [place], [facility] health cardiology relates that he has frequent ventricular and  supraventricular ectopy and hypertensive heart disease with left ventricular hypertrophy moderate none echocardiogram 2018.  Performed 07/29/2020 shows him to be in sinus rhythm frequent APCs and left ventricular voltage criteria.  There is a description of treadmill stress test performed in June 2018 - for ischemia and also utilized a cardiac rhythm monitor no report available.  He was admitted to the hospital with a foot ulcer and septic arthritis.  Surgical debridement and antibiotic bead placement followed by secondary wound closure.  Methicillin sensitive staph aureus.  She was noted to have intraoperative atrial fibrillation that verted spontaneously to sinus rhythm and was initiated on beta-blocker and anticoagulant.  EKG 08/13/2020 shows atrial fibrillation controlled ventricular rate otherwise normal radiogram performed 08/14/2020 shows severe concentric LVH normal ejection fraction both atrium were normal in size and no significant valvular abnormality.  TSH was assessed and normal  Chart review shows rhythm strips from September 14 15th 16th and 17th showing atrial fibrillation with a controlled ventricular rate he is initiated on anticoagulant therapy with Xarelto with a moderate stroke risk and a chads 2 score of 3.  His wife is an Charity fundraiser recently relocated from [place] participates in the evaluation decision making. He was unaware of atrial fibrillation She was aware that his atrial fibrillation was persistent not paroxysmal. He had a nosebleed in the hospital with aspirin and Eliquis and now on anticoagulant only has had no recurrence no clinical bleeding He continues on IV antibiotics till the middle of October. He is not having chest pain edema palpitations syncope or TIA. I recommended resuming sinus  rhythm after he finishes IV antibiotics with cardioversion [facility] options benefits and risks detailed he is compliant with his anticoagulant. Past Medical History:  Diagnosis  Date  . Abscess of right foot   . Atrial fibrillation ([place]) 08/14/2020  . Charcot's arthropathy 08/14/2020  . Diabetes mellitus without complication ([place])   . Diabetic neuropathy ([place]) 08/14/2020  . Essential hypertension 08/24/2019  . Hypertension   . Hypertensive left ventricular hypertrophy, without heart failure 08/24/2019  . Normocytic anemia 08/14/2020  . Open wound of right foot with complication   . Type 2 diabetes mellitus without complication, without long-term current use of insulin ([place]) 08/24/2019  . Ventricular ectopy 08/24/2019    Past Surgical History:  Procedure Laterality Date  . FOOT SURGERY Right 07/2020  . I & D EXTREMITY Right 08/09/2020   Procedure: IRRIGATION AND DEBRIDEMENT OF FOOT WITH IMPLANTATION OF ANTIBIOTIC CEMENT BEADS;  Surgeon: Criselda Peaches, DPM;  Location: [place];  Service: Podiatry;  Laterality: Right;  . IRRIGATION AND DEBRIDEMENT FOOT Right 08/09/2020   IRRIGATION AND DEBRIDEMENT OF FOOT WITH IMPLANTATION OF ANTIBIOTIC CEMENT BEADS (Right Foot)  . IRRIGATION AND DEBRIDEMENT FOOT Right 08/12/2020   Procedure: Secondary wound closure and removal of antibiotic beads;  Surgeon: Criselda Peaches, DPM;  Location: [place];  Service: Podiatry;  Laterality: Right;  . ROTATOR CUFF REPAIR      Current Medications: Current Meds  Medication Sig  . acetaminophen (TYLENOL) 500 MG tablet Take 1,000 mg by mouth every 6 (six) hours as needed (pain).  . ertapenem (INVANZ) IVPB Inject 1 g into the vein daily for 26 days. Indication: Diabetic foot infection  First Dose: Yes Last Day of Therapy:  09/10/20  Labs - Once weekly:  CBC/D and BMP, Labs - Every other week:  ESR and CRP Method of administration: Mini-Bag Plus / Gravity Method of administration may be changed at the discretion of home infusion pharmacist based upon assessment of the patient and/or caregiver's ability to self-administer the medication ordered.  Marland Kitchen glimepiride (AMARYL) 4 MG tablet Take 1 tablet (4 mg  total) by mouth daily with supper.  Marland Kitchen ibuprofen (ADVIL) 200 MG tablet Take 400 mg by mouth every 6 (six) hours as needed (pain).  . insulin lispro (HUMALOG) 100 UNIT/ML KwikPen Inject 2-10 Units into the skin 4 (four) times daily -  before meals and at bedtime. 2u BG 151-200, 4u BG 201-250, 6u BG 251-300, 8u BG 301-350, 10u BG 351-400  . Insulin Pen Needle (PEN NEEDLES 3/16") 31G X 5 MM MISC Use as directed with insulin pen  . levothyroxine (SYNTHROID) 25 MCG tablet Take 1 tablet (25 mcg total) by mouth daily before breakfast.  . metFORMIN (GLUCOPHAGE) 1000 MG tablet Take 2 tablets (2,000 mg total) by mouth daily with supper.  . metoprolol succinate (TOPROL-XL) 100 MG 24 hr tablet Take 1 tablet (100 mg total) by mouth daily.  . minocycline (MINOCIN) 100 MG capsule Take 100 mg by mouth See admin instructions. At onset of rosacea flare take one capsule (100 mg) twice daily for a few days, then take one capsule (100 mg) daily for a few days, then take one capsule (100 mg) every other day until clear. Resume when rosacea flares back up.  . rivaroxaban (XARELTO) 20 MG TABS tablet Take 1 tablet (20 mg total) by mouth daily with supper.  . sitaGLIPtin (JANUVIA) 100 MG tablet Take 1 tablet (100 mg total) by mouth daily with supper.     Allergies:  Patient has no known allergies.   Social History   Socioeconomic History  . Marital status: Married    Spouse name: Not on file  . Number of children: Not on file  . Years of education: Not on file  . Highest education level: Not on file  Occupational History  . Not on file  Tobacco Use  . Smoking status: Never Smoker  . Smokeless tobacco: Never Used  Vaping Use  . Vaping Use: Never used  Substance and Sexual Activity  . Alcohol use: Yes    Comment: occasional  . Drug use: Never  . Sexual activity: Not on file  Other Topics Concern  . Not on file  Social History Narrative  . Not on file   Social Determinants of Health   Financial Resource  Strain:   . Difficulty of Paying Living Expenses: Not on file  Food Insecurity:   . Worried About Charity fundraiser in the Last Year: Not on file  . Ran Out of Food in the Last Year: Not on file  Transportation Needs:   . Lack of Transportation (Medical): Not on file  . Lack of Transportation (Non-Medical): Not on file  Physical Activity:   . Days of Exercise per Week: Not on file  . Minutes of Exercise per Session: Not on file  Stress:   . Feeling of Stress : Not on file  Social Connections:   . Frequency of Communication with Friends and Family: Not on file  . Frequency of Social Gatherings with Friends and Family: Not on file  . Attends Religious Services: Not on file  . Active Member of Clubs or Organizations: Not on file  . Attends Archivist Meetings: Not on file  . Marital Status: Not on file     Family History: The patient's family history includes Atrial fibrillation in his mother; Bladder Cancer in his father; CAD in his brother; Diabetes in his brother and mother; Heart attack in his brother; Heart disease in his mother; Other in his sister.  ROS:   ROS Please see the history of present illness.     All other systems reviewed and are negative.  EKGs/Labs/Other Studies Reviewed:    The following studies were reviewed today:   EKG:  EKG is  ordered today.  The ekg ordered today is personally reviewed and demonstrates rate controlled atrial fibrillation  Recent Labs: 08/09/2020: TSH 0.892 08/10/2020: ALT 16 08/16/2020: BUN 15; Creatinine, Ser 1.08; Hemoglobin 9.8; Magnesium 1.8; Platelets 214; Potassium 4.1; Sodium 137  Recent Lipid Panel No results found for: CHOL, TRIG, HDL, CHOLHDL, VLDL, LDLCALC, LDLDIRECT  Physical Exam:    VS:  BP 138/62   Pulse 83   Ht $R'5\' 9"'NK$  (1.753 m)   Wt 173 lb 9.6 oz (78.7 kg)   SpO2 99%   BMI 25.64 kg/m     Wt Readings from Last 3 Encounters:  08/30/20 173 lb 9.6 oz (78.7 kg)  08/09/20 180 lb (81.6 kg)     GEN:   Well nourished, well developed in no acute distress HEENT: Normal NECK: No JVD; No carotid bruits LYMPHATICS: No lymphadenopathy CARDIAC: Variable S1 irregular rhythm no murmurs, rubs, gallops RESPIRATORY:  Clear to auscultation without rales, wheezing or rhonchi  ABDOMEN: Soft, non-tender, non-distended MUSCULOSKELETAL:  No edema; No deformity  SKIN: Warm and dry NEUROLOGIC:  Alert and oriented x 3 PSYCHIATRIC:  Normal affect     Signed, Shirlee More, MD  08/30/2020 10:04 AM    [facility]

## 2020-08-30 ENCOUNTER — Ambulatory Visit (INDEPENDENT_AMBULATORY_CARE_PROVIDER_SITE_OTHER): Payer: No Typology Code available for payment source | Admitting: Cardiology

## 2020-08-30 ENCOUNTER — Encounter: Payer: Self-pay | Admitting: Cardiology

## 2020-08-30 ENCOUNTER — Other Ambulatory Visit: Payer: Self-pay

## 2020-08-30 ENCOUNTER — Ambulatory Visit (INDEPENDENT_AMBULATORY_CARE_PROVIDER_SITE_OTHER): Payer: No Typology Code available for payment source | Admitting: Podiatry

## 2020-08-30 VITALS — BP 138/62 | HR 83 | Ht 69.0 in | Wt 173.6 lb

## 2020-08-30 DIAGNOSIS — I4819 Other persistent atrial fibrillation: Secondary | ICD-10-CM | POA: Diagnosis not present

## 2020-08-30 DIAGNOSIS — E039 Hypothyroidism, unspecified: Secondary | ICD-10-CM

## 2020-08-30 DIAGNOSIS — E119 Type 2 diabetes mellitus without complications: Secondary | ICD-10-CM

## 2020-08-30 DIAGNOSIS — I119 Hypertensive heart disease without heart failure: Secondary | ICD-10-CM | POA: Diagnosis not present

## 2020-08-30 DIAGNOSIS — L02619 Cutaneous abscess of unspecified foot: Secondary | ICD-10-CM

## 2020-08-30 DIAGNOSIS — L97513 Non-pressure chronic ulcer of other part of right foot with necrosis of muscle: Secondary | ICD-10-CM

## 2020-08-30 DIAGNOSIS — M86171 Other acute osteomyelitis, right ankle and foot: Secondary | ICD-10-CM

## 2020-08-30 DIAGNOSIS — L03119 Cellulitis of unspecified part of limb: Secondary | ICD-10-CM

## 2020-08-30 DIAGNOSIS — I493 Ventricular premature depolarization: Secondary | ICD-10-CM

## 2020-08-30 NOTE — Patient Instructions (Signed)
Medication Instructions:  Your physician recommends that you continue on your current medications as directed. Please refer to the Current Medication list given to you today.  *If you need a refill on your cardiac medications before your next appointment, please call your pharmacy*   Lab Work: None If you have labs (blood work) drawn today and your tests are completely normal, you will receive your results only by: Marland Kitchen MyChart Message (if you have MyChart) OR . A paper copy in the mail If you have any lab test that is abnormal or we need to change your treatment, we will call you to review the results.   Testing/Procedures: None   Follow-Up: At [facility], you and your health needs are our priority.  As part of our continuing mission to provide you with exceptional heart care, we have created designated Provider Care Teams.  These Care Teams include your primary Cardiologist (physician) and Advanced Practice Providers (APPs -  Physician Assistants and Nurse Practitioners) who all work together to provide you with the care you need, when you need it.  We recommend signing up for the patient portal called "MyChart".  Sign up information is provided on this After Visit Summary.  MyChart is used to connect with patients for Virtual Visits (Telemedicine).  Patients are able to view lab/test results, encounter notes, upcoming appointments, etc.  Non-urgent messages can be sent to your provider as well.   To learn more about what you can do with MyChart, go to [email or web address].    Your next appointment:    2-3 week(s)  The format for your next appointment:   In Person  Provider:   Norman Herrlich, MD   Other Instructions We have put in a referral for you to see the Vacular Surgeon- Dr. Darrick Penna. They will call you to schedule this appoitnment.

## 2020-09-03 NOTE — Progress Notes (Signed)
Subjective: Dean Rasmussen is a 69 y.o. is seen today in office s/p right foot incision and drainage, application of antibiotic beads the return to the operating room for wound closure, graft application which occurred on August 12, 2020 with Dr. Lilian Kapur. She presents today for dressing change. States he has been doing well since I last saw him on Wednesday and he has no new concerns today. Currently denies any fevers, chills, nausea, vomiting. No calf pain, chest pain, shortness of breath.  Objective: General: No acute distress, AAOx3  DP/PT pulses palpable 2/4, CRT < 3 sec to all digits.  RIGHT foot: Incision is well coapted without any evidence of dehiscence with sutures intact.  Integra graft is also intact and appears incorporating. Incision plantar aspect of the foot intact with sutures. There is no increase in warmth of the foot there is mild swelling. Some dark discoloration from a resolving infection. There is no areas of fluctuation crepitation peer there is no malodor. No other open lesions or pre-ulcerative lesions.  No pain with calf compression, swelling, warmth, erythema.   Assessment and Plan:  Status post right foot surgery, doing well with no complications   -Treatment options discussed including all alternatives, risks, and complications -Dressing was changed today. Adaptic was applied to the graft as well as the surgical sites. 4 x 4's, dry sterile dressing applied. Encourage elevation. Continue IV antibiotics. I will see him back next week to remove the staples as well as sutures.  Vivi Barrack DPM

## 2020-09-04 ENCOUNTER — Inpatient Hospital Stay: Payer: No Typology Code available for payment source | Admitting: Family Medicine

## 2020-09-05 ENCOUNTER — Encounter: Payer: Self-pay | Admitting: Rehabilitative and Restorative Service Providers"

## 2020-09-05 ENCOUNTER — Other Ambulatory Visit: Payer: Self-pay

## 2020-09-05 ENCOUNTER — Ambulatory Visit (INDEPENDENT_AMBULATORY_CARE_PROVIDER_SITE_OTHER): Payer: No Typology Code available for payment source | Admitting: Rehabilitative and Restorative Service Providers"

## 2020-09-05 DIAGNOSIS — R29898 Other symptoms and signs involving the musculoskeletal system: Secondary | ICD-10-CM | POA: Diagnosis not present

## 2020-09-05 DIAGNOSIS — R2689 Other abnormalities of gait and mobility: Secondary | ICD-10-CM

## 2020-09-05 DIAGNOSIS — M6281 Muscle weakness (generalized): Secondary | ICD-10-CM | POA: Diagnosis not present

## 2020-09-05 NOTE — Patient Instructions (Signed)
Access Code: [email or web address] URL: [email or web address] Date: 09/05/2020 Prepared by: Margretta Ditty  Exercises Supine Active Straight Leg Raise - 2 x daily - 7 x weekly - 1 sets - 10 reps Sidelying Hip Abduction - 2 x daily - 7 x weekly - 1 sets - 10 reps Seated March - 2 x daily - 7 x weekly - 1 sets - 10 reps Seated Long Arc Quad - 2 x daily - 7 x weekly - 1 sets - 10 reps

## 2020-09-05 NOTE — Therapy (Addendum)
[facility] [address] [place], [place], [address] Phone: [phone]   Fax:  [phone]  Physical Therapy Evaluation/ Discharge Summary  Patient Details  Name: Dean Rasmussen MRN: [id number] Date of Birth: [birth year] Referring Provider (PT): Referred by Eric Uz[place], DO ; currently followed by Ovid Curd, MD   Encounter Date: 09/05/2020   PT End of Session - 09/05/20 1805    Visit Number 1    Number of Visits 12    Date for PT Re-Evaluation 10/17/20    PT Start Time 1710    PT Stop Time 1750    PT Time Calculation (min) 40 min    Activity Tolerance Patient tolerated treatment well    Behavior During Therapy [facility] for tasks assessed/performed           Past Medical History:  Diagnosis Date  . Abscess of right foot   . Atrial fibrillation (HCC) 08/14/2020  . Charcot's arthropathy 08/14/2020  . Diabetes mellitus without complication (HCC)   . Diabetic neuropathy (HCC) 08/14/2020  . Essential hypertension 08/24/2019  . Hypertension   . Hypertensive left ventricular hypertrophy, without heart failure 08/24/2019  . Normocytic anemia 08/14/2020  . Open wound of right foot with complication   . Type 2 diabetes mellitus without complication, without long-term current use of insulin (HCC) 08/24/2019  . Ventricular ectopy 08/24/2019    Past Surgical History:  Procedure Laterality Date  . FOOT SURGERY Right 07/2020  . I & D EXTREMITY Right 08/09/2020   Procedure: IRRIGATION AND DEBRIDEMENT OF FOOT WITH IMPLANTATION OF ANTIBIOTIC CEMENT BEADS;  Surgeon: Edwin Cap, DPM;  Location: [facility] OR;  Service: Podiatry;  Laterality: Right;  . IRRIGATION AND DEBRIDEMENT FOOT Right 08/09/2020   IRRIGATION AND DEBRIDEMENT OF FOOT WITH IMPLANTATION OF ANTIBIOTIC CEMENT BEADS (Right Foot)  . IRRIGATION AND DEBRIDEMENT FOOT Right 08/12/2020   Procedure: Secondary wound closure and removal of antibiotic beads;  Surgeon: Edwin Cap,  DPM;  Location: [facility] OR;  Service: Podiatry;  Laterality: Right;  . ROTATOR CUFF REPAIR      There were no vitals filed for this visit.    Subjective Assessment - 09/05/20 1715    Subjective The patient reports he had a fallen arch in the R foot (charcot foot R ) and things worsened in June 2021.  He developed an abscess on his R foot with osteomyelitis.  He is currently NWB and using a knee scooter for mobility.  He is getting IV antibiotics at home.    Pertinent History diabetes, HTN, L shoulder significantly impaired ROM (notes a torn RTC tear x 2 years), back pain, h/o R RTC surgery    Patient Stated Goals gain muscle strength, be able to walk    Currently in Pain? Yes    Pain Score 2     Pain Location Foot    Pain Orientation Right    Pain Descriptors / Indicators Aching;Sore    Pain Type Surgical pain    Pain Onset 1 to 4 weeks ago    Pain Frequency Intermittent    Aggravating Factors  specific movements              [facility] PT Assessment - 09/05/20 1723      Assessment   Medical Diagnosis osteomyelitis of the R foot    Referring Provider (PT) Referred by Eric U[place], DO ; currently followed by Ovid Curd, MD    Onset Date/Surgical Date 08/09/20    Hand Dominance Right  Prior Therapy none      Precautions   Precautions Fall    Precaution Comments using a knee scooter      Restrictions   Weight Bearing Restrictions Yes    RLE Weight Bearing Non weight bearing      Balance Screen   Has the patient fallen in the past 6 months No    Has the patient had a decrease in activity level because of a fear of falling?  Yes   due to recent surgery   Is the patient reluctant to leave their home because of a fear of falling?  No      Home Environment   Living Environment Private residence    Living Arrangements Spouse/significant other    Type of Home Apartment    Home Access Level entry    Home Layout One level    Home Equipment --   knee scooter   Additional  Comments is trying to move from [place] to [place]-- needs to get home to clean out old house with help      Prior Function   Level of Independence Independent      Observation/Other Assessments   Focus on Therapeutic Outcomes (FOTO)  79% limitation      Sensation   Light Touch Appears Intact      ROM / Strength   AROM / PROM / Strength AROM;Strength      AROM   Overall AROM  Deficits    Overall AROM Comments Mild limitation R shoulder (h/o R RTC surgery early 2000s), severe limitations L shoulder with no AROM of shoulder.  He uses R UE to lift L UE.  He can use L elbow/wrist for functional tasks and has full AROM L elbow.    AROM Assessment Site Ankle    Right/Left Ankle Right    Right Ankle Dorsiflexion --   patient demonstrates minimal AROM R foot in NWB position     Strength   Overall Strength Comments 4-/5 R UE for shoulder flexion, abduction;  L shoulder has 1/5 strength for flexion, abduction.      Strength Assessment Site Hip;Knee;Ankle    Right/Left Hip Right;Left    Right Hip Flexion 3+/5    Left Hip Flexion 4/5    Right/Left Knee Right;Left    Right Knee Flexion 4-/5    Right Knee Extension 4-/5    Left Knee Flexion 4-/5    Left Knee Extension 4-/5    Right/Left Ankle Right;Left    Left Ankle Dorsiflexion 4/5      Bed Mobility   Bed Mobility Rolling Left;Supine to Sit;Sit to Supine    Rolling Left Supervision/Verbal cueing;Independent    Supine to Sit Independent    Sit to Supine Independent      Transfers   Transfers Sit to Stand;Stand to Sit    Sit to Stand 6: Modified independent (Device/Increase time)    Stand to Sit 6: Modified independent (Device/Increase time)    Comments patient transfers to knee scooter and needs cues to use wheel lock for safety      Ambulation/Gait   Ambulation/Gait Yes    Ambulation/Gait Assistance 5: Supervision    Ambulation/Gait Assistance Details patient uses knee scooter to maintain NWB status R LE.  He hits the back wheel with his  L foot frequently during gait    Ambulation Surface Level;Indoor                      Objective  measurements completed on examination: See above findings.       [facility] Adult PT Treatment/Exercise - 09/05/20 1723      Exercises   Exercises Knee/Hip;Ankle      Knee/Hip Exercises: Seated   Long Arc Quad Strengthening;Right;Left;10 reps    Marching Strengthening;Right;Left;10 reps      Knee/Hip Exercises: Supine   Straight Leg Raises Strengthening;Right;Left;10 reps      Knee/Hip Exercises: Sidelying   Hip ABduction Strengthening;Right;Left;10 reps                  PT Education - 09/05/20 1744    Education Details HEP    Person(s) Educated Patient    Methods Explanation;Demonstration;Handout    Comprehension Returned demonstration;Verbalized understanding               PT Long Term Goals - 09/05/20 1813      PT LONG TERM GOAL #1   Title The patient will be indep with HEP.    Time 6    Period Weeks    Target Date 10/17/20      PT LONG TERM GOAL #2   Title The patient will reduce functional limitation per FOTO from 79% to < or equal to 45%.    Time 6    Period Weeks    Target Date 10/17/20      PT LONG TERM GOAL #3   Title The patient will improve bilateral hip flexor strength and knee flexion/extension strength to 5/5.    Time 6    Period Weeks    Target Date 10/17/20      PT LONG TERM GOAL #4   Title The patient will progress to household ambulation mod indep as allowed per WB status.    Time 6    Period Weeks    Target Date 10/17/20      PT LONG TERM GOAL #5   Title The patient's balance will be further assessed once able to WB And LTG to follow.    Time 6    Period Weeks    Target Date 10/17/20                  Plan - 09/05/20 1827    Clinical Impression Statement The patient is a 69 yo male presenting to physical therapy after recent hospitalization due to surgical debridement of R foot abscess and osteomyelitis.  He  presents today with h/o multiple medical issues including HTN, diabetes.  He has impairments including:  dec'd L shoulder AROM, dec'd strength throughout bilat UEs/LEs, gait instability, back pain, foot pain, deconditioning.  Impairments limit participation in ADLs, IADLs, and community mobility.    Personal Factors and Comorbidities Comorbidity 3+    Comorbidities HTN, diabetes, h/o bilat shoulder injuries (L without functional use)    Examination-Activity Limitations Locomotion Level;Bathing;Stairs;Stand;Lift;Squat;Dressing    Examination-Participation Restrictions Meal Prep;Cleaning;Community Activity;Driving;Interpersonal Relationship    Stability/Clinical Decision Making Stable/Uncomplicated    Clinical Decision Making Low    Rehab Potential Good    PT Frequency 2x / week    PT Duration 6 weeks    PT Treatment/Interventions ADLs/Self Care Home Management;Gait training;Stair training;Functional mobility training;Therapeutic activities;Therapeutic exercise;Balance training;Neuromuscular re-education;Patient/family education;Manual techniques;Taping;Orthotic Fit/Training;DME Instruction;Passive range of motion    PT Next Visit Plan progress HEP (NWB) for LE strength, work on posture and general strengthening to improve mobility, consider establishing HEP and then holding until able to progress to WB.    PT Home Exercise Plan Access Code: [email or web address]    [email or web address]  and Agree with Plan of Care Patient           Patient will benefit from skilled therapeutic intervention in order to improve the following deficits and impairments:  Abnormal gait, Decreased range of motion, Decreased strength, Decreased balance, Decreased activity tolerance, Pain, Impaired flexibility, Postural dysfunction  Visit Diagnosis: Other abnormalities of gait and mobility  Muscle weakness (generalized)  Other symptoms and signs involving the musculoskeletal system     Problem List Patient Active Problem List    Diagnosis Date Noted  . Hypertension   . Diabetes mellitus without complication ([place])   . Diabetic neuropathy ([place]) 08/14/2020  . Charcot's arthropathy 08/14/2020  . Normocytic anemia 08/14/2020  . Atrial fibrillation ([place]) 08/14/2020  . Open wound of right foot with complication   . Abscess of right foot   . Hypertensive heart disease 08/24/2019  . Hypertensive left ventricular hypertrophy, without heart failure 08/24/2019  . Type 2 diabetes mellitus without complication, without long-term current use of insulin ([place]) 08/24/2019  . Ventricular ectopy 08/24/2019   PHYSICAL THERAPY DISCHARGE SUMMARY  Visits from Start of Care: eval only  Current functional level related to goals / functional outcomes: See above *did not return   Remaining deficits: See above   Education / Equipment: HEP  Plan: Patient agrees to discharge.  Patient goals were not met. Patient is being discharged due to not returning since the last visit.  ?????          Deborha Moseley , PT 09/05/2020, 6:31 PM  [facility] [address] [place], [place], [address] Phone: [phone]   Fax:  [phone]  Name: Dean Rasmussen MRN: [id number] Date of Birth: 1951/06/09

## 2020-09-06 ENCOUNTER — Other Ambulatory Visit: Payer: Self-pay | Admitting: Podiatry

## 2020-09-06 ENCOUNTER — Ambulatory Visit (INDEPENDENT_AMBULATORY_CARE_PROVIDER_SITE_OTHER): Payer: No Typology Code available for payment source | Admitting: Podiatry

## 2020-09-06 ENCOUNTER — Ambulatory Visit (INDEPENDENT_AMBULATORY_CARE_PROVIDER_SITE_OTHER): Payer: No Typology Code available for payment source

## 2020-09-06 DIAGNOSIS — M86071 Acute hematogenous osteomyelitis, right ankle and foot: Secondary | ICD-10-CM

## 2020-09-06 DIAGNOSIS — L03119 Cellulitis of unspecified part of limb: Secondary | ICD-10-CM

## 2020-09-06 DIAGNOSIS — M86072 Acute hematogenous osteomyelitis, left ankle and foot: Secondary | ICD-10-CM

## 2020-09-06 DIAGNOSIS — M86171 Other acute osteomyelitis, right ankle and foot: Secondary | ICD-10-CM

## 2020-09-06 DIAGNOSIS — L97513 Non-pressure chronic ulcer of other part of right foot with necrosis of muscle: Secondary | ICD-10-CM

## 2020-09-06 DIAGNOSIS — L02619 Cutaneous abscess of unspecified foot: Secondary | ICD-10-CM

## 2020-09-06 NOTE — Progress Notes (Signed)
Subjective: Dean Rasmussen is a 69 y.o. is seen today in office s/p right foot incision and drainage, application of antibiotic beads the return to the operating room for wound closure, graft application which occurred on August 12, 2020 with Dr. Lilian Kapur.  He presents today for suture, staple removal.  States he been doing well he is on antibiotics.  He follows with infectious disease next week.  Currently denies any fevers, chills, nausea, vomiting.  No calf pain, chest pain, shortness of breath.  Objective: General: No acute distress, AAOx3  DP/PT pulses palpable 2/4, CRT < 3 sec to all digits.  RIGHT foot: Incision is well coapted without any evidence of dehiscence with sutures intact.  Integra graft is also intact and appears incorporating.  1 area with antibiotic beads were still present which I removed some today.  There is no purulence.  On the dorsal aspect of the foot a small scab for the incision was made previously.  There is minimal edema.  There is no significant erythema warmth.  No fluctuation or crepitation. No pain with calf compression, swelling, warmth, erythema.         Assessment and Plan:  Status post right foot surgery  -Treatment options discussed including all alternatives, risks, and complications -Today remove the sutures and staples.  Remove the silicone layer from the Integra graft.  For now.  Followed by dry sterile dressing daily.  I did lightly debride the periphery of the wound as well.  He will follow-up next week with Dr. Lilian Kapur and if graft is needed we will schedule him for likely the following week for further graft. -Continue nonweightbearing, elevation -X-rays ordered today -Follow up with infectious disease next week. -140 over the other day dressing changes.  Vivi Barrack DPM

## 2020-09-09 NOTE — Progress Notes (Signed)
Yes I will. I had put CaSO4 beads in, some of that may be resorptive from that. I also didn't take x-rays after the 2nd surgery after I had burred them down so it may just be post resection changes since the first case

## 2020-09-10 ENCOUNTER — Ambulatory Visit (INDEPENDENT_AMBULATORY_CARE_PROVIDER_SITE_OTHER): Payer: No Typology Code available for payment source | Admitting: Internal Medicine

## 2020-09-10 ENCOUNTER — Encounter: Payer: Self-pay | Admitting: Internal Medicine

## 2020-09-10 ENCOUNTER — Telehealth: Payer: Self-pay

## 2020-09-10 ENCOUNTER — Other Ambulatory Visit: Payer: Self-pay

## 2020-09-10 DIAGNOSIS — L02611 Cutaneous abscess of right foot: Secondary | ICD-10-CM

## 2020-09-10 MED ORDER — AMOXICILLIN-POT CLAVULANATE 875-125 MG PO TABS: 1.0000 | ORAL_TABLET | Freq: Two times a day (BID) | ORAL | 0 refills | Status: DC

## 2020-09-10 NOTE — Telephone Encounter (Signed)
Spoke with Tim at [facility] to relay verbal orders per Dr. Orvan Falconer that okay to pull PICC. Orders repeated and verified.   Sandie Ano, RN

## 2020-09-10 NOTE — Progress Notes (Signed)
[facility] for Infectious Disease  Patient Active Problem List   Diagnosis Date Noted  . Open wound of right foot with complication     Priority: High  . Abscess of right foot     Priority: High  . Hypertension   . Diabetes mellitus without complication ([place])   . Diabetic neuropathy ([place]) 08/14/2020  . Charcot's arthropathy 08/14/2020  . Normocytic anemia 08/14/2020  . Atrial fibrillation ([place]) 08/14/2020  . Hypertensive heart disease 08/24/2019  . Hypertensive left ventricular hypertrophy, without heart failure 08/24/2019  . Type 2 diabetes mellitus without complication, without long-term current use of insulin ([place]) 08/24/2019  . Ventricular ectopy 08/24/2019    Patient's Medications  New Prescriptions   AMOXICILLIN-CLAVULANATE (AUGMENTIN) 875-125 MG TABLET    Take 1 tablet by mouth 2 (two) times daily.  Previous Medications   ACETAMINOPHEN (TYLENOL) 500 MG TABLET    Take 1,000 mg by mouth every 6 (six) hours as needed (pain).   ASPIRIN 81 MG EC TABLET    Take 81 mg by mouth daily with supper.    GLIMEPIRIDE (AMARYL) 4 MG TABLET    Take 1 tablet (4 mg total) by mouth daily with supper.   HYDROCODONE-ACETAMINOPHEN (NORCO/VICODIN) 5-325 MG TABLET    Take 1-2 tablets by mouth every 4 (four) hours as needed for moderate pain.   IBUPROFEN (ADVIL) 200 MG TABLET    Take 400 mg by mouth every 6 (six) hours as needed (pain).   INSULIN LISPRO (HUMALOG) 100 UNIT/ML KWIKPEN    Inject 2-10 Units into the skin 4 (four) times daily -  before meals and at bedtime. 2u BG 151-200, 4u BG 201-250, 6u BG 251-300, 8u BG 301-350, 10u BG 351-400   INSULIN PEN NEEDLE (PEN NEEDLES 3/16") 31G X 5 MM MISC    Use as directed with insulin pen   LEVOTHYROXINE (SYNTHROID) 25 MCG TABLET    Take 1 tablet (25 mcg total) by mouth daily before breakfast.   MAGNESIUM PO    Take 2 tablets by mouth at bedtime.   METFORMIN (GLUCOPHAGE) 1000 MG TABLET    Take 2 tablets (2,000 mg total) by mouth daily with  supper.   METOPROLOL SUCCINATE (TOPROL-XL) 100 MG 24 HR TABLET    Take 1 tablet (100 mg total) by mouth daily.   MINOCYCLINE (MINOCIN) 100 MG CAPSULE    Take 100 mg by mouth See admin instructions. At onset of rosacea flare take one capsule (100 mg) twice daily for a few days, then take one capsule (100 mg) daily for a few days, then take one capsule (100 mg) every other day until clear. Resume when rosacea flares back up.   NOVOLOG FLEXPEN 100 UNIT/ML FLEXPEN    Inject into the skin.   RIVAROXABAN (XARELTO) 20 MG TABS TABLET    Take 1 tablet (20 mg total) by mouth daily with supper.   SITAGLIPTIN (JANUVIA) 100 MG TABLET    Take 1 tablet (100 mg total) by mouth daily with supper.   SODIUM CHLORIDE 0.9 % INFUSION    sodium chloride 0.9 % intravenous solution  Infuse by intravenous route.  Modified Medications   No medications on file  Discontinued Medications   ERTAPENEM (INVANZ) IVPB    Inject 1 g into the vein daily for 26 days. Indication: Diabetic foot infection  First Dose: Yes Last Day of Therapy:  09/10/20  Labs - Once weekly:  CBC/D and BMP, Labs - Every other week:  ESR and CRP Method of administration: Mini-Bag Plus / Gravity Method of administration may be changed at the discretion of home infusion pharmacist based upon assessment of the patient and/or caregiver's ability to self-administer the medication ordered.    Subjective: Dean Rasmussen is in with his wife, Dean Rasmussen, for his hospital follow-up visit.  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  He and his wife recently moved here from [place].  They have only been here for 1 week. They have been staying in a hotel while there furniture is moved into their apartment.  She recently noticed something on his right foot.  He has been hiding from her for several months brought in to the emergency department on 08/07/2020.  He told the ED staff there that for several months he has had an  enlarging nodule on the medial plantar aspect of his right foot.  It has become more painful recently that about 2 months ago it ulcerated and started to ooze malodorous fluid.  He was try to keep it clean and covered so she would not learn until after they have made their move.  He was referred from the ED to Dr. Earleen Newport who performed an MRI which showed a large abscess on the plantar surface of his foot and question early changes of septic arthritis in the first TMT joint and possible osteomyelitis in the first metatarsal base and medial cuneiform bone.  He was admitted on 08/09/2020 and underwent surgery where the large abscess was drained and debrided.  The areas of questionable pretty healthy.  A bone biopsy showed benign bone on pathology review.  Abscess and bone cultures are growing MSSA, strep anginosis and Bacteroides fragilis.  He went back to the OR for further debridement and wound closure on 08/12/2020.  He is a never smoker.  His hemoglobin A1c is 9.  His wife is a Field seismologist with ICU and oncology experience.  She is currently working for [employer].  A PICC was placed and he was discharged on IV ertapenem.  He has now completed 4 weeks of antibiotic therapy.  He has not had any problems tolerating his PICC or ertapenem.  They feel like his wound is improving.  He has been working very hard to control his blood sugars.  Review of Systems: Review of Systems  Constitutional: Negative for chills, diaphoresis and fever.  Respiratory: Negative for cough.   Cardiovascular: Negative for chest pain and palpitations.  Gastrointestinal: Negative for abdominal pain, diarrhea, nausea and vomiting.  Musculoskeletal: Negative for joint pain.    Past Medical History:  Diagnosis Date  . Abscess of right foot   . Atrial fibrillation ([place]) 08/14/2020  . Charcot's arthropathy 08/14/2020  . Diabetes mellitus without complication ([place])   . Diabetic neuropathy ([place]) 08/14/2020  . Essential hypertension  08/24/2019  . Hypertension   . Hypertensive left ventricular hypertrophy, without heart failure 08/24/2019  . Normocytic anemia 08/14/2020  . Open wound of right foot with complication   . Type 2 diabetes mellitus without complication, without long-term current use of insulin ([place]) 08/24/2019  . Ventricular ectopy 08/24/2019    Social History   Tobacco Use  . Smoking status: Never Smoker  . Smokeless tobacco: Never Used  Vaping Use  . Vaping Use: Never used  Substance Use Topics  . Alcohol use: Yes    Comment: occasional  . Drug use: Never    Family History  Problem Relation  Age of Onset  . Heart disease Mother   . Atrial fibrillation Mother   . Diabetes Mother   . Bladder Cancer Father   . Other Sister        No known health problems  . Heart attack Brother   . CAD Brother   . Diabetes Brother     No Known Allergies  Objective: Vitals:   09/10/20 1407  BP: 127/82  Pulse: 87  Temp: (!) 97.5 F (36.4 C)  TempSrc: Oral  SpO2: 97%   There is no height or weight on file to calculate BMI.  Physical Exam Constitutional:      Comments: He is in good spirits.  He is seated in his wheelchair.  Cardiovascular:     Rate and Rhythm: Normal rate. Rhythm irregular.     Heart sounds: No murmur heard.   Pulmonary:     Effort: Pulmonary effort is normal.     Breath sounds: Normal breath sounds.  Musculoskeletal:     Comments: The Charcot deformity of his right foot is unchanged.  He has a large medial, superficial wound.  There is some bloody drainage.  There is no malodor, surrounding erythema or fluctuance.  Skin:    Findings: No rash.     Comments: His right arm PICC site looks good.  Psychiatric:        Mood and Affect: Mood normal.         Problem List Items Addressed This Visit      High   Abscess of right foot    He appears to be improving on therapy for his polymicrobial abscess and possible early bone and joint infection.  I discussed management options  with them.  We have agreed upon PICC removal and changing ertapenem to oral amoxicillin clavulanate for 2 more weeks.  He will follow-up here in 4 weeks.      Relevant Medications   amoxicillin-clavulanate (AUGMENTIN) 875-125 MG tablet       Michel Bickers, MD [facility] for [place] [phone] pager   [phone] cell 09/10/2020, 2:28 PM

## 2020-09-10 NOTE — Assessment & Plan Note (Signed)
He appears to be improving on therapy for his polymicrobial abscess and possible early bone and joint infection.  I discussed management options with them.  We have agreed upon PICC removal and changing ertapenem to oral amoxicillin clavulanate for 2 more weeks.  He will follow-up here in 4 weeks.

## 2020-09-11 ENCOUNTER — Other Ambulatory Visit: Payer: Self-pay

## 2020-09-11 ENCOUNTER — Other Ambulatory Visit: Payer: Self-pay | Admitting: Cardiology

## 2020-09-11 MED ORDER — RIVAROXABAN 20 MG PO TABS: 20.0000 mg | ORAL_TABLET | Freq: Every day | ORAL | 2 refills | Status: DC

## 2020-09-11 NOTE — Telephone Encounter (Signed)
*  STAT* If patient is at the pharmacy, call can be transferred to refill team.   1. Which medications need to be refilled? (please list name of each medication and dose if known) Xarelto  2. Which pharmacy/location (including street and city if local pharmacy) is medication to be sent to?[facility] [address] [place]  3. Do they need a 30 day or 90 day supply? 60 days and refills

## 2020-09-11 NOTE — Telephone Encounter (Signed)
Rx sent to the pharmacy.

## 2020-09-12 ENCOUNTER — Telehealth: Payer: Self-pay | Admitting: Cardiology

## 2020-09-12 NOTE — Telephone Encounter (Signed)
Yes, I would hold the Xarelto for 1 day prior to the removal.  Who is going to remove this I hope it is a home health aide or visiting nurse.

## 2020-09-12 NOTE — Telephone Encounter (Signed)
Patient has a picc line he wants to know if it is safe for him to have it removed at home when his is on Xarelto.

## 2020-09-12 NOTE — Telephone Encounter (Signed)
Spoke to the patient just now and let him know Dr. Hulen Shouts recommendation. They verbalize understanding and thank me for the call back.    Encouraged patient to call back with any questions or concerns.

## 2020-09-13 ENCOUNTER — Other Ambulatory Visit: Payer: Self-pay

## 2020-09-13 ENCOUNTER — Ambulatory Visit (INDEPENDENT_AMBULATORY_CARE_PROVIDER_SITE_OTHER): Payer: No Typology Code available for payment source | Admitting: Podiatry

## 2020-09-13 DIAGNOSIS — L03119 Cellulitis of unspecified part of limb: Secondary | ICD-10-CM

## 2020-09-13 DIAGNOSIS — L02619 Cutaneous abscess of unspecified foot: Secondary | ICD-10-CM

## 2020-09-13 DIAGNOSIS — L97513 Non-pressure chronic ulcer of other part of right foot with necrosis of muscle: Secondary | ICD-10-CM

## 2020-09-13 DIAGNOSIS — M86072 Acute hematogenous osteomyelitis, left ankle and foot: Secondary | ICD-10-CM

## 2020-09-13 DIAGNOSIS — Z9889 Other specified postprocedural states: Secondary | ICD-10-CM

## 2020-09-13 NOTE — Telephone Encounter (Signed)
Patient's wife called to verify that [facility] has orders to pull patient's picc on Monday. Spoke with Helmut Muster with [facility] and she states patient's nurse has orders to pull picc and will pull picc on Monday. Patient wife has been informed.  Wendolyn Raso T Pricilla Loveless

## 2020-09-15 NOTE — Progress Notes (Signed)
  Subjective:  Patient ID: Dean Rasmussen, male    DOB: 04-30-1951,  MRN: [id number]  Chief Complaint  Patient presents with  . Wound Check    pt is here for a wound check of the right foot, pt states that the right foot is doing alot better. Pt also states that there is some present redness in the foot as well.    DOS: 08/12/20 Procedure: right foot debridement, application of integra, absorbable antibiotic beads  69 y.o. male returns for post-op check. Doing well, here with his wife today. Now transitioned from IV abx to PO by ID  Review of Systems: Negative except as noted in the HPI. Denies N/V/F/Ch.   Objective:  There were no vitals filed for this visit. There is no height or weight on file to calculate BMI. Constitutional Well developed. Well nourished.  Vascular Foot warm and well perfused. Capillary refill normal to all digits.   Neurologic Normal speech. Oriented to person, place, and time. Epicritic sensation to light touch grossly present bilaterally.  Dermatologic Medial wound now w/o silicone layer, good granular wound bed, some absorbable CaSO4 beads visible, no signs of infection. Eschar from wound dorsal foot  Orthopedic: Minimal pain    Assessment:  No diagnosis found. Plan:  Patient was evaluated and treated and all questions answered.  S/p foot surgery right -Progressing as expected post-operatively. Good healing of the Integra. Likely needs either in office wound matrix applications vs STSG in operating room. Discussed pros/cons of both with them today in brief -WB Status: NWB RLE -Foot redressed. - Will see Dr Ardelle Anton in 1 week for further planning  No follow-ups on file.

## 2020-09-16 ENCOUNTER — Telehealth: Payer: Self-pay | Admitting: Cardiology

## 2020-09-16 NOTE — Telephone Encounter (Signed)
Shonda from [facility] calling for clarification on why the patient is being referred to them.

## 2020-09-16 NOTE — Telephone Encounter (Signed)
Yes for diabetic foot infection

## 2020-09-17 NOTE — Telephone Encounter (Signed)
Left message for Dean Rasmussen just now and let her know that this referral was placed for the patients diabetic foot ulcer. I also gave our call back number for her to call back with any other questions or concerns.

## 2020-09-19 ENCOUNTER — Other Ambulatory Visit: Payer: Self-pay

## 2020-09-19 ENCOUNTER — Ambulatory Visit (INDEPENDENT_AMBULATORY_CARE_PROVIDER_SITE_OTHER): Payer: No Typology Code available for payment source | Admitting: Podiatry

## 2020-09-19 DIAGNOSIS — L97513 Non-pressure chronic ulcer of other part of right foot with necrosis of muscle: Secondary | ICD-10-CM | POA: Diagnosis not present

## 2020-09-19 NOTE — H&P (View-Only) (Signed)
Cardiology Office Note:    Date:  09/20/2020   ID:  Dean Rasmussen, DOB 1951/07/29, MRN [id number]  PCP:  Patient, [name]  Cardiologist:  Dean Herrlich, MD    Referring MD: No ref. provider found    ASSESSMENT:    1. Persistent atrial fibrillation (HCC)   2. Chronic anticoagulation   3. Hypertensive heart disease without heart failure   4. Essential hypertension   5. Type 2 diabetes mellitus without complication, without long-term current use of insulin (HCC)    PLAN:    In order of problems listed above:  1. Rate is controlled and we will plan cardioversion after 21 days of uninterrupted DOAC.  If ineffective referral to EP 2. Stable hypertension continue current treatment 3. Stable diabetes he requested referral to endocrine Dr. Romero Rasmussen    Next appointment: 6 weeks   Medication Adjustments/Labs and Tests Ordered: Current medicines are reviewed at length with the patient today.  Concerns regarding medicines are outlined above.  Orders Placed This Encounter  Procedures  . Basic metabolic panel  . Magnesium  . CBC  . INR/PT  . Ambulatory referral to Endocrinology  . Ambulatory referral to [facility]  . EKG 12-Lead   No orders of the defined types were placed in this encounter.   Chief complaint ongoing atrial fibrillation preceding cardioversion  History of Present Illness:    Dean Rasmussen is a 69 y.o. male with a hx of atrial fibrillation type 2 diabetes hypertension hypothyroidism frequent ventricular and supraventricular ectopy hypertensive heart disease with left ventricular hypertrophy moderate on echocardiogram in 2018.  He was last seen 08/30/2020 with persistent atrial fibrillation rate controlled anticoagulated.  Compliance with diet, lifestyle and medications: Yes  His wife is present participates in evaluation and decision making.  He was admitted to the hospital with a foot ulcer and septic arthritis.  Surgical debridement and  antibiotic bead placement followed by secondary wound closure.  Methicillin sensitive staph aureus.  She was noted to have intraoperative atrial fibrillation that verted spontaneously to sinus rhythm and was initiated on beta-blocker and anticoagulant.  EKG 08/13/2020 shows atrial fibrillation controlled ventricular rate otherwise normal radiogram performed 08/14/2020 shows severe concentric LVH normal ejection fraction both atrium were normal in size and no significant valvular abnormality.  TSH was assessed and normal   Chart review shows rhythm strips from September 14 15th 16th and 17th showing atrial fibrillation with a controlled ventricular rate he is initiated on anticoagulant therapy with Xarelto with a moderate stroke risk and a chads 2 score of 3.  In general he is doing better he finished IV now on oral antibiotics and his foot is slowly healing does not up anticipate surgical intervention. He is not having symptoms from his atrial fibrillation but is very sedentary he has good healthcare literacy his wife is an Charity fundraiser we reviewed the option of resuming sinus rhythm risk the procedure cardioversion options benefits and risk and he opts to have it performed and will be scheduled after 3 November for ambulatory outpatient cardioversion.  If ineffective he will need referral to EP  He has had no bleeding complication.  He missed 1 dose of Xarelto for removal of his PICC line.  He will be reset for cardioversion after 21 consecutive days of anticoagulation. No edema shortness of breath chest pain  Past Medical History:  Diagnosis Date  . Abscess of right foot   . Atrial fibrillation (HCC) 08/14/2020  . Charcot's arthropathy 08/14/2020  . Diabetes  mellitus without complication (HCC)   . Diabetic neuropathy (HCC) 08/14/2020  . Essential hypertension 08/24/2019  . Hypertension   . Hypertensive heart disease 08/24/2019  . Hypertensive left ventricular hypertrophy, without heart failure 08/24/2019  .  Normocytic anemia 08/14/2020  . Open wound of right foot with complication   . Pain in right arm 05/22/2018  . Type 2 diabetes mellitus without complication, without long-term current use of insulin (HCC) 08/24/2019  . Ventricular ectopy 08/24/2019    Past Surgical History:  Procedure Laterality Date  . FOOT SURGERY Right 07/2020  . I & D EXTREMITY Right 08/09/2020   Procedure: IRRIGATION AND DEBRIDEMENT OF FOOT WITH IMPLANTATION OF ANTIBIOTIC CEMENT BEADS;  Surgeon: Dean Rasmussen, DPM;  Location: [facility] OR;  Service: Podiatry;  Laterality: Right;  . IRRIGATION AND DEBRIDEMENT FOOT Right 08/09/2020   IRRIGATION AND DEBRIDEMENT OF FOOT WITH IMPLANTATION OF ANTIBIOTIC CEMENT BEADS (Right Foot)  . IRRIGATION AND DEBRIDEMENT FOOT Right 08/12/2020   Procedure: Secondary wound closure and removal of antibiotic beads;  Surgeon: Dean Rasmussen, DPM;  Location: [facility] OR;  Service: Podiatry;  Laterality: Right;  . ROTATOR CUFF REPAIR      Current Medications: Current Meds  Medication Sig  . acetaminophen (TYLENOL) 500 MG tablet Take 1,000 mg by mouth every 6 (six) hours as needed (pain).  Marland Kitchen amoxicillin-clavulanate (AUGMENTIN) 875-125 MG tablet Take 1 tablet by mouth 2 (two) times daily.  Marland Kitchen glimepiride (AMARYL) 4 MG tablet Take 1 tablet (4 mg total) by mouth daily with supper.  . levothyroxine (SYNTHROID) 25 MCG tablet Take 1 tablet (25 mcg total) by mouth daily before breakfast.  . metFORMIN (GLUCOPHAGE) 1000 MG tablet Take 2 tablets (2,000 mg total) by mouth daily with supper.  . metoprolol succinate (TOPROL-XL) 100 MG 24 hr tablet Take 1 tablet (100 mg total) by mouth daily.  . minocycline (MINOCIN) 100 MG capsule Take 100 mg by mouth See admin instructions. At onset of rosacea flare take one capsule (100 mg) twice daily for a few days, then take one capsule (100 mg) daily for a few days, then take one capsule (100 mg) every other day until clear. Resume when rosacea flares back up.     Allergies:    Patient has no known allergies.   Social History   Socioeconomic History  . Marital status: Married    Spouse name: Not on file  . Number of children: Not on file  . Years of education: Not on file  . Highest education level: Not on file  Occupational History  . Not on file  Tobacco Use  . Smoking status: Never Smoker  . Smokeless tobacco: Never Used  Vaping Use  . Vaping Use: Never used  Substance and Sexual Activity  . Alcohol use: Yes    Comment: occasional  . Drug use: Never  . Sexual activity: Not on file  Other Topics Concern  . Not on file  Social History Narrative  . Not on file   Social Determinants of Health   Financial Resource Strain:   . Difficulty of Paying Living Expenses: Not on file  Food Insecurity:   . Worried About Programme researcher, broadcasting/film/video in the Last Year: Not on file  . Ran Out of Food in the Last Year: Not on file  Transportation Needs:   . Lack of Transportation (Medical): Not on file  . Lack of Transportation (Non-Medical): Not on file  Physical Activity:   . Days of Exercise per Week: Not on  file  . Minutes of Exercise per Session: Not on file  Stress:   . Feeling of Stress : Not on file  Social Connections:   . Frequency of Communication with Friends and Family: Not on file  . Frequency of Social Gatherings with Friends and Family: Not on file  . Attends Religious Services: Not on file  . Active Member of Clubs or Organizations: Not on file  . Attends Banker Meetings: Not on file  . Marital Status: Not on file     Family History: The patient's family history includes Atrial fibrillation in his mother; Bladder Cancer in his father; CAD in his brother; Diabetes in his brother and mother; Heart attack in his brother; Heart disease in his mother; Other in his sister. ROS:   Please see the history of present illness.    All other systems reviewed and are negative.  EKGs/Labs/Other Studies Reviewed:    The following studies were  reviewed today:  EKG:  EKG ordered today and personally reviewed.  The ekg ordered today demonstrates atrial fibrillation controlled rate  Recent Labs: 08/09/2020: TSH 0.892 08/10/2020: ALT 16 08/16/2020: BUN 15; Creatinine, Ser 1.08; Hemoglobin 9.8; Magnesium 1.8; Platelets 214; Potassium 4.1; Sodium 137  Recent Lipid Panel No results found for: CHOL, TRIG, HDL, CHOLHDL, VLDL, LDLCALC, LDLDIRECT  Physical Exam:    VS:  BP 128/76   Pulse 82   Ht 5\' 10"  (1.778 m)   Wt 176 lb 0.6 oz (79.9 kg)   SpO2 99%   BMI 25.26 kg/m     Wt Readings from Last 3 Encounters:  09/20/20 176 lb 0.6 oz (79.9 kg)  08/30/20 173 lb 9.6 oz (78.7 kg)  08/09/20 180 lb (81.6 kg)     GEN: Does not look acutely or chronically ill well nourished, well developed in no acute distress HEENT: Normal NECK: No JVD; No carotid bruits LYMPHATICS: No lymphadenopathy CARDIAC: S1 variable irregular rhythm  no murmurs, rubs, gallops RESPIRATORY:  Clear to auscultation without rales, wheezing or rhonchi  ABDOMEN: Soft, non-tender, non-distended MUSCULOSKELETAL:  No edema; No deformity  SKIN: Warm and dry NEUROLOGIC:  Alert and oriented x 3 PSYCHIATRIC:  Normal affect    Signed, 10/09/20, MD  09/20/2020 9:44 AM    [facility]

## 2020-09-19 NOTE — Progress Notes (Signed)
Cardiology Office Note:    Date:  09/20/2020   ID:  Dean Rasmussen, DOB 1951/07/29, MRN [id number]  PCP:  Dean Rasmussen  Cardiologist:  Dean Herrlich, MD    Referring MD: Dean Rasmussen    ASSESSMENT:    1. Persistent atrial fibrillation (HCC)   2. Chronic anticoagulation   3. Hypertensive heart disease without heart failure   4. Essential hypertension   5. Type 2 diabetes mellitus without complication, without long-term current use of insulin (HCC)    PLAN:    In order of problems listed above:  1. Rate is controlled and we will plan cardioversion after 21 days of uninterrupted DOAC.  If ineffective referral to EP 2. Stable hypertension continue current treatment 3. Stable diabetes he requested referral to endocrine Dr. Romero Rasmussen    Next appointment: 6 weeks   Medication Adjustments/Labs and Tests Ordered: Current medicines are reviewed at length with the patient today.  Concerns regarding medicines are outlined above.  Orders Placed This Encounter  Procedures  . Basic metabolic panel  . Magnesium  . CBC  . INR/PT  . Ambulatory referral to Endocrinology  . Ambulatory referral to [facility]  . EKG 12-Lead   Dean orders of the defined types were placed in this encounter.   Chief complaint ongoing atrial fibrillation preceding cardioversion  History of Present Illness:    Dean Rasmussen is a 69 y.o. male with a hx of atrial fibrillation type 2 diabetes hypertension hypothyroidism frequent ventricular and supraventricular ectopy hypertensive heart disease with left ventricular hypertrophy moderate on echocardiogram in 2018.  He was last seen 08/30/2020 with persistent atrial fibrillation rate controlled anticoagulated.  Compliance with diet, lifestyle and medications: Yes  His wife is present participates in evaluation and decision making.  He was admitted to the hospital with a foot ulcer and septic arthritis.  Surgical debridement and  antibiotic bead placement followed by secondary wound closure.  Methicillin sensitive staph aureus.  She was noted to have intraoperative atrial fibrillation that verted spontaneously to sinus rhythm and was initiated on beta-blocker and anticoagulant.  EKG 08/13/2020 shows atrial fibrillation controlled ventricular rate otherwise normal radiogram performed 08/14/2020 shows severe concentric LVH normal ejection fraction both atrium were normal in size and Dean significant valvular abnormality.  TSH was assessed and normal   Chart review shows rhythm strips from September 14 15th 16th and 17th showing atrial fibrillation with a controlled ventricular rate he is initiated on anticoagulant therapy with Xarelto with a moderate stroke risk and a chads 2 score of 3.  In general he is doing better he finished IV now on oral antibiotics and his foot is slowly healing does not up anticipate surgical intervention. He is not having symptoms from his atrial fibrillation but is very sedentary he has good healthcare literacy his wife is an Charity fundraiser we reviewed the option of resuming sinus rhythm risk the procedure cardioversion options benefits and risk and he opts to have it performed and will be scheduled after 3 November for ambulatory outpatient cardioversion.  If ineffective he will need referral to EP  He has had Dean bleeding complication.  He missed 1 dose of Xarelto for removal of his PICC line.  He will be reset for cardioversion after 21 consecutive days of anticoagulation. Dean edema shortness of breath chest pain  Past Medical History:  Diagnosis Date  . Abscess of right foot   . Atrial fibrillation (HCC) 08/14/2020  . Charcot's arthropathy 08/14/2020  . Diabetes  mellitus without complication (HCC)   . Diabetic neuropathy (HCC) 08/14/2020  . Essential hypertension 08/24/2019  . Hypertension   . Hypertensive heart disease 08/24/2019  . Hypertensive left ventricular hypertrophy, without heart failure 08/24/2019  .  Normocytic anemia 08/14/2020  . Open wound of right foot with complication   . Pain in right arm 05/22/2018  . Type 2 diabetes mellitus without complication, without long-term current use of insulin (HCC) 08/24/2019  . Ventricular ectopy 08/24/2019    Past Surgical History:  Procedure Laterality Date  . FOOT SURGERY Right 07/2020  . I & D EXTREMITY Right 08/09/2020   Procedure: IRRIGATION AND DEBRIDEMENT OF FOOT WITH IMPLANTATION OF ANTIBIOTIC CEMENT BEADS;  Surgeon: Dean Rasmussen, DPM;  Location: [facility] OR;  Service: Podiatry;  Laterality: Right;  . IRRIGATION AND DEBRIDEMENT FOOT Right 08/09/2020   IRRIGATION AND DEBRIDEMENT OF FOOT WITH IMPLANTATION OF ANTIBIOTIC CEMENT BEADS (Right Foot)  . IRRIGATION AND DEBRIDEMENT FOOT Right 08/12/2020   Procedure: Secondary wound closure and removal of antibiotic beads;  Surgeon: Dean Rasmussen, DPM;  Location: [facility] OR;  Service: Podiatry;  Laterality: Right;  . ROTATOR CUFF REPAIR      Current Medications: Current Meds  Medication Sig  . acetaminophen (TYLENOL) 500 MG tablet Take 1,000 mg by mouth every 6 (six) hours as needed (pain).  Marland Kitchen amoxicillin-clavulanate (AUGMENTIN) 875-125 MG tablet Take 1 tablet by mouth 2 (two) times daily.  Marland Kitchen glimepiride (AMARYL) 4 MG tablet Take 1 tablet (4 mg total) by mouth daily with supper.  . levothyroxine (SYNTHROID) 25 MCG tablet Take 1 tablet (25 mcg total) by mouth daily before breakfast.  . metFORMIN (GLUCOPHAGE) 1000 MG tablet Take 2 tablets (2,000 mg total) by mouth daily with supper.  . metoprolol succinate (TOPROL-XL) 100 MG 24 hr tablet Take 1 tablet (100 mg total) by mouth daily.  . minocycline (MINOCIN) 100 MG capsule Take 100 mg by mouth See admin instructions. At onset of rosacea flare take one capsule (100 mg) twice daily for a few days, then take one capsule (100 mg) daily for a few days, then take one capsule (100 mg) every other day until clear. Resume when rosacea flares back up.     Allergies:    Patient has Dean known allergies.   Social History   Socioeconomic History  . Marital status: Married    Spouse name: Not on file  . Number of children: Not on file  . Years of education: Not on file  . Highest education level: Not on file  Occupational History  . Not on file  Tobacco Use  . Smoking status: Never Smoker  . Smokeless tobacco: Never Used  Vaping Use  . Vaping Use: Never used  Substance and Sexual Activity  . Alcohol use: Yes    Comment: occasional  . Drug use: Never  . Sexual activity: Not on file  Other Topics Concern  . Not on file  Social History Narrative  . Not on file   Social Determinants of Health   Financial Resource Strain:   . Difficulty of Paying Living Expenses: Not on file  Food Insecurity:   . Worried About Programme researcher, broadcasting/film/video in the Last Year: Not on file  . Ran Out of Food in the Last Year: Not on file  Transportation Needs:   . Lack of Transportation (Medical): Not on file  . Lack of Transportation (Non-Medical): Not on file  Physical Activity:   . Days of Exercise Rasmussen Week: Not on  file  . Minutes of Exercise Rasmussen Session: Not on file  Stress:   . Feeling of Stress : Not on file  Social Connections:   . Frequency of Communication with Friends and Family: Not on file  . Frequency of Social Gatherings with Friends and Family: Not on file  . Attends Religious Services: Not on file  . Active Member of Clubs or Organizations: Not on file  . Attends Banker Meetings: Not on file  . Marital Status: Not on file     Family History: The patient's family history includes Atrial fibrillation in his mother; Bladder Cancer in his father; CAD in his brother; Diabetes in his brother and mother; Heart attack in his brother; Heart disease in his mother; Other in his sister. ROS:   Please see the history of present illness.    All other systems reviewed and are negative.  EKGs/Labs/Other Studies Reviewed:    The following studies were  reviewed today:  EKG:  EKG ordered today and personally reviewed.  The ekg ordered today demonstrates atrial fibrillation controlled rate  Recent Labs: 08/09/2020: TSH 0.892 08/10/2020: ALT 16 08/16/2020: BUN 15; Creatinine, Ser 1.08; Hemoglobin 9.8; Magnesium 1.8; Platelets 214; Potassium 4.1; Sodium 137  Recent Lipid Panel Dean results Rasmussen for: CHOL, TRIG, HDL, CHOLHDL, VLDL, LDLCALC, LDLDIRECT  Physical Exam:    VS:  BP 128/76   Pulse 82   Ht 5\' 10"  (1.778 m)   Wt 176 lb 0.6 oz (79.9 kg)   SpO2 99%   BMI 25.26 kg/m     Wt Readings from Last 3 Encounters:  09/20/20 176 lb 0.6 oz (79.9 kg)  08/30/20 173 lb 9.6 oz (78.7 kg)  08/09/20 180 lb (81.6 kg)     GEN: Does not look acutely or chronically ill well nourished, well developed in Dean acute distress HEENT: Normal NECK: Dean JVD; Dean carotid bruits LYMPHATICS: Dean lymphadenopathy CARDIAC: S1 variable irregular rhythm  Dean murmurs, rubs, gallops RESPIRATORY:  Clear to auscultation without rales, wheezing or rhonchi  ABDOMEN: Soft, non-tender, non-distended MUSCULOSKELETAL:  Dean edema; Dean deformity  SKIN: Warm and dry NEUROLOGIC:  Alert and oriented x 3 PSYCHIATRIC:  Normal affect    Signed, 10/09/20, MD  09/20/2020 9:44 AM    [facility]

## 2020-09-20 ENCOUNTER — Ambulatory Visit (INDEPENDENT_AMBULATORY_CARE_PROVIDER_SITE_OTHER): Payer: No Typology Code available for payment source | Admitting: Cardiology

## 2020-09-20 ENCOUNTER — Encounter: Payer: Self-pay | Admitting: Cardiology

## 2020-09-20 VITALS — BP 128/76 | HR 82 | Ht 70.0 in | Wt 176.0 lb

## 2020-09-20 DIAGNOSIS — E119 Type 2 diabetes mellitus without complications: Secondary | ICD-10-CM

## 2020-09-20 DIAGNOSIS — I10 Essential (primary) hypertension: Secondary | ICD-10-CM | POA: Diagnosis not present

## 2020-09-20 DIAGNOSIS — I4819 Other persistent atrial fibrillation: Secondary | ICD-10-CM

## 2020-09-20 DIAGNOSIS — I119 Hypertensive heart disease without heart failure: Secondary | ICD-10-CM | POA: Diagnosis not present

## 2020-09-20 DIAGNOSIS — Z7901 Long term (current) use of anticoagulants: Secondary | ICD-10-CM | POA: Diagnosis not present

## 2020-09-20 NOTE — Patient Instructions (Signed)
Medication Instructions:  Your physician recommends that you continue on your current medications as directed. Please refer to the Current Medication list given to you today.  *If you need a refill on your cardiac medications before your next appointment, please call your pharmacy*   Lab Work: Your physician recommends that you return for lab work in: TODAY BMP, CBC, PT, Mag If you have labs (blood work) drawn today and your tests are completely normal, you will receive your results only by: Marland Kitchen MyChart Message (if you have MyChart) OR . A paper copy in the mail If you have any lab test that is abnormal or we need to change your treatment, we will call you to review the results.   Testing/Procedures: You are scheduled for a Cardioversion on November 11th with Dr. Duke Salvia.  Please arrive at the [facility] (Main Entrance A) at [facility]: [address] [place], [place] [address] at 8 am. (1 hour prior to procedure unless lab work is needed; if lab work is needed arrive 1.5 hours ahead)  DIET: Nothing to eat or drink after midnight except a sip of water with medications (see medication instructions below)  Medication Instructions: Hold Metformin and Amaryl the morning of the procedure.   Continue your anticoagulant: Xarelto You will need to continue your anticoagulant after your procedure until you are told by your  Provider that it is safe to stop  You must have a responsible person to drive you home and stay in the waiting area during your procedure. Failure to do so could result in cancellation.  Bring your insurance cards.  *Special Note: Every effort is made to have your procedure done on time. Occasionally there are emergencies that occur at the hospital that may cause delays. Please be patient if a delay does occur.     Follow-Up: At [facility], you and your health needs are our priority.  As part of our continuing mission to provide you with exceptional heart  care, we have created designated Provider Care Teams.  These Care Teams include your primary Cardiologist (physician) and Advanced Practice Providers (APPs -  Physician Assistants and Nurse Practitioners) who all work together to provide you with the care you need, when you need it.  We recommend signing up for the patient portal called "MyChart".  Sign up information is provided on this After Visit Summary.  MyChart is used to connect with patients for Virtual Visits (Telemedicine).  Patients are able to view lab/test results, encounter notes, upcoming appointments, etc.  Non-urgent messages can be sent to your provider as well.   To learn more about what you can do with MyChart, go to [email or web address].    Your next appointment:   6 week(s)  The format for your next appointment:   In Person  Provider:   Norman Herrlich, MD   Other Instructions

## 2020-09-21 LAB — PROTIME-INR
INR: 1.2 (ref 0.9–1.2)
Prothrombin Time: 12.7 s — ABNORMAL HIGH (ref 9.1–12.0)

## 2020-09-21 LAB — CBC
Hematocrit: 33.8 % — ABNORMAL LOW (ref 37.5–51.0)
Hemoglobin: 11 g/dL — ABNORMAL LOW (ref 13.0–17.7)
MCH: 29.7 pg (ref 26.6–33.0)
MCHC: 32.5 g/dL (ref 31.5–35.7)
MCV: 91 fL (ref 79–97)
Platelets: 265 10*3/uL (ref 150–450)
RBC: 3.7 x10E6/uL — ABNORMAL LOW (ref 4.14–5.80)
RDW: 13.2 % (ref 11.6–15.4)
WBC: 8.7 10*3/uL (ref 3.4–10.8)

## 2020-09-21 LAB — BASIC METABOLIC PANEL
BUN/Creatinine Ratio: 17 (ref 10–24)
BUN: 19 mg/dL (ref 8–27)
CO2: 25 mmol/L (ref 20–29)
Calcium: 9.8 mg/dL (ref 8.6–10.2)
Chloride: 100 mmol/L (ref 96–106)
Creatinine, Ser: 1.09 mg/dL (ref 0.76–1.27)
GFR calc Af Amer: 80 mL/min/{1.73_m2} (ref >59)
GFR calc non Af Amer: 69 mL/min/{1.73_m2} (ref >59)
Glucose: 161 mg/dL — ABNORMAL HIGH (ref 65–99)
Potassium: 4.3 mmol/L (ref 3.5–5.2)
Sodium: 140 mmol/L (ref 134–144)

## 2020-09-21 LAB — MAGNESIUM: Magnesium: 1.6 mg/dL (ref 1.6–2.3)

## 2020-09-24 ENCOUNTER — Telehealth: Payer: Self-pay

## 2020-09-24 MED ORDER — MAGNESIUM 400 MG PO CAPS: 400.0000 mg | ORAL_CAPSULE | Freq: Every day | ORAL | 0 refills | Status: DC

## 2020-09-24 NOTE — Telephone Encounter (Signed)
Medication sent in per request.  

## 2020-09-24 NOTE — Progress Notes (Signed)
Subjective: Dean Rasmussen is a 69 y.o. is seen today in office s/p right foot incision and drainage, application of antibiotic beads the return to the operating room for wound closure, graft application which occurred on August 12, 2020 with Dr. Lilian Kapur.  Overall states he is doing well.  His wife to change the bandage was to Take followed by dry dressing.  He is on oral antibiotics per infectious disease currently.  Denies any fevers, chills, nausea, vomiting.  No calf pain, chest pain, shortness of breath.  Objective: General: No acute distress, AAOx3  DP/PT pulses palpable 2/4, CRT < 3 sec to all digits.  RIGHT foot: Ulceration of the dorsal aspect of the right foot measures 6 x 2.5 cm with a granular wound base there is no surrounding erythema, ascending cellulitis there is no fluctuation crepitation.  There is no malodor.  Hyperkeratotic periwound.  Calcium sulfate beads are visible but no signs of infection.  Scab present to dorsal aspect of foot from the incision but appears to be healing well without any signs of infection.  No other open lesions identified today. No pain with calf compression, swelling, warmth, erythema.    Assessment and Plan:  Status post right foot surgery  -Treatment options discussed including all alternatives, risks, and complications -Today sharply debrided the wound edges utilizing the 312 with scalpel down to healthy, bleeding, viable edges.  Continue Adaptic carbide dressing and she can change the bandage every other day.  Continue nonweightbearing.  Courage elevation.  Continue oral antibiotics per infectious disease.  Discussed possibly trying operative for grafting but the wound appears to be healthy we will continue to monitor.  Vivi Barrack DPM

## 2020-09-24 NOTE — Telephone Encounter (Signed)
I will send what I usually use which is magnesium 400 mg daily

## 2020-09-24 NOTE — Telephone Encounter (Signed)
Spoke with patient regarding results and recommendation.  Patient verbalizes understanding and is agreeable to plan of care. Advised patient to call back with any issues or concerns.   I am going to forward this to Dr. Servando Salina to see what dosage of slow mag Dr. Dulce Sellar would like the patient to be on.

## 2020-09-24 NOTE — Addendum Note (Signed)
Addended by: Delorse Limber I on: 09/24/2020 02:58 PM   Modules accepted: Orders

## 2020-09-26 ENCOUNTER — Other Ambulatory Visit: Payer: Self-pay

## 2020-09-26 ENCOUNTER — Ambulatory Visit: Payer: No Typology Code available for payment source | Admitting: Podiatry

## 2020-09-26 ENCOUNTER — Ambulatory Visit (INDEPENDENT_AMBULATORY_CARE_PROVIDER_SITE_OTHER): Payer: No Typology Code available for payment source | Admitting: Podiatry

## 2020-09-26 DIAGNOSIS — L97513 Non-pressure chronic ulcer of other part of right foot with necrosis of muscle: Secondary | ICD-10-CM

## 2020-09-27 ENCOUNTER — Ambulatory Visit: Payer: No Typology Code available for payment source | Admitting: Podiatry

## 2020-09-28 NOTE — Progress Notes (Signed)
Subjective: Dean Rasmussen is a 69 y.o. is seen today in office s/p right foot incision and drainage, application of antibiotic beads the return to the operating room for wound closure, graft application which occurred on August 12, 2020 with Dr. Lilian Kapur.  His wife's continue daily dressing changes with Adaptic followed by dry dressing.  Seems to be doing better.  The patient is some bleeding from the wound but no pus.  No increase in swelling or redness.  He is off of oral antibiotics at this time as well. Denies any fevers, chills, nausea, vomiting.  No calf pain, chest pain, shortness of breath.  He is going to [place] this weekend but he is cannot keep his foot elevated stay off of his foot.  He has to go back in order to get help cleared out in order to sell it.  He recently just moved to [place].  Objective: General: No acute distress, AAOx3  DP/PT pulses palpable 2/4, CRT < 3 sec to all digits.  RIGHT foot: Ulceration of the dorsal aspect of the right foot measures 6 x 2.4 cm with a granular wound base.  Hyperkeratotic periwound there is no surrounding erythema, ascending cellulitis.  There is no fluctuation or crepitation there is no malodor. Scab/eschar on the dorsal foot which is starting to come off on its own. There is no underlying drainage or pus from skin appear to be healthy from what I was able to debride today. No pain with calf compression, swelling, warmth, erythema.    Assessment and Plan:  Status post right foot surgery  -Treatment options discussed including all alternatives, risks, and complications -Debrided the wound edges utilizing the 312 with scalpel down to healthy, bleeding, viable tissue treatment revitalized tissue.  Clean the wound as well.  Adaptic followed by dressing was applied.  Appears to be doing somewhat better.  Discussed possible grafting and will we consider this when he gets back from [place].  Encouraged elevation at all times and stay  off the foot. -Monitor for any clinical signs or symptoms of infection and directed to call the office immediately should any occur or go to the ER.  *x-ray next appointment   No follow-ups on file.  Vivi Barrack DPM

## 2020-10-01 ENCOUNTER — Telehealth: Payer: Self-pay | Admitting: Cardiology

## 2020-10-01 NOTE — Telephone Encounter (Signed)
Patient is requesting to reschedule cardioversion scheduled for 10/10/20 with Dr. Duke Salvia. Please assist.

## 2020-10-01 NOTE — Telephone Encounter (Signed)
Spoke to the patient just now and he let me know that he has had something come up and needs to reschedule his cardioversion.   I called and got this rescheduled for 10/16/20 at 10 AM with Dr. Flora Lipps. He needs to arrive at 9 AM. The COVID swab was also rescheduled to 10/14/20 at 9:25 AM.   I called the patient back and let him know of these rescheduled dates and he states that this will work out great for him. He wrote down the appointment dates and times and thanks me for the call back.    Encouraged patient to call back with any questions or concerns.

## 2020-10-04 ENCOUNTER — Ambulatory Visit (INDEPENDENT_AMBULATORY_CARE_PROVIDER_SITE_OTHER): Payer: No Typology Code available for payment source | Admitting: Podiatry

## 2020-10-04 ENCOUNTER — Other Ambulatory Visit: Payer: Self-pay

## 2020-10-04 ENCOUNTER — Ambulatory Visit (INDEPENDENT_AMBULATORY_CARE_PROVIDER_SITE_OTHER): Payer: No Typology Code available for payment source

## 2020-10-04 DIAGNOSIS — M86072 Acute hematogenous osteomyelitis, left ankle and foot: Secondary | ICD-10-CM

## 2020-10-04 DIAGNOSIS — M86179 Other acute osteomyelitis, unspecified ankle and foot: Secondary | ICD-10-CM

## 2020-10-04 DIAGNOSIS — M86171 Other acute osteomyelitis, right ankle and foot: Secondary | ICD-10-CM | POA: Diagnosis not present

## 2020-10-04 NOTE — Progress Notes (Signed)
Subjective: Dean Rasmussen is a 69 y.o. is seen today in office s/p right foot incision and drainage, application of antibiotic beads the return to the operating room for wound closure, graft application which occurred on August 12, 2020 with Dr. Lilian Kapur.  The wound has been doing well and his wife has been changing the bandage.  Denies any drainage or pus or any swelling.  He does have a wound on his left medial ankle 40 minutes on the knee scooter.  No drainage or pus or swelling.  He has been keeping bacitracin on this area.  Denies any fevers, chills, nausea, vomiting.  No calf pain, chest pain, shortness of breath.   Objective: General: No acute distress, AAOx3  DP/PT pulses palpable 2/4, CRT < 3 sec to all digits.  RIGHT foot: Ulceration of the dorsal aspect of the right foot measures 4.5x 2.2 cm with a granular wound base.  Hyperkeratotic periwound there is no surrounding erythema, ascending cellulitis.  There is no fluctuation or crepitation there is no malodor. Scab/eschar on the dorsal foot without any surrounding erythema, drainage or pus or ascending cellulitis.  On the medial aspect left ankle at the medial malleolus is a superficial wound measuring 0.3 x 0.3 x 0.1 cm without any surrounding erythema, ascending cellulitis.  No fluctuation crepitation.  There is no malodor.  No pain with calf compression, swelling, warmth, erythema.    Assessment and Plan:  Status post right foot surgery; left ankle ulcer  -Treatment options discussed including all alternatives, risks, and complications -X-rays obtained independent reviewed.  Arthritic changes present midfoot.  The area of concern on the medial foot appears to be consolidating but awaiting radiology report. -Sharply debrided the wound edges utilizing the 312 with scalpel down to healthy, bleeding, viable tissue treatment revitalized tissue.Marland Kitchen  The wound was cleaned.  Adaptic followed by dressing was applied.  Wound is improving tomorrow  hold off return to the operating room for further grafting.  Encourage elevation, nonweightbearing. -Regards to left ankle recommended antibiotic ointment.  Dispensed offloading pads. -Monitor for any clinical signs or symptoms of infection and directed to call the office immediately should any occur or go to the ER.  RTC 1 week  Vivi Barrack DPM

## 2020-10-07 ENCOUNTER — Other Ambulatory Visit (HOSPITAL_COMMUNITY): Payer: No Typology Code available for payment source

## 2020-10-07 NOTE — Addendum Note (Signed)
Addended by: Delorse Limber I on: 10/07/2020 01:46 PM   Modules accepted: Orders

## 2020-10-08 ENCOUNTER — Other Ambulatory Visit: Payer: Self-pay

## 2020-10-08 ENCOUNTER — Encounter: Payer: Self-pay | Admitting: Internal Medicine

## 2020-10-08 ENCOUNTER — Ambulatory Visit (INDEPENDENT_AMBULATORY_CARE_PROVIDER_SITE_OTHER): Payer: No Typology Code available for payment source | Admitting: Internal Medicine

## 2020-10-08 DIAGNOSIS — L02611 Cutaneous abscess of right foot: Secondary | ICD-10-CM | POA: Diagnosis not present

## 2020-10-08 NOTE — Progress Notes (Signed)
[facility]  Patient Active Problem List   Diagnosis Date Noted  . Open wound of right foot with complication     Priority: High  . Abscess of right foot     Priority: High  . Hypertension   . Diabetes mellitus without complication (HCC)   . Diabetic neuropathy (HCC) 08/14/2020  . Charcot's arthropathy 08/14/2020  . Normocytic anemia 08/14/2020  . Atrial fibrillation (HCC) 08/14/2020  . Hypertensive heart disease 08/24/2019  . Hypertensive left ventricular hypertrophy, without heart failure 08/24/2019  . Type 2 diabetes mellitus without complication, without long-term current use of insulin (HCC) 08/24/2019  . Ventricular ectopy 08/24/2019  . Essential hypertension 08/24/2019  . Pain in right arm 05/22/2018    Patient's Medications  New Prescriptions   No medications on file  Previous Medications   ACETAMINOPHEN (TYLENOL) 500 MG TABLET    Take 1,000 mg by mouth every 6 (six) hours as needed (pain).   GLIMEPIRIDE (AMARYL) 4 MG TABLET    Take 1 tablet (4 mg total) by mouth daily with supper.   LEVOTHYROXINE (SYNTHROID) 25 MCG TABLET    Take 1 tablet (25 mcg total) by mouth daily before breakfast.   MAGNESIUM 400 MG CAPS    Take 400 mg by mouth daily.   METFORMIN (GLUCOPHAGE) 1000 MG TABLET    Take 2 tablets (2,000 mg total) by mouth daily with supper.   METOPROLOL SUCCINATE (TOPROL-XL) 100 MG 24 HR TABLET    Take 1 tablet (100 mg total) by mouth daily.   MINOCYCLINE (MINOCIN) 100 MG CAPSULE    Take 100 mg by mouth See admin instructions. At onset of rosacea flare take one capsule (100 mg) twice daily for a few days, then take one capsule (100 mg) daily for a few days, then take one capsule (100 mg) every other day until clear. Resume when rosacea flares back up.   RIVAROXABAN (XARELTO) 20 MG TABS TABLET    Take 1 tablet (20 mg total) by mouth daily with supper.  Modified Medications   No medications on file  Discontinued Medications    AMOXICILLIN-CLAVULANATE (AUGMENTIN) 875-125 MG TABLET    Take 1 tablet by mouth 2 (two) times daily.    Subjective: Dean Rasmussen is in with his wife, Harriett Sine, for his hospital follow-up visit.  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  He and his wife recently moved here from [place].  They have only been here for 1 week. They have been staying in a hotel while there furniture is moved into their apartment.  She recently noticed something on his right foot.  He has been hiding from her for several months brought in to the emergency department on 08/07/2020.  He told the ED staff there that for several months he has had an enlarging nodule on the medial plantar aspect of his right foot.  It has become more painful recently that about 2 months ago it ulcerated and started to ooze malodorous fluid.  He was try to keep it clean and covered so she would not learn until after they have made their move.  He was referred from the ED to Dr. Loreta Ave who performed an MRI which showed a large abscess on the plantar surface of his foot and question early changes of septic arthritis in the first TMT joint and possible osteomyelitis in the first metatarsal base and medial  cuneiform bone.  He was admitted on 08/09/2020 and underwent surgery where the large abscess was drained and debrided.  The areas of questionable pretty healthy.  A bone biopsy showed benign bone on pathology review.  Abscess and bone cultures are growing MSSA, strep anginosis and Bacteroides fragilis.  He went back to the OR for further debridement and wound closure on 08/12/2020.  His wife is a Company secretary with ICU and oncology experience.  She is currently working for [employer].  A PICC was placed and he was discharged on IV ertapenem.  He has now completed 4 weeks of antibiotic therapy on 09/10/2020 before converting to oral amoxicillin clavulanate for 2 more weeks.  He had some mild upset stomach and  diarrhea that resolved promptly after stopping antibiotics.  He is feeling much better and working very hard on blood sugar control.   Review of Systems: Review of Systems  Constitutional: Negative for chills, diaphoresis and fever.  Respiratory: Negative for cough.   Cardiovascular: Negative for chest pain and palpitations.  Gastrointestinal: Negative for abdominal pain, diarrhea, nausea and vomiting.  Musculoskeletal: Negative for joint pain.    Past Medical History:  Diagnosis Date  . Abscess of right foot   . Atrial fibrillation (HCC) 08/14/2020  . Charcot's arthropathy 08/14/2020  . Diabetes mellitus without complication (HCC)   . Diabetic neuropathy (HCC) 08/14/2020  . Essential hypertension 08/24/2019  . Hypertension   . Hypertensive heart disease 08/24/2019  . Hypertensive left ventricular hypertrophy, without heart failure 08/24/2019  . Normocytic anemia 08/14/2020  . Open wound of right foot with complication   . Pain in right arm 05/22/2018  . Type 2 diabetes mellitus without complication, without long-term current use of insulin (HCC) 08/24/2019  . Ventricular ectopy 08/24/2019    Social History   Tobacco Use  . Smoking status: Never Smoker  . Smokeless tobacco: Never Used  Vaping Use  . Vaping Use: Never used  Substance Use Topics  . Alcohol use: Yes    Comment: occasional  . Drug use: Never    Family History  Problem Relation Age of Onset  . Heart disease Mother   . Atrial fibrillation Mother   . Diabetes Mother   . Bladder Cancer Father   . Other Sister        No known health problems  . Heart attack Brother   . CAD Brother   . Diabetes Brother     No Known Allergies  Objective: Vitals:   10/08/20 1600  BP: (!) 149/72  Pulse: 74  Weight: 173 lb 9.6 oz (78.7 kg)  Height: 5\' 10"  (1.778 m)   Body mass index is 24.91 kg/m.  Physical Exam Constitutional:      Comments: He is in good spirits.  He is accompanied by his wife.  Pulmonary:      Effort: Pulmonary effort is normal.  Psychiatric:        Mood and Affect: Mood normal.     Taken 09/26/2020      Problem List Items Addressed This Visit    None       09/28/2020, MD [facility] [phone] pager   [phone] cell 10/08/2020, 4:51 PM

## 2020-10-08 NOTE — Assessment & Plan Note (Signed)
He is doing much better.  He had an x-ray last week which showed some mild cortical irregularity of his first metatarsal.  He almost certainly had early osteomyelitis complicating his smoldering foot abscess but I am hopeful that all of this has been cured through a combination of surgery and 6 weeks of antibiotics.  He can follow-up here as needed.

## 2020-10-10 ENCOUNTER — Ambulatory Visit (INDEPENDENT_AMBULATORY_CARE_PROVIDER_SITE_OTHER): Payer: No Typology Code available for payment source | Admitting: Podiatry

## 2020-10-10 ENCOUNTER — Encounter: Payer: Self-pay | Admitting: Podiatry

## 2020-10-10 ENCOUNTER — Other Ambulatory Visit: Payer: Self-pay

## 2020-10-10 DIAGNOSIS — L97513 Non-pressure chronic ulcer of other part of right foot with necrosis of muscle: Secondary | ICD-10-CM | POA: Diagnosis not present

## 2020-10-14 ENCOUNTER — Other Ambulatory Visit (HOSPITAL_COMMUNITY)
Admission: RE | Admit: 2020-10-14 | Discharge: 2020-10-14 | Disposition: A | Discharge status: Home / Self Care | Payer: No Typology Code available for payment source | Source: Ambulatory Visit | Attending: Cardiovascular Disease | Admitting: Cardiovascular Disease

## 2020-10-14 DIAGNOSIS — Z20822 Contact with and (suspected) exposure to covid-19: Secondary | ICD-10-CM | POA: Diagnosis not present

## 2020-10-14 DIAGNOSIS — Z01812 Encounter for preprocedural laboratory examination: Secondary | ICD-10-CM | POA: Diagnosis present

## 2020-10-14 LAB — SARS CORONAVIRUS 2 (TAT 6-24 HRS): SARS Coronavirus 2: NEGATIVE

## 2020-10-15 NOTE — Progress Notes (Signed)
Subjective: Dean Rasmussen is a 69 y.o. is seen today in office s/p right foot incision and drainage, application of antibiotic beads the return to the operating room for wound closure, graft application which occurred on August 12, 2020 with Dr. Lilian Kapur.  His wife has been changing the bandage with Adaptic and a dry dressing to the area.  He feels the wound has been healing.  He has seen some bloody drainage at times but denies any pus or any increase in swelling or redness to his foot.  He has no other concerns today.  Denies any fevers, chills, nausea, vomiting.  No calf pain, chest pain, shortness of breath.   Objective: General: No acute distress, AAOx3 -presents with son today DP/PT pulses palpable 2/4, CRT < 3 sec to all digits.  RIGHT foot: Ulceration of the dorsal aspect of the right foot measures smaller at 4.2 cm with a granular wound base.  There is no surrounding erythema, ascending cellulitis there is no fluctuation capitation.  Hyperkeratotic periwound.  Scab present to dorsal aspect of midfoot which has come off some more there is healthy skin present underneath the area.  There is no other open lesions identified at this time. No pain with calf compression, swelling, warmth, erythema.   Assessment and Plan:  Status post right foot surgery; right foot ulceration  -Treatment options discussed including all alternatives, risks, and complications -Sharply debrided the wound edges utilizing the 312 with scalpel down to healthy, bleeding, viable tissue treatment to remove devitalized nonviable tissue.  The wound was cleaned.  Adaptic followed by dressing was applied.  The wound is continued to improve so would hold off on further grafting.  Patient to continue elevation, nonweightbearing for now. -Monitor for any clinical signs or symptoms of infection and directed to call the office immediately should any occur or go to the ER.  RTC 1 week  Vivi Barrack DPM

## 2020-10-16 ENCOUNTER — Ambulatory Visit (HOSPITAL_COMMUNITY): Payer: No Typology Code available for payment source | Admitting: Certified Registered Nurse Anesthetist

## 2020-10-16 ENCOUNTER — Other Ambulatory Visit: Payer: Self-pay

## 2020-10-16 ENCOUNTER — Ambulatory Visit (HOSPITAL_COMMUNITY)
Admission: RE | Admit: 2020-10-16 | Discharge: 2020-10-16 | Disposition: A | Discharge status: Home / Self Care | Payer: No Typology Code available for payment source | Attending: Cardiovascular Disease | Admitting: Cardiovascular Disease

## 2020-10-16 ENCOUNTER — Encounter (HOSPITAL_COMMUNITY): Payer: Self-pay | Admitting: Cardiovascular Disease

## 2020-10-16 ENCOUNTER — Encounter (HOSPITAL_COMMUNITY)
Admission: RE | Disposition: A | Discharge status: Home / Self Care | Payer: Self-pay | Source: Home / Self Care | Attending: Cardiovascular Disease

## 2020-10-16 DIAGNOSIS — I119 Hypertensive heart disease without heart failure: Secondary | ICD-10-CM | POA: Diagnosis not present

## 2020-10-16 DIAGNOSIS — Z7901 Long term (current) use of anticoagulants: Secondary | ICD-10-CM | POA: Insufficient documentation

## 2020-10-16 DIAGNOSIS — Z7984 Long term (current) use of oral hypoglycemic drugs: Secondary | ICD-10-CM | POA: Insufficient documentation

## 2020-10-16 DIAGNOSIS — I4891 Unspecified atrial fibrillation: Secondary | ICD-10-CM

## 2020-10-16 DIAGNOSIS — E119 Type 2 diabetes mellitus without complications: Secondary | ICD-10-CM | POA: Insufficient documentation

## 2020-10-16 DIAGNOSIS — Z79899 Other long term (current) drug therapy: Secondary | ICD-10-CM | POA: Diagnosis not present

## 2020-10-16 DIAGNOSIS — I4819 Other persistent atrial fibrillation: Secondary | ICD-10-CM | POA: Diagnosis present

## 2020-10-16 HISTORY — PX: CARDIOVERSION: SHX1299

## 2020-10-16 HISTORY — DX: Cardiac arrhythmia, unspecified: I49.9

## 2020-10-16 LAB — POCT I-STAT, CHEM 8
BUN: 23 mg/dL (ref 8–23)
Calcium, Ion: 1.22 mmol/L (ref 1.15–1.40)
Chloride: 100 mmol/L (ref 98–111)
Creatinine, Ser: 1 mg/dL (ref 0.61–1.24)
Glucose, Bld: 203 mg/dL — ABNORMAL HIGH (ref 70–99)
HCT: 38 % — ABNORMAL LOW (ref 39.0–52.0)
Hemoglobin: 12.9 g/dL — ABNORMAL LOW (ref 13.0–17.0)
Potassium: 4.1 mmol/L (ref 3.5–5.1)
Sodium: 140 mmol/L (ref 135–145)
TCO2: 25 mmol/L (ref 22–32)

## 2020-10-16 SURGERY — CARDIOVERSION
Anesthesia: General

## 2020-10-16 MED ORDER — SODIUM CHLORIDE 0.9 % IV SOLN: INTRAVENOUS | Status: DC | PRN

## 2020-10-16 MED ORDER — PROPOFOL 10 MG/ML IV BOLUS
INTRAVENOUS | Status: DC | PRN
  Administered 2020-10-16: 60 mg via INTRAVENOUS

## 2020-10-16 MED ORDER — LIDOCAINE HCL (CARDIAC) PF 100 MG/5ML IV SOSY
PREFILLED_SYRINGE | INTRAVENOUS | Status: DC | PRN
  Administered 2020-10-16: 60 mg via INTRATRACHEAL

## 2020-10-16 NOTE — Interval H&P Note (Signed)
History and Physical Interval Note:  10/16/2020 9:01 AM  Dean Rasmussen  has presented today for surgery, with the diagnosis of AFIB.  The various methods of treatment have been discussed with the patient and family. After consideration of risks, benefits and other options for treatment, the patient has consented to  Procedure(s): CARDIOVERSION (N/A) as a surgical intervention.  The patient's history has been reviewed, patient examined, no change in status, stable for surgery.  I have reviewed the patient's chart and labs.  Questions were answered to the patient's satisfaction.    DCCV for atrial fibrillation. On xarelto since September with no missed doses >3 weeks. NPO since midnight.   [name], MD [facility]  [address], [address] [place], [place] [address] [phone]  9:01 AM

## 2020-10-16 NOTE — Anesthesia Procedure Notes (Signed)
Procedure Name: General with mask airway Date/Time: 10/16/2020 10:06 AM Performed by: Modena Morrow, CRNA Pre-anesthesia Checklist: Patient identified, Emergency Drugs available, Suction available and Patient being monitored Patient Re-evaluated:Patient Re-evaluated prior to induction Oxygen Delivery Method: Ambu bag Preoxygenation: Pre-oxygenation with 100% oxygen Induction Type: IV induction Dental Injury: Teeth and Oropharynx as per pre-operative assessment

## 2020-10-16 NOTE — Anesthesia Preprocedure Evaluation (Addendum)
Anesthesia Evaluation  Patient identified by MRN, date of birth, ID band Patient awake    Reviewed: Allergy & Precautions, NPO status , Patient's Chart, lab work & pertinent test results, reviewed documented beta blocker date and time   History of Anesthesia Complications (+) PONV and history of anesthetic complications  Airway Mallampati: II  TM Distance: >3 FB Neck ROM: Full    Dental  (+) Dental Advisory Given   Pulmonary neg sleep apnea, neg COPD, neg recent URI,  Covid-19 Nucleic Acid Test Results Lab Results      Component                Value               Date                      SARSCOV2NAA              NEGATIVE            10/14/2020                SARSCOV2NAA              NEGATIVE            08/09/2020              breath sounds clear to auscultation       Cardiovascular hypertension, Pt. on medications and Pt. on home beta blockers + dysrhythmias Atrial Fibrillation  Rhythm:Irregular   1. Left ventricular ejection fraction, by estimation, is 55 to 60%. The  left ventricle has normal function. The left ventricle has no regional  wall motion abnormalities. There is severe concentric left ventricular  hypertrophy. Left ventricular diastolic  function could not be evaluated.  2. Right ventricular systolic function is normal. The right ventricular  size is normal. There is normal pulmonary artery systolic pressure.  3. The mitral valve is grossly normal. No evidence of mitral valve  regurgitation. No evidence of mitral stenosis.  4. The aortic valve is normal in structure. Aortic valve regurgitation is  not visualized. No aortic stenosis is present.    Neuro/Psych negative neurological ROS  negative psych ROS   GI/Hepatic negative GI ROS, Neg liver ROS,   Endo/Other  diabetes  Renal/GU negative Renal ROSLab Results      Component                Value               Date                      CREATININE                1.00                10/16/2020           Lab Results      Component                Value               Date                      K                        4.1  10/16/2020                Musculoskeletal   Abdominal   Peds  Hematology  (+) Blood dyscrasia, anemia , Lab Results      Component                Value               Date                      WBC                      8.7                 09/20/2020                HGB                      12.9 (L)            10/16/2020                HCT                      38.0 (L)            10/16/2020                MCV                      91                  09/20/2020                PLT                      265                 09/20/2020             xarelto for afib   Anesthesia Other Findings   Reproductive/Obstetrics                            Anesthesia Physical Anesthesia Plan  ASA: III  Anesthesia Plan: General   Post-op Pain Management:    Induction: Intravenous  PONV Risk Score and Plan: 2  Airway Management Planned: Mask  Additional Equipment: None  Intra-op Plan:   Post-operative Plan:   Informed Consent: I have reviewed the patients History and Physical, chart, labs and discussed the procedure including the risks, benefits and alternatives for the proposed anesthesia with the patient or authorized representative who has indicated his/her understanding and acceptance.     Dental advisory given  Plan Discussed with: CRNA and Surgeon  Anesthesia Plan Comments:         Anesthesia Quick Evaluation

## 2020-10-16 NOTE — Transfer of Care (Signed)
Immediate Anesthesia Transfer of Care Note  Patient: Dean Rasmussen  Procedure(s) Performed: CARDIOVERSION (N/A )  Patient Location: Endoscopy Unit  Anesthesia Type:General  Level of Consciousness: drowsy and patient cooperative  Airway & Oxygen Therapy: Patient Spontanous Breathing  Post-op Assessment: Report given to RN and Post -op Vital signs reviewed and stable  Post vital signs: Reviewed and stable  Last Vitals:  Vitals Value Taken Time  BP 167/72   Temp    Pulse 65   Resp    SpO2 95     Last Pain:  Vitals:   10/16/20 0902  TempSrc: Oral  PainSc: 0-No pain         Complications: No complications documented.

## 2020-10-16 NOTE — Discharge Instructions (Signed)

## 2020-10-16 NOTE — CV Procedure (Signed)
   DIRECT CURRENT CARDIOVERSION  NAME:  Dean Rasmussen    MRN: [id number] DOB:  27-Mar-1951    ADMIT DATE: 10/16/2020  Indication:  Symptomatic atrial fibrillation  Procedure Note:  The patient signed informed consent.  They have had had therapeutic anticoagulation with xarelto greater than 3 weeks.  Anesthesia was administered by Dr. Maple Hudson.  Adequate airway was maintained throughout and vital followed per protocol.  They were cardioverted x 1 with 200J of biphasic synchronized energy.  They converted to NSR.  There were no apparent complications.  The patient had normal neuro status and respiratory status post procedure with vitals stable as recorded elsewhere.    Follow up: They will continue on current medical therapy and follow up with cardiology as scheduled.  [name], MD [facility]  [address], [address] [place], [place] [address] [phone]  10:15 AM

## 2020-10-17 ENCOUNTER — Encounter (HOSPITAL_COMMUNITY): Payer: Self-pay | Admitting: Cardiovascular Disease

## 2020-10-20 NOTE — Anesthesia Postprocedure Evaluation (Signed)
Anesthesia Post Note  Patient: Dean Rasmussen  Procedure(s) Performed: CARDIOVERSION (N/A )     Patient location during evaluation: Endoscopy Anesthesia Type: General Level of consciousness: awake and alert Pain management: pain level controlled Vital Signs Assessment: post-procedure vital signs reviewed and stable Respiratory status: spontaneous breathing, nonlabored ventilation, respiratory function stable and patient connected to nasal cannula oxygen Cardiovascular status: stable Postop Assessment: no apparent nausea or vomiting Anesthetic complications: no   No complications documented.  Last Vitals:  Vitals:   10/16/20 1035 10/16/20 1040  BP:  (!) 170/95  Pulse: 62 65  Resp: 15 20  Temp:    SpO2: 100% 100%    Last Pain:  Vitals:   10/17/20 1527  TempSrc:   PainSc: 0-No pain                 Ulonda Klosowski

## 2020-10-21 ENCOUNTER — Other Ambulatory Visit: Payer: Self-pay

## 2020-10-21 ENCOUNTER — Ambulatory Visit (INDEPENDENT_AMBULATORY_CARE_PROVIDER_SITE_OTHER): Payer: No Typology Code available for payment source | Admitting: Podiatry

## 2020-10-21 DIAGNOSIS — L97513 Non-pressure chronic ulcer of other part of right foot with necrosis of muscle: Secondary | ICD-10-CM

## 2020-10-29 ENCOUNTER — Other Ambulatory Visit: Payer: Self-pay

## 2020-10-29 ENCOUNTER — Ambulatory Visit (INDEPENDENT_AMBULATORY_CARE_PROVIDER_SITE_OTHER): Payer: No Typology Code available for payment source | Admitting: Podiatry

## 2020-10-29 DIAGNOSIS — L97513 Non-pressure chronic ulcer of other part of right foot with necrosis of muscle: Secondary | ICD-10-CM

## 2020-10-29 NOTE — Progress Notes (Signed)
Subjective: Dean Rasmussen is a 69 y.o. is seen today in office s/p right foot incision and drainage, application of antibiotic beads the return to the operating room for wound closure, graft application which occurred on August 12, 2020 with Dr. Lilian Kapur.  He did soak his foot yesterday to getting the dry skin off.  The wound is doing much better and the scab is almost off on the top of the foot.  He denies any pain on his foot some wearing a regular shoe without any issues.  He did have to travel in [place] since I last saw him. Denies any fevers, chills, nausea, vomiting.  No calf pain, chest pain, shortness of breath.   Objective: General: No acute distress, AAOx3 -presents with son today DP/PT pulses palpable 2/4, CRT < 3 sec to all digits.  RIGHT foot: Ulceration of the dorsal aspect of the right foot measures smaller at 4 x 2 cm with a depth of 0.1 cm and superficial with a granular wound base.  No probing, undermining or tunneling.  No surrounding erythema, ascending cellulitis there is no fluctuation crepitation.  There is no malodor.  Scab the dorsal midfoot came off today and otherwise there is no open lesion identified. No pain with calf compression, swelling, warmth, erythema.   Assessment and Plan:  Status post right foot surgery; right foot ulceration  -Treatment options discussed including all alternatives, risks, and complications -Sharply debrided the wound edges utilizing the 312 with scalpel down to healthy, bleeding, viable tissue treatment to remove devitalized nonviable tissue.  The wound was cleaned.  Adaptic followed by dressing was applied.  Continue local wound care patient to continue elevation, nonweightbearing for now. -Discussed to wash the foot with soap and water daily. -Surgical shoe dispensed.  Discussed weightbearing as tolerated for short amount of time. -Monitor for any clinical signs or symptoms of infection and directed to call the office immediately  should any occur or go to the ER.  RTC 1 week  Vivi Barrack DPM

## 2020-10-29 NOTE — Progress Notes (Signed)
Subjective: Dean Rasmussen is a 69 y.o. is seen today in office s/p right foot incision and drainage, application of antibiotic beads the return to the operating room for wound closure, graft application which occurred on August 12, 2020 with Dr. Lilian Kapur.  His wife has been changing the bandage daily and feels that the wound is getting better.  Denies any drainage or pus.  No increase in swelling or redness.  No pain.  No other concerns today. Denies any fevers, chills, nausea, vomiting.  No calf pain, chest pain, shortness of breath.   Objective: General: No acute distress, AAOx3 -presents with son today DP/PT pulses palpable 2/4, CRT < 3 sec to all digits.  RIGHT foot: Ulceration of the dorsal aspect of the right foot measures smaller at 4 x 2.5 cm with a depth of 0.1 cm and superficial with a granular wound base.  No probing, undermining or tunneling.  No surrounding erythema, ascending cellulitis there is no fluctuation crepitation.  There is no malodor.  On the dorsal midfoot is a scab present.  There is no drainage or pus.  Dry skin is present to the plantar foot.  No pain with calf compression, swelling, warmth, erythema.   Assessment and Plan:  Status post right foot surgery; right foot ulceration  -Treatment options discussed including all alternatives, risks, and complications -Sharply debrided the wound edges utilizing the 312 with scalpel down to healthy, bleeding, viable tissue treatment to remove devitalized nonviable tissue.  The wound was cleaned.  Adaptic followed by dressing was applied.  The wound is continued to improve so would hold off on further grafting.  Patient to continue elevation, nonweightbearing for now. -Discussed to wash the foot with soap and water daily. -Monitor for any clinical signs or symptoms of infection and directed to call the office immediately should any occur or go to the ER.  RTC 1 week  Vivi Barrack DPM

## 2020-10-31 ENCOUNTER — Encounter (HOSPITAL_COMMUNITY): Payer: No Typology Code available for payment source

## 2020-10-31 ENCOUNTER — Encounter: Payer: No Typology Code available for payment source | Admitting: Vascular Surgery

## 2020-11-01 DIAGNOSIS — I499 Cardiac arrhythmia, unspecified: Secondary | ICD-10-CM | POA: Insufficient documentation

## 2020-11-07 ENCOUNTER — Encounter: Payer: No Typology Code available for payment source | Admitting: Vascular Surgery

## 2020-11-08 ENCOUNTER — Ambulatory Visit (INDEPENDENT_AMBULATORY_CARE_PROVIDER_SITE_OTHER): Payer: No Typology Code available for payment source | Admitting: Podiatry

## 2020-11-08 ENCOUNTER — Other Ambulatory Visit: Payer: Self-pay

## 2020-11-08 DIAGNOSIS — L97513 Non-pressure chronic ulcer of other part of right foot with necrosis of muscle: Secondary | ICD-10-CM | POA: Diagnosis not present

## 2020-11-08 DIAGNOSIS — M86072 Acute hematogenous osteomyelitis, left ankle and foot: Secondary | ICD-10-CM

## 2020-11-08 NOTE — Progress Notes (Signed)
Subjective: Baruch Lewers is a 69 y.o. is seen today in office s/p right foot incision and drainage, application of antibiotic beads the return to the operating room for wound closure, graft application which occurred on August 12, 2020 with Dr. Lilian Kapur.  He does admit that he had to travel back to [place] again this week he was on his feet more.  Some increased drainage, bloody drainage coming from the wound but no pus.  Some occasional discomfort in has been putting some weight on her foot but otherwise been doing well.  No recent injury or falls. Denies any fevers, chills, nausea, vomiting.  No calf pain, chest pain, shortness of breath.   Objective: General: No acute distress, AAOx3 -presents with son today DP/PT pulses palpable 2/4, CRT < 3 sec to all digits.  RIGHT foot: Ulceration of the dorsal aspect of the right foot measures smaller after debridement measuring 4 x 1.6 cm with a depth of 0.1 cm and superficial with a granular wound base.  Wound proximally is 1.6 cm in width more narrow distally.  Biofilm over the wound present.  No probing, undermining or tunneling.  No surrounding erythema, ascending cellulitis there is no fluctuation crepitation.  There is no malodor.  Scab the dorsal midfoot came off today and otherwise there is no open lesion identified. No pain with calf compression, swelling, warmth, erythema.   Assessment and Plan:  Status post right foot surgery; right foot ulceration  -Treatment options discussed including all alternatives, risks, and complications -Sharply debrided the wound edges utilizing the 312 with scalpel down to healthy, bleeding, viable tissue treatment to remove devitalized nonviable tissue.  The wound was cleaned.  Adaptic followed by dressing was applied.  Continue local wound care patient to continue elevation, nonweightbearing for now. -X-rays ordered -Discussed to wash the foot with soap and water daily. -Surgical shoe dispensed.  Discussed  weightbearing as tolerated for short amount of time. -Monitor for any clinical signs or symptoms of infection and directed to call the office immediately should any occur or go to the ER.  RTC 1 week  Vivi Barrack DPM

## 2020-11-11 NOTE — Progress Notes (Signed)
Cardiology Office Note:    Date:  11/12/2020   ID:  Dean Rasmussen, DOB Apr 04, 1951, MRN [id number]  PCP:  Patient, [name]  Cardiologist:  Norman Herrlich, MD    Referring MD: No ref. provider found    ASSESSMENT:    1. Paroxysmal atrial fibrillation (HCC)   2. Chronic anticoagulation   3. Hypertensive heart disease without heart failure    PLAN:    In order of problems listed above:  1. Stable in sinus rhythm hold on a antiarrhythmic drug continue beta-blocker and anticoagulant. 2. Stable continue beta-blocker as needed ARB if needed monitor blood pressure at home and monitor his heart rhythm daily 3. Diabetes poorly controlled with an additional agent is needed either SGLT2 inhibitor or GLP-1 agonist would be appropriate for cardiovascular prophylaxis   Next appointment: 3 months   Medication Adjustments/Labs and Tests Ordered: Current medicines are reviewed at length with the patient today.  Concerns regarding medicines are outlined above.  Orders Placed This Encounter  Procedures  . EKG 12-Lead   Meds ordered this encounter  Medications  . losartan (COZAAR) 50 MG tablet    Sig: Take 1 tablet (50 mg total) by mouth daily.    Dispense:  90 tablet    Refill:  3  . rivaroxaban (XARELTO) 20 MG TABS tablet    Sig: Take 1 tablet (20 mg total) by mouth daily with supper.    Dispense:  90 tablet    Refill:  2    No chief complaint on file.   History of Present Illness:    Dean Rasmussen is a 69 y.o. male with a hx of atrial fibrillation type 2 diabetes hypertension hypothyroidism frequent ventricular and supraventricular ectopy hypertensive heart disease with left ventricular hypertrophy moderate on echocardiogram in 2018 last seen 09/20/2020 prior to cardioversion.  On 10/16/2020 he was cardioverted to sinus rhythm with single application 200 J.  His echocardiogram 08/14/2020 showed an ejection fraction of 55 to 60% with severe concentric LVH and normal left ventricular  volume.  Fortunately both atria were normal in size. Compliance with diet, lifestyle and medications: Yes  Your very nice response to cardioversion he feels back to normal not short of breath no edema chest pain palpitation.  They have the adapter for his smart phone and will monitor his rhythm at home.  We discussed an antiarrhythmic drug I think the best 1 for him will be amiodarone his wife is a nurse does not want to take it is working to do close observation and if he has recurrence referral for EP catheter ablation.  You remain anticoagulated.  He has had a few blood pressures elevated greater than 90 diastolic and be given a prescription for as needed low-dose ARB. Past Medical History:  Diagnosis Date  . Abscess of right foot   . Atrial fibrillation (HCC) 08/14/2020  . Charcot's arthropathy 08/14/2020  . Diabetes mellitus without complication (HCC)   . Diabetic neuropathy (HCC) 08/14/2020  . Dysrhythmia   . Essential hypertension 08/24/2019  . Hypertension   . Hypertensive heart disease 08/24/2019  . Hypertensive left ventricular hypertrophy, without heart failure 08/24/2019  . Normocytic anemia 08/14/2020  . Open wound of right foot with complication   . Pain in right arm 05/22/2018  . Type 2 diabetes mellitus without complication, without long-term current use of insulin (HCC) 08/24/2019  . Ventricular ectopy 08/24/2019    Past Surgical History:  Procedure Laterality Date  . CARDIOVERSION N/A 10/16/2020   Procedure: CARDIOVERSION;  Surgeon: Sande Rives, MD;  Location: [facility] ENDOSCOPY;  Service: Cardiovascular;  Laterality: N/A;  . FOOT SURGERY Right 07/2020  . I & D EXTREMITY Right 08/09/2020   Procedure: IRRIGATION AND DEBRIDEMENT OF FOOT WITH IMPLANTATION OF ANTIBIOTIC CEMENT BEADS;  Surgeon: Edwin Cap, DPM;  Location: [facility] OR;  Service: Podiatry;  Laterality: Right;  . IRRIGATION AND DEBRIDEMENT FOOT Right 08/09/2020   IRRIGATION AND DEBRIDEMENT OF FOOT WITH IMPLANTATION  OF ANTIBIOTIC CEMENT BEADS (Right Foot)  . IRRIGATION AND DEBRIDEMENT FOOT Right 08/12/2020   Procedure: Secondary wound closure and removal of antibiotic beads;  Surgeon: Edwin Cap, DPM;  Location: [facility] OR;  Service: Podiatry;  Laterality: Right;  . ROTATOR CUFF REPAIR      Current Medications: Current Meds  Medication Sig  . acetaminophen (TYLENOL) 500 MG tablet Take 1,000 mg by mouth every 6 (six) hours as needed (pain).  Marland Kitchen glimepiride (AMARYL) 4 MG tablet Take 1 tablet (4 mg total) by mouth daily with supper.  . levothyroxine (SYNTHROID) 25 MCG tablet Take 1 tablet (25 mcg total) by mouth daily before breakfast.  . MAGNESIUM MALATE PO Take 1-2 tablets by mouth See admin instructions. 2 tablets in the evening.  . metFORMIN (GLUCOPHAGE) 1000 MG tablet Take 2 tablets (2,000 mg total) by mouth daily with supper.  . metoprolol succinate (TOPROL-XL) 100 MG 24 hr tablet Take 1 tablet (100 mg total) by mouth daily.  . Probiotic Product (PROBIOTIC PO) Take 1 capsule by mouth in the morning and at bedtime.  . [DISCONTINUED] rivaroxaban (XARELTO) 20 MG TABS tablet Take 1 tablet (20 mg total) by mouth daily with supper.     Allergies:   Patient has no known allergies.   Social History   Socioeconomic History  . Marital status: Married    Spouse name: Not on file  . Number of children: Not on file  . Years of education: Not on file  . Highest education level: Not on file  Occupational History  . Not on file  Tobacco Use  . Smoking status: Never Smoker  . Smokeless tobacco: Never Used  Vaping Use  . Vaping Use: Never used  Substance and Sexual Activity  . Alcohol use: Yes    Comment: occasional  . Drug use: Never  . Sexual activity: Yes  Other Topics Concern  . Not on file  Social History Narrative  . Not on file   Social Determinants of Health   Financial Resource Strain: Not on file  Food Insecurity: Not on file  Transportation Needs: Not on file  Physical Activity: Not  on file  Stress: Not on file  Social Connections: Not on file     Family History: The patient's family history includes Atrial fibrillation in his mother; Bladder Cancer in his father; CAD in his brother; Diabetes in his brother and mother; Heart attack in his brother; Heart disease in his mother; Other in his sister. ROS:   Please see the history of present illness.    All other systems reviewed and are negative.  EKGs/Labs/Other Studies Reviewed:    The following studies were reviewed today:  EKG:  EKG ordered today and personally reviewed.  The ekg ordered today demonstrates sinus rhythm normal EKG  Recent Labs: 08/09/2020: TSH 0.892 08/10/2020: ALT 16 09/20/2020: Magnesium 1.6; Platelets 265 10/16/2020: BUN 23; Creatinine, Ser 1.00; Hemoglobin 12.9; Potassium 4.1; Sodium 140  Recent Lipid Panel No results found for: CHOL, TRIG, HDL, CHOLHDL, VLDL, LDLCALC, LDLDIRECT  Physical Exam:  VS:  BP 120/62   Pulse 69   Ht 5\' 10"  (1.778 m)   Wt 180 lb 1.3 oz (81.7 kg)   SpO2 99%   BMI 25.84 kg/m     Wt Readings from Last 3 Encounters:  11/12/20 180 lb 1.3 oz (81.7 kg)  10/16/20 173 lb (78.5 kg)  10/08/20 173 lb 9.6 oz (78.7 kg)     GEN:  Well nourished, well developed in no acute distress HEENT: Normal NECK: No JVD; No carotid bruits LYMPHATICS: No lymphadenopathy CARDIAC: RRR, no murmurs, rubs, gallops RESPIRATORY:  Clear to auscultation without rales, wheezing or rhonchi  ABDOMEN: Soft, non-tender, non-distended MUSCULOSKELETAL:  No edema; No deformity  SKIN: Warm and dry NEUROLOGIC:  Alert and oriented x 3 PSYCHIATRIC:  Normal affect    Signed, 13/09/21, MD  11/12/2020 8:53 AM     [facility]

## 2020-11-12 ENCOUNTER — Encounter: Payer: Self-pay | Admitting: Cardiology

## 2020-11-12 ENCOUNTER — Ambulatory Visit (INDEPENDENT_AMBULATORY_CARE_PROVIDER_SITE_OTHER): Payer: No Typology Code available for payment source | Admitting: Cardiology

## 2020-11-12 ENCOUNTER — Other Ambulatory Visit: Payer: Self-pay

## 2020-11-12 VITALS — BP 120/62 | HR 69 | Ht 70.0 in | Wt 180.1 lb

## 2020-11-12 DIAGNOSIS — I119 Hypertensive heart disease without heart failure: Secondary | ICD-10-CM | POA: Diagnosis not present

## 2020-11-12 DIAGNOSIS — I480 Paroxysmal atrial fibrillation: Secondary | ICD-10-CM

## 2020-11-12 DIAGNOSIS — Z7901 Long term (current) use of anticoagulants: Secondary | ICD-10-CM

## 2020-11-12 MED ORDER — LOSARTAN POTASSIUM 50 MG PO TABS: 50.0000 mg | ORAL_TABLET | Freq: Every day | ORAL | 3 refills | Status: DC

## 2020-11-12 MED ORDER — RIVAROXABAN 20 MG PO TABS: 20.0000 mg | ORAL_TABLET | Freq: Every day | ORAL | 2 refills | Status: DC

## 2020-11-12 NOTE — Patient Instructions (Signed)
Medication Instructions:  Your physician has recommended you make the following change in your medication:  START: Losartan 50 mg take one tablet by mouth daily only if your blood pressure is above 1610/90. *If you need a refill on your cardiac medications before your next appointment, please call your pharmacy*   Lab Work: None If you have labs (blood work) drawn today and your tests are completely normal, you will receive your results only by: Marland Kitchen MyChart Message (if you have MyChart) OR . A paper copy in the mail If you have any lab test that is abnormal or we need to change your treatment, we will call you to review the results.   Testing/Procedures: None   Follow-Up: At [facility], you and your health needs are our priority.  As part of our continuing mission to provide you with exceptional heart care, we have created designated Provider Care Teams.  These Care Teams include your primary Cardiologist (physician) and Advanced Practice Providers (APPs -  Physician Assistants and Nurse Practitioners) who all work together to provide you with the care you need, when you need it.  We recommend signing up for the patient portal called "MyChart".  Sign up information is provided on this After Visit Summary.  MyChart is used to connect with patients for Virtual Visits (Telemedicine).  Patients are able to view lab/test results, encounter notes, upcoming appointments, etc.  Non-urgent messages can be sent to your provider as well.   To learn more about what you can do with MyChart, go to [email or web address].    Your next appointment:   3 month(s)  The format for your next appointment:   In Person  Provider:   Norman Herrlich, MD   Other Instructions

## 2020-11-13 ENCOUNTER — Ambulatory Visit (INDEPENDENT_AMBULATORY_CARE_PROVIDER_SITE_OTHER): Payer: No Typology Code available for payment source

## 2020-11-13 DIAGNOSIS — L97513 Non-pressure chronic ulcer of other part of right foot with necrosis of muscle: Secondary | ICD-10-CM | POA: Diagnosis not present

## 2020-11-13 DIAGNOSIS — M86072 Acute hematogenous osteomyelitis, left ankle and foot: Secondary | ICD-10-CM

## 2020-11-15 ENCOUNTER — Ambulatory Visit (INDEPENDENT_AMBULATORY_CARE_PROVIDER_SITE_OTHER): Payer: No Typology Code available for payment source | Admitting: Podiatry

## 2020-11-15 DIAGNOSIS — L97513 Non-pressure chronic ulcer of other part of right foot with necrosis of muscle: Secondary | ICD-10-CM | POA: Diagnosis not present

## 2020-11-16 NOTE — Progress Notes (Signed)
Subjective: Dean Rasmussen is a 69 y.o. is seen today in office s/p right foot incision and drainage, application of antibiotic beads the return to the operating room for wound closure, graft application which occurred on August 12, 2020 with Dr. Lilian Kapur.  He does ambulate with a surgical shoe but he is on his foot a lot he will use the knee scooter.  Currently denies any fevers, chills, nausea, vomiting.  No calf pain, chest pain, shortness of breath.   Objective: General: No acute distress, AAOx3 -presents with son today DP/PT pulses palpable 2/4, CRT < 3 sec to all digits.  RIGHT foot: Ulceration of the dorsal aspect of the right foot measures smaller after debridement measuring 3 x 1.4 cm with a depth of 0.1 cm and superficial with a granular wound base. No probing, undermining or tunneling.  No surrounding erythema, ascending cellulitis there is no fluctuation crepitation.  There is no malodor.  Scab the dorsal midfoot came off today and otherwise there is no open lesion identified. No pain with calf compression, swelling, warmth, erythema.   Assessment and Plan:  Right foot ulcer  -Treatment options discussed including all alternatives, risks, and complications -Sharply debrided the wound edges utilizing the 312 with scalpel down to healthy, bleeding, viable tissue treatment to remove devitalized nonviable tissue.  The wound was cleaned.  Small mount of Betadine was applied to the wound.  Adaptic followed by dressing was applied.  Continue local wound care patient to continue elevation, nonweightbearing for now. -Reviewed x-rays -Discussed to wash the foot with soap and water daily. -Surgical shoe dispensed.  Discussed weightbearing as tolerated for short amount of time. -Monitor for any clinical signs or symptoms of infection and directed to call the office immediately should any occur or go to the ER.  RTC 10 days  Vivi Barrack DPM

## 2020-11-25 ENCOUNTER — Ambulatory Visit (INDEPENDENT_AMBULATORY_CARE_PROVIDER_SITE_OTHER): Payer: No Typology Code available for payment source | Admitting: Podiatry

## 2020-11-25 ENCOUNTER — Other Ambulatory Visit: Payer: Self-pay

## 2020-11-25 DIAGNOSIS — L97513 Non-pressure chronic ulcer of other part of right foot with necrosis of muscle: Secondary | ICD-10-CM | POA: Diagnosis not present

## 2020-11-25 NOTE — Progress Notes (Signed)
Subjective: Dean Rasmussen is a 69 y.o. is seen today in office s/p right foot incision and drainage, application of antibiotic beads the return to the operating room for wound closure, graft application which occurred on August 12, 2020 with Dr. Lilian Kapur.  His wife has been changing the bandage daily with a small amount of Betadine.  Denies any drainage or pus otherwise no increased swelling or redness or red streaks.  He has had some chronic swelling since the surgery but has not worsened.  Currently denies any fevers, chills, nausea, vomiting.  No calf pain, chest pain, shortness of breath.   Objective: General: No acute distress, AAOx3 -presents with son today DP/PT pulses palpable 2/4, CRT < 3 sec to all digits.  RIGHT foot: Ulceration of the dorsal aspect of the right foot measures smaller after debridement measuring 3 x 0.8 cm with a depth of 0.1 cm and superficial with a granular wound base.  The wound is more narrow inferiorly and is the widest at the 0.8 cm superiorly.  No probing, undermining or tunneling.  No surrounding erythema, ascending cellulitis there is no fluctuation crepitation.  There is no malodor.  Scab the dorsal midfoot came off today and otherwise there is no open lesion identified. No pain with calf compression, swelling, warmth, erythema.   Assessment and Plan:  Right foot ulcer  -Treatment options discussed including all alternatives, risks, and complications -Sharply debrided the wound edges utilizing the 312 with scalpel down to healthy, bleeding, viable tissue treatment to remove devitalized nonviable tissue.  The wound was cleaned.  Small mount of Betadine was applied to the wound.  Nonadherent dressing followed by dressing was applied.  Continue local wound care patient to continue elevation, nonweightbearing for now. -Discussed to wash the foot with soap and water daily. -Monitor for any clinical signs or symptoms of infection and directed to call the office  immediately should any occur or go to the ER.  RTC 10 days  Vivi Barrack DPM

## 2020-12-04 ENCOUNTER — Other Ambulatory Visit: Payer: Self-pay | Admitting: *Deleted

## 2020-12-04 DIAGNOSIS — S91301S Unspecified open wound, right foot, sequela: Secondary | ICD-10-CM

## 2020-12-06 ENCOUNTER — Ambulatory Visit (INDEPENDENT_AMBULATORY_CARE_PROVIDER_SITE_OTHER): Payer: No Typology Code available for payment source | Admitting: Podiatry

## 2020-12-06 ENCOUNTER — Other Ambulatory Visit: Payer: Self-pay

## 2020-12-06 DIAGNOSIS — L03119 Cellulitis of unspecified part of limb: Secondary | ICD-10-CM

## 2020-12-06 DIAGNOSIS — L97513 Non-pressure chronic ulcer of other part of right foot with necrosis of muscle: Secondary | ICD-10-CM | POA: Diagnosis not present

## 2020-12-06 DIAGNOSIS — L02619 Cutaneous abscess of unspecified foot: Secondary | ICD-10-CM

## 2020-12-06 MED ORDER — SULFAMETHOXAZOLE-TRIMETHOPRIM 800-160 MG PO TABS: 1.0000 | ORAL_TABLET | Freq: Two times a day (BID) | ORAL | 0 refills | Status: DC

## 2020-12-06 NOTE — Progress Notes (Signed)
Subjective: Dean Rasmussen is a 70 y.o. is seen today in office s/p right foot incision and drainage, application of antibiotic beads the return to the operating room for wound closure, graft application which occurred on August 12, 2020 with Dr. Lilian Kapur.  His wife has been changing the bandage daily and he states that his wife "debrides" it to make it bleed.  He has noticed some swelling but also states he has been on his feet more.  Currently denies any fevers, chills, nausea, vomiting.  No calf pain, chest pain, shortness of breath.   Objective: General: No acute distress, AAOx3 -presents alone wearing regular shoe with knee scooter DP/PT pulses palpable 2/4, CRT < 3 sec to all digits.  RIGHT foot: Ulceration of the dorsal aspect of the right foot measures smaller after debridement measuring 2 x 0.8 cm with a depth of 0.1 cm and with a granular wound base.  There is localized edema and erythema on the wound extends approximately 1 cm from the wound.  No ascending cellulitis.  No fluctuation, crepitation, malodor.  He does describe some swelling to the leg previously this week but this has resolved.  There is no pain with calf compression, erythema or warmth. No pain with calf compression, swelling, warmth, erythema.   Assessment and Plan:  Right foot ulcer-localized cellulitis  -Treatment options discussed including all alternatives, risks, and complications -Sharply debrided the wound edges utilizing the 312 with scalpel down to healthy, bleeding, viable tissue treatment to remove devitalized nonviable tissue.  The wound was cleaned.  Small mount of Silvadene was applied to the wound.  Nonadherent dressing followed by dressing was applied.  Continue local wound care patient to continue elevation, nonweightbearing for now. -Given the localized cellulitis prescribed Bactrim. -Encouraged him to wear surgical shoe and stay nonweightbearing this week and continue elevation. -Monitor for any  clinical signs or symptoms of infection and directed to call the office immediately should any occur or go to the ER.  RTC 10 days  Vivi Barrack DPM

## 2020-12-13 ENCOUNTER — Other Ambulatory Visit: Payer: Self-pay

## 2020-12-13 ENCOUNTER — Encounter: Payer: Self-pay | Admitting: Podiatry

## 2020-12-13 ENCOUNTER — Ambulatory Visit (INDEPENDENT_AMBULATORY_CARE_PROVIDER_SITE_OTHER): Payer: No Typology Code available for payment source | Admitting: Podiatry

## 2020-12-13 DIAGNOSIS — L97513 Non-pressure chronic ulcer of other part of right foot with necrosis of muscle: Secondary | ICD-10-CM

## 2020-12-13 MED ORDER — METOPROLOL SUCCINATE ER 100 MG PO TB24
100.0000 mg | ORAL_TABLET | Freq: Every day | ORAL | 0 refills | Status: DC
Start: 2020-12-13 — End: 2021-03-07

## 2020-12-17 MED ORDER — RIVAROXABAN 20 MG PO TABS: 20.0000 mg | ORAL_TABLET | Freq: Every day | ORAL | 2 refills | Status: DC

## 2020-12-17 NOTE — Progress Notes (Signed)
Subjective: Dean Rasmussen is a 70 y.o. is seen today in office s/p right foot incision and drainage, application of antibiotic beads the return to the operating room for wound closure, graft application which occurred on August 12, 2020 with Dr. Lilian Kapur.  States the wound is doing much better.  He has not seen any pus.  Has occasional bleeding.  The swelling and the redness that he had is also improved.  He does get some tenderness which is been chronic around the surgical sites been ongoing since surgery.  No recent injury or falls. Currently denies any fevers, chills, nausea, vomiting.  No calf pain, chest pain, shortness of breath.   Objective: General: No acute distress, AAOx3 -presents alone wearing regular shoe with knee scooter DP/PT pulses palpable 2/4, CRT < 3 sec to all digits.  RIGHT foot: Ulceration of the dorsal aspect of the right foot measures smaller after debridement measuring 1 x 0.5 cm with a depth of 0.1 cm and with a granular wound base.  Localized edema and erythema has much improved.  There is some mild tenderness palpation on plantar aspect of the midfoot but there is a long area of bony prominence.  There is no area of fluctuation, crepitation.  Is no malodor.  No ascending cellulitis.   No pain with calf compression, swelling, warmth, erythema.   Assessment and Plan:  Right foot ulcer-localized cellulitis  -Treatment options discussed including all alternatives, risks, and complications -Sharply debrided the wound edges utilizing the 312 with scalpel down to healthy, bleeding, viable tissue treatment to remove devitalized nonviable tissue.  The wound was cleaned.  Small mount of Silvadene was applied to the wound.  Nonadherent dressing followed by dressing was applied.  Continue local wound care patient to continue elevation, nonweightbearing for now. -Finish course of Bactrim. -Encouraged elevation.  -Monitor for any clinical signs or symptoms of infection and directed  to call the office immediately should any occur or go to the ER.  Vivi Barrack DPM

## 2020-12-19 ENCOUNTER — Ambulatory Visit: Payer: No Typology Code available for payment source | Admitting: Orthotics

## 2020-12-19 ENCOUNTER — Ambulatory Visit (INDEPENDENT_AMBULATORY_CARE_PROVIDER_SITE_OTHER): Payer: No Typology Code available for payment source | Admitting: Podiatry

## 2020-12-19 ENCOUNTER — Encounter: Payer: Self-pay | Admitting: Vascular Surgery

## 2020-12-19 ENCOUNTER — Ambulatory Visit (INDEPENDENT_AMBULATORY_CARE_PROVIDER_SITE_OTHER): Payer: No Typology Code available for payment source | Admitting: Vascular Surgery

## 2020-12-19 ENCOUNTER — Encounter: Payer: Self-pay | Admitting: Podiatry

## 2020-12-19 ENCOUNTER — Ambulatory Visit (HOSPITAL_COMMUNITY)
Admission: RE | Admit: 2020-12-19 | Discharge: 2020-12-19 | Disposition: A | Discharge status: Home / Self Care | Payer: No Typology Code available for payment source | Source: Ambulatory Visit | Attending: Vascular Surgery | Admitting: Vascular Surgery

## 2020-12-19 ENCOUNTER — Other Ambulatory Visit: Payer: Self-pay

## 2020-12-19 VITALS — BP 119/75 | HR 63 | Temp 97.8°F | Resp 20 | Ht 70.0 in | Wt 183.5 lb

## 2020-12-19 DIAGNOSIS — L97513 Non-pressure chronic ulcer of other part of right foot with necrosis of muscle: Secondary | ICD-10-CM

## 2020-12-19 DIAGNOSIS — I10 Essential (primary) hypertension: Secondary | ICD-10-CM | POA: Diagnosis not present

## 2020-12-19 DIAGNOSIS — S91301S Unspecified open wound, right foot, sequela: Secondary | ICD-10-CM | POA: Insufficient documentation

## 2020-12-19 DIAGNOSIS — S91301D Unspecified open wound, right foot, subsequent encounter: Secondary | ICD-10-CM

## 2020-12-19 DIAGNOSIS — M86171 Other acute osteomyelitis, right ankle and foot: Secondary | ICD-10-CM

## 2020-12-19 DIAGNOSIS — R609 Edema, unspecified: Secondary | ICD-10-CM

## 2020-12-19 DIAGNOSIS — X58XXXS Exposure to other specified factors, sequela: Secondary | ICD-10-CM | POA: Diagnosis not present

## 2020-12-19 DIAGNOSIS — I739 Peripheral vascular disease, unspecified: Secondary | ICD-10-CM

## 2020-12-19 DIAGNOSIS — M86072 Acute hematogenous osteomyelitis, left ankle and foot: Secondary | ICD-10-CM

## 2020-12-19 DIAGNOSIS — L03119 Cellulitis of unspecified part of limb: Secondary | ICD-10-CM

## 2020-12-19 DIAGNOSIS — L02619 Cutaneous abscess of unspecified foot: Secondary | ICD-10-CM

## 2020-12-19 DIAGNOSIS — E119 Type 2 diabetes mellitus without complications: Secondary | ICD-10-CM | POA: Diagnosis not present

## 2020-12-19 NOTE — Progress Notes (Signed)
Patient was evaluated upon the recommedation of Dr. Ardelle Anton for casting wound medial superior proximal  aspect of foot.  Since wound is more prominent medially rather than plantarly, it was decided best possible outcome/protection would be from a mezzo type brace with "bubble" offloading to provide soft interface between skin and shoe

## 2020-12-19 NOTE — Progress Notes (Signed)
Referring Physician: Dr. Dulce Sellar  Patient name: Dean Rasmussen MRN: [id number] DOB: 10-25-1951 Sex: male  REASON FOR CONSULT: Rule out peripheral arterial disease  HPI: Dean Rasmussen is a 70 y.o. male, with a known history of diabetes and Charcot changes in the right foot.  He had an abscess in his right foot several months ago and required hospitalization.  This has been slowly healing.  He states that currently he has no drainage.  There is some gray-blue discoloration of the skin around where the abscess was previously.  This was in the right foot.  He does not describe claudication.  He does not smoke.  Other medical problems include atrial fibrillation on Xarelto, neuropathy, hypertension, all of which have been stable.  Past Medical History:  Diagnosis Date  . Abscess of right foot   . Atrial fibrillation (HCC) 08/14/2020  . Charcot's arthropathy 08/14/2020  . Diabetes mellitus without complication (HCC)   . Diabetic neuropathy (HCC) 08/14/2020  . Dysrhythmia   . Essential hypertension 08/24/2019  . Hypertension   . Hypertensive heart disease 08/24/2019  . Hypertensive left ventricular hypertrophy, without heart failure 08/24/2019  . Normocytic anemia 08/14/2020  . Open wound of right foot with complication   . Pain in right arm 05/22/2018  . Type 2 diabetes mellitus without complication, without long-term current use of insulin (HCC) 08/24/2019  . Ventricular ectopy 08/24/2019   Past Surgical History:  Procedure Laterality Date  . CARDIOVERSION N/A 10/16/2020   Procedure: CARDIOVERSION;  Surgeon: Sande Rives, MD;  Location: [facility] ENDOSCOPY;  Service: Cardiovascular;  Laterality: N/A;  . FOOT SURGERY Right 07/2020  . I & D EXTREMITY Right 08/09/2020   Procedure: IRRIGATION AND DEBRIDEMENT OF FOOT WITH IMPLANTATION OF ANTIBIOTIC CEMENT BEADS;  Surgeon: Edwin Cap, DPM;  Location: [facility] OR;  Service: Podiatry;  Laterality: Right;  . IRRIGATION AND DEBRIDEMENT FOOT Right  08/09/2020   IRRIGATION AND DEBRIDEMENT OF FOOT WITH IMPLANTATION OF ANTIBIOTIC CEMENT BEADS (Right Foot)  . IRRIGATION AND DEBRIDEMENT FOOT Right 08/12/2020   Procedure: Secondary wound closure and removal of antibiotic beads;  Surgeon: Edwin Cap, DPM;  Location: [facility] OR;  Service: Podiatry;  Laterality: Right;  . ROTATOR CUFF REPAIR      Family History  Problem Relation Age of Onset  . Heart disease Mother   . Atrial fibrillation Mother   . Diabetes Mother   . Bladder Cancer Father   . Other Sister        No known health problems  . Heart attack Brother   . CAD Brother   . Diabetes Brother     SOCIAL HISTORY: Social History   Socioeconomic History  . Marital status: Married    Spouse name: Not on file  . Number of children: Not on file  . Years of education: Not on file  . Highest education level: Not on file  Occupational History  . Not on file  Tobacco Use  . Smoking status: Never Smoker  . Smokeless tobacco: Never Used  Vaping Use  . Vaping Use: Never used  Substance and Sexual Activity  . Alcohol use: Yes    Comment: occasional  . Drug use: Never  . Sexual activity: Yes  Other Topics Concern  . Not on file  Social History Narrative  . Not on file   Social Determinants of Health   Financial Resource Strain: Not on file  Food Insecurity: Not on file  Transportation Needs: Not on file  Physical Activity:  Not on file  Stress: Not on file  Social Connections: Not on file  Intimate Partner Violence: Not on file    No Known Allergies  Current Outpatient Medications  Medication Sig Dispense Refill  . acetaminophen (TYLENOL) 500 MG tablet Take 1,000 mg by mouth every 6 (six) hours as needed (pain).    Marland Kitchen losartan (COZAAR) 50 MG tablet Take 1 tablet (50 mg total) by mouth daily. (Patient taking differently: Take 50 mg by mouth daily as needed.) 90 tablet 3  . MAGNESIUM MALATE PO Take 1-2 tablets by mouth See admin instructions. 2 tablets in the evening.     . metoprolol succinate (TOPROL-XL) 100 MG 24 hr tablet Take 1 tablet (100 mg total) by mouth daily. 90 tablet 0  . Probiotic Product (PROBIOTIC PO) Take 1 capsule by mouth in the morning and at bedtime.    . rivaroxaban (XARELTO) 20 MG TABS tablet Take 1 tablet (20 mg total) by mouth daily with supper. 90 tablet 2  . glimepiride (AMARYL) 4 MG tablet Take 1 tablet (4 mg total) by mouth daily with supper. 90 tablet 0  . levothyroxine (SYNTHROID) 25 MCG tablet Take 1 tablet (25 mcg total) by mouth daily before breakfast. 90 tablet 0  . metFORMIN (GLUCOPHAGE) 1000 MG tablet Take 2 tablets (2,000 mg total) by mouth daily with supper. 180 tablet 0   No current facility-administered medications for this visit.    ROS:   General:  No weight loss, Fever, chills  HEENT: No recent headaches, no nasal bleeding, no visual changes, no sore throat  Neurologic: No dizziness, blackouts, seizures. No recent symptoms of stroke or mini- stroke. No recent episodes of slurred speech, or temporary blindness.  Cardiac: No recent episodes of chest pain/pressure, no shortness of breath at rest.  No shortness of breath with exertion.  Denies history of atrial fibrillation or irregular heartbeat  Vascular: No history of rest pain in feet.  No history of claudication.  No history of non-healing ulcer, No history of DVT   Pulmonary: No home oxygen, no productive cough, no hemoptysis,  No asthma or wheezing  Musculoskeletal:  [ ]  Arthritis, [ ]  Low back pain,  [ ]  Joint pain  Hematologic:No history of hypercoagulable state.  No history of easy bleeding.  No history of anemia  Gastrointestinal: No hematochezia or melena,  No gastroesophageal reflux, no trouble swallowing  Urinary: [ ]  chronic Kidney disease, [ ]  on HD - [ ]  MWF or [ ]  TTHS, [ ]  Burning with urination, [ ]  Frequent urination, [ ]  Difficulty urinating;   Skin: No rashes  Psychological: No history of anxiety,  No history of depression   Physical  Examination  Vitals:   12/19/20 1325  BP: 119/75  Pulse: 63  Resp: 20  Temp: 97.8 F (36.6 C)  SpO2: 98%  Weight: 183 lb 8 oz (83.2 kg)  Height: 5\' 10"  (1.778 m)    Body mass index is 26.33 kg/m.  General:  Alert and oriented, no acute distress HEENT: Normal Neck: No JVD Pulmonary: Clear to auscultation bilaterally Cardiac: Regular Rate and Rhythm Abdomen: Soft, non-tender, non-distended, no mass Skin: No rash, dark discoloration surrounding the right medial foot at the area of previous abscess Charcot changes right medial foot Extremity Pulses:  2+ radial, brachial, femoral, 2+ left 1+ right dorsalis pedis, 2+ posterior tibial pulses bilaterally Musculoskeletal: No deformity or edema  Neurologic: Upper and lower extremity motor 5/5 and symmetric  DATA:  Patient had bilateral ABIs  performed which was greater than 1 triphasic and normal bilaterally.  ASSESSMENT: Patient with Charcot changes right foot.  Recent abscess apparently has healed at this point.  He has arterial circulation was normal by noninvasive vascular exam today.  His right dorsalis pedis pulse was slightly diminished compared to the left but overall fairly normal arterial exam.  Certainly patient is at risk of other infections down the road.  He was given a prescription for compression stockings 20 to 30 mmHg right leg to prevent edema symptoms in the right leg.  PLAN:  Patient will follow-up in our APP clinic in 1 year with repeat ABIs and arterial duplex scan.   Fabienne Bruns, MD [facility] Office: [phone]

## 2020-12-24 NOTE — Progress Notes (Signed)
Subjective: Dean Rasmussen is a 70 y.o. is seen today in office s/p right foot incision and drainage, application of antibiotic beads the return to the operating room for wound closure, graft application which occurred on August 12, 2020 with Dr. Lilian Kapur.  He presents today for routine follow-up.  States he has been doing well.  He has been continuing with daily dressing changes.  Denies any increase swelling or redness or any drainage.  Also presents to get measured for brace. Currently denies any fevers, chills, nausea, vomiting.  No calf pain, chest pain, shortness of breath.   Objective: General: No acute distress, AAOx3 -presents alone wearing regular shoe with knee scooter DP/PT pulses palpable 2/4, CRT < 3 sec to all digits.  RIGHT foot: Ulceration of the dorsal aspect of the right foot measures smaller after debridement measuring 0.5 x 0.3 cm with a depth of 0.1 cm and with a granular wound base.  Overall the wound is much smaller.  There is no surrounding erythema, ascending cellulitis there is no fluctuation, crepitation, malodor.  No signs of infection noted today. He was having some swelling to the leg but no pain with calf compression with calf is supple.  No erythema or warmth.  He notes that he has had this intermittently since the surgery.   No pain with calf compression, swelling, warmth, erythema.   Assessment and Plan: ; Swelling Right foot ulcer-improving  -Treatment options discussed including all alternatives, risks, and complications -Sharply debrided the wound edges utilizing the 312 with scalpel down to healthy, bleeding, viable tissue treatment to remove devitalized nonviable tissue.  The wound was cleaned.  Small mount of Silvadene was applied to the wound.  Nonadherent dressing followed by dressing was applied.  Continue local wound care patient to continue elevation, nonweightbearing for now. -She was measured for a brace today with our pedorthotist, Rick.  -Venous  duplex to rule out DVT although suspicion is low. -Encouraged elevation.  -Monitor for any clinical signs or symptoms of infection and directed to call the office immediately should any occur or go to the ER.  Vivi Barrack DPM

## 2020-12-25 ENCOUNTER — Ambulatory Visit (INDEPENDENT_AMBULATORY_CARE_PROVIDER_SITE_OTHER): Payer: No Typology Code available for payment source

## 2020-12-25 ENCOUNTER — Other Ambulatory Visit: Payer: Self-pay

## 2020-12-25 DIAGNOSIS — R2241 Localized swelling, mass and lump, right lower limb: Secondary | ICD-10-CM | POA: Diagnosis not present

## 2020-12-25 DIAGNOSIS — R609 Edema, unspecified: Secondary | ICD-10-CM

## 2021-01-03 ENCOUNTER — Ambulatory Visit (INDEPENDENT_AMBULATORY_CARE_PROVIDER_SITE_OTHER): Payer: No Typology Code available for payment source | Admitting: Podiatry

## 2021-01-03 ENCOUNTER — Other Ambulatory Visit: Payer: Self-pay

## 2021-01-03 DIAGNOSIS — L97513 Non-pressure chronic ulcer of other part of right foot with necrosis of muscle: Secondary | ICD-10-CM | POA: Diagnosis not present

## 2021-01-03 DIAGNOSIS — E11610 Type 2 diabetes mellitus with diabetic neuropathic arthropathy: Secondary | ICD-10-CM | POA: Diagnosis not present

## 2021-01-07 NOTE — Progress Notes (Signed)
Subjective: Dean Rasmussen is a 70 y.o. is seen today in office s/p right foot incision and drainage, application of antibiotic beads the return to the operating room for wound closure, graft application which occurred on August 12, 2020 with Dr. Lilian Kapur. States he has been doing well. A small callus is starting to come off. Feel that the wound is healed. Denies any drainage or pus. He is awaiting the brace for which he was measured for by our pedorthotist, Rick. Currently denies any fevers, chills, nausea, vomiting.  No calf pain, chest pain, shortness of breath.   Objective: General: No acute distress, AAOx3 -presents alone wearing regular shoe with knee scooter DP/PT pulses palpable 2/4, CRT < 3 sec to all digits.  RIGHT foot: Hyperkeratotic lesion on the previous ulceration. Upon debridement there is the ulceration has healed. There is no drainage or pus. There is minimal chronic edema this been present since the surgery. There is no drainage or pus or any warmth or signs of infection otherwise. Chronic Charcot changes present. No pain with calf compression, swelling, warmth, erythema.   Assessment and Plan: ; Swelling Right foot ulcer-healed; Charcot  -Treatment options discussed including all alternatives, risks, and complications -Debrided the hyperkeratotic lesion without any complications or bleeding. Appears the wound is healed. Recommend moisturizer daily continue offloading. Awaiting the brace to come in. We will see him in the [place] office for this and I will see him at the same time. -Reviewed the venous duplex with him which was negative for DVT. -Monitor for any clinical signs or symptoms of infection and directed to call the office immediately should any occur or go to the ER.  Vivi Barrack DPM

## 2021-01-16 ENCOUNTER — Other Ambulatory Visit: Payer: No Typology Code available for payment source | Admitting: Orthotics

## 2021-01-20 ENCOUNTER — Ambulatory Visit: Payer: No Typology Code available for payment source | Admitting: Podiatry

## 2021-01-20 ENCOUNTER — Other Ambulatory Visit: Payer: No Typology Code available for payment source | Admitting: Orthotics

## 2021-01-21 ENCOUNTER — Ambulatory Visit (INDEPENDENT_AMBULATORY_CARE_PROVIDER_SITE_OTHER): Payer: No Typology Code available for payment source | Admitting: Orthotics

## 2021-01-21 ENCOUNTER — Other Ambulatory Visit: Payer: Self-pay

## 2021-01-21 ENCOUNTER — Ambulatory Visit (INDEPENDENT_AMBULATORY_CARE_PROVIDER_SITE_OTHER): Payer: No Typology Code available for payment source | Admitting: Podiatry

## 2021-01-21 DIAGNOSIS — L97512 Non-pressure chronic ulcer of other part of right foot with fat layer exposed: Secondary | ICD-10-CM

## 2021-01-21 DIAGNOSIS — E119 Type 2 diabetes mellitus without complications: Secondary | ICD-10-CM

## 2021-01-21 DIAGNOSIS — L03119 Cellulitis of unspecified part of limb: Secondary | ICD-10-CM

## 2021-01-21 DIAGNOSIS — L97513 Non-pressure chronic ulcer of other part of right foot with necrosis of muscle: Secondary | ICD-10-CM | POA: Diagnosis not present

## 2021-01-21 DIAGNOSIS — E11610 Type 2 diabetes mellitus with diabetic neuropathic arthropathy: Secondary | ICD-10-CM

## 2021-01-21 DIAGNOSIS — L02619 Cutaneous abscess of unspecified foot: Secondary | ICD-10-CM

## 2021-01-21 MED ORDER — AMOXICILLIN-POT CLAVULANATE 875-125 MG PO TABS: 1.0000 | ORAL_TABLET | Freq: Two times a day (BID) | ORAL | 0 refills | Status: DC

## 2021-01-25 NOTE — Progress Notes (Signed)
Subjective: Dean Rasmussen is a 70 y.o. is seen today in office s/p right foot incision and drainage, application of antibiotic beads the return to the operating room for wound closure, graft application which occurred on August 12, 2020 with Dr. Lilian Kapur.  He states he has been on his feet more and doing more walking as well he has noticed the wound is opened back up some.  Denies any drainage or pus.  Has not chronic swelling.  No pain.  He wears a surgical shoe at home he presents today wearing a regular shoe. Currently denies any fevers, chills, nausea, vomiting.  No calf pain, chest pain, shortness of breath.   Objective: General: No acute distress, AAOx3 -presents alone wearing regular shoe walking DP/PT pulses palpable 2/4, CRT < 3 sec to all digits.  RIGHT foot: Hyperkeratotic lesion on the previous ulceration however the wound is opened up today measuring approximately 1.5 x 0.4 cm with a granular wound base and superficial.  There is no probing, undermining tunneling.  There is mild edema and faint erythema around the wound but there is no ascending cellulitis.  No drainage or pus or any obvious signs of infection noted otherwise. Chronic Charcot changes present. No pain with calf compression, swelling, warmth, erythema.   Assessment and Plan: Ulceration right foot  -Treatment options discussed including all alternatives, risks, and complications -Due to the skin callusing are very dry and consult open.  Recommended continue with antibiotic ointment dressing changes daily.  Prescribed Augmentin. -Offloading -Monitor for any clinical signs or symptoms of infection and directed to call the office immediately should any occur or go to the ER.  Return in about 2 weeks (around 02/04/2021).  Vivi Barrack DPM

## 2021-02-11 ENCOUNTER — Ambulatory Visit (INDEPENDENT_AMBULATORY_CARE_PROVIDER_SITE_OTHER): Payer: No Typology Code available for payment source | Admitting: Podiatry

## 2021-02-11 ENCOUNTER — Other Ambulatory Visit: Payer: Self-pay

## 2021-02-11 DIAGNOSIS — E119 Type 2 diabetes mellitus without complications: Secondary | ICD-10-CM

## 2021-02-11 DIAGNOSIS — E11610 Type 2 diabetes mellitus with diabetic neuropathic arthropathy: Secondary | ICD-10-CM | POA: Diagnosis not present

## 2021-02-11 DIAGNOSIS — L97513 Non-pressure chronic ulcer of other part of right foot with necrosis of muscle: Secondary | ICD-10-CM

## 2021-02-11 DIAGNOSIS — L97512 Non-pressure chronic ulcer of other part of right foot with fat layer exposed: Secondary | ICD-10-CM

## 2021-02-11 NOTE — Progress Notes (Signed)
Patient stated that he saw Dr Ardelle Anton today and that the patient's wife stated that the brace was not any good and that he needed to get some inserts.  Patient presents to be casted for orthotics.   A foam impression was casted for his right and left foot.  Patient is a size 12 wide   Patient will be contacted when the orthotics are ready for pick up.

## 2021-02-17 NOTE — Progress Notes (Unsigned)
Cardiology Office Note:    Date:  02/18/2021   ID:  Dean Rasmussen, DOB 1951/05/30, MRN [id number]  PCP:  Patient, [name]  Cardiologist:  Norman Herrlich, MD    Referring MD: No ref. provider found    ASSESSMENT:    1. Paroxysmal atrial fibrillation (HCC)   2. Chronic anticoagulation   3. Hypertensive heart disease without heart failure    PLAN:    In order of problems listed above:  1. Continues to do well maintaining sinus rhythm with home EKG monitoring I challenged her to continue to do it on a regular basis and bring his device or transmit strips to me if there is a question of atrial fibrillation and I have reviewed strips on him through the MyChart portal.  He will continue anticoagulation moderate stroke risk in his beta-blocker 2. Continue his anticoagulant 3. BP at target on the low surprise is not on ACE or ARB with his diabetes but he is seeing an endocrinologist and I will defer along with the question of statin   Next appointment: 6 months   Medication Adjustments/Labs and Tests Ordered: Current medicines are reviewed at length with the patient today.  Concerns regarding medicines are outlined above.  No orders of the defined types were placed in this encounter.  Meds ordered this encounter  Medications  . rivaroxaban (XARELTO) 20 MG TABS tablet    Sig: Take 1 tablet (20 mg total) by mouth daily with supper.    Dispense:  90 tablet    Refill:  3    Chief Complaint  Patient presents with  . Follow-up  . Atrial Fibrillation    History of Present Illness:    Dean Rasmussen is a 70 y.o. male with a hx of atrial fibrillation type 2 diabetes hypertension hypothyroidism frequent ventricular and supraventricular ectopy hypertensive heart disease with left ventricular hypertrophy. On 10/16/2020 he was cardioverted to sinus rhythm with single application 200 J.  His echocardiogram 08/14/2020 showed an ejection fraction of 55 to 60% with severe concentric LVH and  normal left ventricular volume.  Fortunately both atria were normal in size.  He was last seen 11/12/2020.  Compliance with diet, lifestyle and medications: Yes  His wife is an Charity fundraiser to use the iPhone adapter to screen his heart rhythms on a regular basis and no episodes of atrial fibrillation. Home blood pressures run in the range 130 140/70-80. He is seeing an endocrinologist for diabetic control last A1c 8.6% Recent labs reviewed surprising his LDL cholesterol is ideal without a statin.  He also has not taken a ACE or an ARB He is anticoagulated without bleeding complication Occasional palpitation not severe sustained no edema shortness of breath or chest pain Past Medical History:  Diagnosis Date  . Abscess of right foot   . Atrial fibrillation (HCC) 08/14/2020  . Charcot's arthropathy 08/14/2020  . Diabetes mellitus without complication (HCC)   . Diabetic neuropathy (HCC) 08/14/2020  . Dysrhythmia   . Essential hypertension 08/24/2019  . Hypertension   . Hypertensive heart disease 08/24/2019  . Hypertensive left ventricular hypertrophy, without heart failure 08/24/2019  . Normocytic anemia 08/14/2020  . Open wound of right foot with complication   . Pain in right arm 05/22/2018  . Type 2 diabetes mellitus without complication, without long-term current use of insulin (HCC) 08/24/2019  . Ventricular ectopy 08/24/2019    Past Surgical History:  Procedure Laterality Date  . CARDIOVERSION N/A 10/16/2020   Procedure: CARDIOVERSION;  Surgeon: Lennie Odor  Maisie Fus, MD;  Location: [facility] ENDOSCOPY;  Service: Cardiovascular;  Laterality: N/A;  . FOOT SURGERY Right 07/2020  . I & D EXTREMITY Right 08/09/2020   Procedure: IRRIGATION AND DEBRIDEMENT OF FOOT WITH IMPLANTATION OF ANTIBIOTIC CEMENT BEADS;  Surgeon: Edwin Cap, DPM;  Location: [facility] OR;  Service: Podiatry;  Laterality: Right;  . IRRIGATION AND DEBRIDEMENT FOOT Right 08/09/2020   IRRIGATION AND DEBRIDEMENT OF FOOT WITH IMPLANTATION OF  ANTIBIOTIC CEMENT BEADS (Right Foot)  . IRRIGATION AND DEBRIDEMENT FOOT Right 08/12/2020   Procedure: Secondary wound closure and removal of antibiotic beads;  Surgeon: Edwin Cap, DPM;  Location[facility] OR;  Service: Podiatry;  Laterality: Right;  . ROTATOR CUFF REPAIR      Current Medications: Current Meds  Medication Sig  . acetaminophen (TYLENOL) 500 MG tablet Take 1,000 mg by mouth every 6 (six) hours as needed (pain).  Marland Kitchen glimepiride (AMARYL) 4 MG tablet Take 1 tablet (4 mg total) by mouth daily with supper.  . levothyroxine (SYNTHROID) 25 MCG tablet Take 1 tablet (25 mcg total) by mouth daily before breakfast.  . MAGNESIUM MALATE PO Take 1-2 tablets by mouth See admin instructions. 2 tablets in the evening.  . metFORMIN (GLUCOPHAGE) 1000 MG tablet Take 2 tablets (2,000 mg total) by mouth daily with supper.  . metoprolol succinate (TOPROL-XL) 100 MG 24 hr tablet Take 1 tablet (100 mg total) by mouth daily.  . minocycline (MINOCIN) 100 MG capsule Take by mouth.  Letta Pate VERIO test strip 1 each 2 (two) times daily.  . Probiotic Product (PROBIOTIC PO) Take 1 capsule by mouth in the morning and at bedtime.  . [DISCONTINUED] rivaroxaban (XARELTO) 20 MG TABS tablet Take 1 tablet (20 mg total) by mouth daily with supper.     Allergies:   Patient has no known allergies.   Social History   Socioeconomic History  . Marital status: Married    Spouse name: Not on file  . Number of children: Not on file  . Years of education: Not on file  . Highest education level: Not on file  Occupational History  . Not on file  Tobacco Use  . Smoking status: Never Smoker  . Smokeless tobacco: Never Used  Vaping Use  . Vaping Use: Never used  Substance and Sexual Activity  . Alcohol use: Yes    Comment: occasional  . Drug use: Never  . Sexual activity: Yes  Other Topics Concern  . Not on file  Social History Narrative  . Not on file   Social Determinants of Health   Financial Resource  Strain: Not on file  Food Insecurity: Not on file  Transportation Needs: Not on file  Physical Activity: Not on file  Stress: Not on file  Social Connections: Not on file     Family History: The patient's family history includes Atrial fibrillation in his mother; Bladder Cancer in his father; CAD in his brother; Diabetes in his brother and mother; Heart attack in his brother; Heart disease in his mother; Other in his sister. ROS:   Please see the history of present illness.    All other systems reviewed and are negative.  EKGs/Labs/Other Studies Reviewed:    The following studies were reviewed today:   Recent Labs:  01/23/2021 cholesterol 121 LDL 56 triglycerides 117 HDL 44 GFR 59 cc creatinine 1.24 he has an element of diabetic nephropathy potassium 4.6 08/09/2020: TSH 0.892 08/10/2020: ALT 16 09/20/2020: Magnesium 1.6; Platelets 265 10/16/2020: BUN 23; Creatinine, Ser 1.00;  Hemoglobin 12.9; Potassium 4.1; Sodium 140  Recent Lipid Panel No results found for: CHOL, TRIG, HDL, CHOLHDL, VLDL, LDLCALC, LDLDIRECT  Physical Exam:    VS:  BP (!) 158/80   Pulse 72   Ht 5\' 10"  (1.778 m)   Wt 191 lb (86.6 kg)   SpO2 99%   BMI 27.41 kg/m     Wt Readings from Last 3 Encounters:  02/18/21 191 lb (86.6 kg)  12/19/20 183 lb 8 oz (83.2 kg)  11/12/20 180 lb 1.3 oz (81.7 kg)     GEN: He no longer looks chronically ill or debilitated well nourished, well developed in no acute distress HEENT: Normal NECK: No JVD; No carotid bruits LYMPHATICS: No lymphadenopathy CARDIAC: RRR, no murmurs, rubs, gallops RESPIRATORY:  Clear to auscultation without rales, wheezing or rhonchi  ABDOMEN: Soft, non-tender, non-distended MUSCULOSKELETAL:  No edema; No deformity  SKIN: Warm and dry NEUROLOGIC:  Alert and oriented x 3 PSYCHIATRIC:  Normal affect    Signed, 11/14/20, MD  02/18/2021 8:45 AM    [facility]

## 2021-02-18 ENCOUNTER — Ambulatory Visit (INDEPENDENT_AMBULATORY_CARE_PROVIDER_SITE_OTHER): Payer: No Typology Code available for payment source | Admitting: Cardiology

## 2021-02-18 ENCOUNTER — Encounter: Payer: Self-pay | Admitting: Cardiology

## 2021-02-18 ENCOUNTER — Other Ambulatory Visit: Payer: Self-pay

## 2021-02-18 VITALS — BP 158/80 | HR 72 | Ht 70.0 in | Wt 191.0 lb

## 2021-02-18 DIAGNOSIS — I480 Paroxysmal atrial fibrillation: Secondary | ICD-10-CM | POA: Diagnosis not present

## 2021-02-18 DIAGNOSIS — I119 Hypertensive heart disease without heart failure: Secondary | ICD-10-CM | POA: Diagnosis not present

## 2021-02-18 DIAGNOSIS — Z7901 Long term (current) use of anticoagulants: Secondary | ICD-10-CM

## 2021-02-18 MED ORDER — RIVAROXABAN 20 MG PO TABS: 20.0000 mg | ORAL_TABLET | Freq: Every day | ORAL | 3 refills | Status: DC

## 2021-02-18 NOTE — Patient Instructions (Signed)

## 2021-02-18 NOTE — Progress Notes (Signed)
Subjective: Dean Rasmussen is a 70 y.o. is seen today in office s/p right foot incision and drainage, application of antibiotic beads the return to the operating room for wound closure, graft application which occurred on August 12, 2020 with Dr. Lilian Kapur.  The wound did heal but he was on his foot quite a bit and it started opened back up.  He states that since I last saw him last appointment and started to heal better.  He denies any swelling or redness or any drainage.  We previously had made him a Mezzo brace but did not wear it because his wife stated that it did not have cushion she did not want to wear it. Currently denies any fevers, chills, nausea, vomiting.  No calf pain, chest pain, shortness of breath.   Objective: General: No acute distress, AAOx3 -presents alone wearing regular shoe walking DP/PT pulses palpable 2/4, CRT < 3 sec to all digits.  RIGHT foot: Small superficial skin fissure present on the area the previous wound.  Is granular without any surrounding erythema, ascending cellulitis there is no fluctuation crepitation but there is no malodor.  No obvious signs of infection are noted today. Chronic Charcot changes present. No pain with calf compression, swelling, warmth, erythema.   Assessment and Plan: Ulceration right foot-improved  -Treatment options discussed including all alternatives, risks, and complications -No significant tissue to debride today.  Continue a small amount of antibiotic ointment.  He did not want to try the brace.  I measured for orthotics today to offload.  -Monitor for any clinical signs or symptoms of infection and directed to call the office immediately should any occur or go to the ER.  Return in about 3 weeks (around 03/04/2021).  Vivi Barrack DPM

## 2021-03-07 ENCOUNTER — Other Ambulatory Visit: Payer: Self-pay | Admitting: Cardiology

## 2021-03-07 NOTE — Telephone Encounter (Signed)
Metoprolol Succ ER 100 mg # 90 x 3 refill to pharmacy

## 2021-03-10 ENCOUNTER — Other Ambulatory Visit: Payer: Self-pay

## 2021-03-10 ENCOUNTER — Ambulatory Visit (INDEPENDENT_AMBULATORY_CARE_PROVIDER_SITE_OTHER): Payer: No Typology Code available for payment source | Admitting: Podiatry

## 2021-03-10 DIAGNOSIS — E11610 Type 2 diabetes mellitus with diabetic neuropathic arthropathy: Secondary | ICD-10-CM

## 2021-03-10 DIAGNOSIS — L97512 Non-pressure chronic ulcer of other part of right foot with fat layer exposed: Secondary | ICD-10-CM

## 2021-03-11 ENCOUNTER — Encounter: Payer: Self-pay | Admitting: Podiatry

## 2021-03-11 NOTE — Progress Notes (Signed)
  Subjective:  Patient ID: Dean Rasmussen, male    DOB: [birth year],  MRN: [id number]  Chief Complaint  Patient presents with  . Foot Ulcer      Ulcer check- can see Soni Kegel/puo(orthotics in)    70 y.o. male presents with the above complaint. History confirmed with patient.  He is doing well has not had much drainage at all  Objective:  Physical Exam: warm, good capillary refill, no trophic changes or ulcerative lesions and normal DP and PT pulses.  Loss of protective sensation.  Ulceration is healed completely Assessment:   1. Charcot foot due to diabetes mellitus (HCC)   2. Ulcerated, foot, right, with fat layer exposed (HCC)      Plan:  Patient was evaluated and treated and all questions answered.  Doing well I scraped the overlying scab and the ulcer has completely healed.  His diabetic multidensity accommodative insoles were present today as well.  These were assessed for fit form and function and he seemed to be pleased with him.  We will see how he tolerates these over the next several weeks.  Return in 6 weeks to see Dr. Ardelle Anton for follow-up  Return in about 6 weeks (around 04/21/2021) for with Dr Ardelle Anton .

## 2021-04-03 LAB — HM DIABETES EYE EXAM

## 2021-04-25 ENCOUNTER — Ambulatory Visit: Payer: No Typology Code available for payment source | Admitting: Podiatry

## 2021-05-02 ENCOUNTER — Other Ambulatory Visit: Payer: Self-pay

## 2021-05-02 ENCOUNTER — Ambulatory Visit (INDEPENDENT_AMBULATORY_CARE_PROVIDER_SITE_OTHER): Payer: No Typology Code available for payment source | Admitting: Podiatry

## 2021-05-02 ENCOUNTER — Encounter: Payer: Self-pay | Admitting: Podiatry

## 2021-05-02 DIAGNOSIS — L84 Corns and callosities: Secondary | ICD-10-CM

## 2021-05-02 DIAGNOSIS — M79675 Pain in left toe(s): Secondary | ICD-10-CM

## 2021-05-02 DIAGNOSIS — E11610 Type 2 diabetes mellitus with diabetic neuropathic arthropathy: Secondary | ICD-10-CM

## 2021-05-02 DIAGNOSIS — B351 Tinea unguium: Secondary | ICD-10-CM

## 2021-05-02 DIAGNOSIS — M79674 Pain in right toe(s): Secondary | ICD-10-CM | POA: Diagnosis not present

## 2021-05-02 DIAGNOSIS — E1149 Type 2 diabetes mellitus with other diabetic neurological complication: Secondary | ICD-10-CM | POA: Diagnosis not present

## 2021-05-07 NOTE — Progress Notes (Signed)
Subjective: 70 year old male presents the office for evaluation of a wound to his right foot.  He states that the area has dried up and has a callus but denies any open lesions or any swelling or redness or any drainage.  He is wearing regular shoes and inserts which do help take some of the pressure off the area.  He did not want to use the brace that we had made previously.  Also asking for the nails to be trimmed as his wife went out on him driving himself.  They do become uncomfortable inside shoes. Denies any systemic complaints such as fevers, chills, nausea, vomiting. No acute changes since last appointment, and no other complaints at this time.   Objective: AAO x3, NAD DP/PT pulses palpable bilaterally, CRT less than 3 seconds Minimal callus on the previous wound on the right foot replacement more dry skin.  No ulceration identified.  No edema, erythema or signs of infection. Nails are hypertrophic, dystrophic, brittle, discolored, elongated 10. No surrounding redness or drainage. Tenderness nails 1-5 bilaterally. No open lesions or pre-ulcerative lesions are identified today. No pain with calf compression, swelling, warmth, erythema  Assessment: Symptomatic onychomycosis, preulcerative callus right foot  Plan: -All treatment options discussed with the patient including all alternatives, risks, complications.  -Sharply debrided the nails x10 without any complications or bleeding-Continue moisturizer, offloading daily.  Continue inserts.  Discussed daily foot inspection and continue glucose control.  His A1c has increased he reports as he has been doing more snacking.  He can work on his diet as well. -Patient encouraged to call the office with any questions, concerns, change in symptoms.   Vivi Barrack DPM

## 2021-08-08 ENCOUNTER — Other Ambulatory Visit: Payer: Self-pay

## 2021-08-08 ENCOUNTER — Encounter: Payer: Self-pay | Admitting: Podiatry

## 2021-08-08 ENCOUNTER — Ambulatory Visit (INDEPENDENT_AMBULATORY_CARE_PROVIDER_SITE_OTHER): Payer: No Typology Code available for payment source | Admitting: Podiatry

## 2021-08-08 DIAGNOSIS — L84 Corns and callosities: Secondary | ICD-10-CM

## 2021-08-08 DIAGNOSIS — E1149 Type 2 diabetes mellitus with other diabetic neurological complication: Secondary | ICD-10-CM

## 2021-08-08 DIAGNOSIS — E11610 Type 2 diabetes mellitus with diabetic neuropathic arthropathy: Secondary | ICD-10-CM | POA: Diagnosis not present

## 2021-08-08 NOTE — Progress Notes (Signed)
Subjective: 70 year old male presents the office for diabetic foot evaluation.  He states he has been doing well.  He dystrophic and was tried on a callus top medial aspect of foot where he had a prior surgery.  He states that he does not use a moisturizer on his heel cracks open some.  He has not seen any swelling or redness or drainage.  No new concerns.  No fevers or chills.  No other concerns.  He is trying to watch his diet and his sugars were in the mid 100s. Last A1c was 9.4 on Apr 23, 2019  Objective: AAO x3, NAD DP/PT pulses palpable bilaterally, CRT less than 3 seconds Minimal callus on the previous wound on the right foot replacement more dry skin.  Just proximal to the area the callus there is once very superficial area of skin breakdown.  There is no edema, erythema, drainage or pus or any signs of infection at this area.  And generalized dry skin present on the right foot without any other areas of skin breakdown. No pain with calf compression, swelling, warmth, erythema  Assessment: Preulcerative callus right foot, dry skin  Plan: -All treatment options discussed with the patient including all alternatives, risks, complications.  -I debrided the hyperkeratotic lesion to the any complications or bleeding.  There was 1 very small superficial area skin breakdown along the dorsal aspect of the callus area.  Recommend a small amount of antibiotic ointment.  Otherwise keep moisturizer on the callused as well as moisturizer generally to his feet.  Discussed daily foot inspection, glucose control.  Continue orthotics.  Vivi Barrack DPM

## 2021-08-08 NOTE — Patient Instructions (Signed)

## 2021-08-26 ENCOUNTER — Other Ambulatory Visit: Payer: Self-pay

## 2021-08-26 ENCOUNTER — Ambulatory Visit (INDEPENDENT_AMBULATORY_CARE_PROVIDER_SITE_OTHER): Payer: No Typology Code available for payment source | Admitting: Cardiology

## 2021-08-26 ENCOUNTER — Encounter: Payer: Self-pay | Admitting: Cardiology

## 2021-08-26 VITALS — BP 134/80 | HR 70 | Ht 70.0 in | Wt 190.0 lb

## 2021-08-26 DIAGNOSIS — I480 Paroxysmal atrial fibrillation: Secondary | ICD-10-CM

## 2021-08-26 DIAGNOSIS — I493 Ventricular premature depolarization: Secondary | ICD-10-CM

## 2021-08-26 DIAGNOSIS — Z7901 Long term (current) use of anticoagulants: Secondary | ICD-10-CM

## 2021-08-26 DIAGNOSIS — I119 Hypertensive heart disease without heart failure: Secondary | ICD-10-CM | POA: Diagnosis not present

## 2021-08-26 NOTE — Patient Instructions (Signed)
Medication Instructions:  Your physician recommends that you continue on your current medications as directed. Please refer to the Current Medication list given to you today.  *If you need a refill on your cardiac medications before your next appointment, please call your pharmacy*   Lab Work: Your physician recommends that you return for lab work in: TODAY CBC, CMP, Lipids If you have labs (blood work) drawn today and your tests are completely normal, you will receive your results only by: MyChart Message (if you have MyChart) OR A paper copy in the mail If you have any lab test that is abnormal or we need to change your treatment, we will call you to review the results.   Testing/Procedures: None   Follow-Up: At [facility], you and your health needs are our priority.  As part of our continuing mission to provide you with exceptional heart care, we have created designated Provider Care Teams.  These Care Teams include your primary Cardiologist (physician) and Advanced Practice Providers (APPs -  Physician Assistants and Nurse Practitioners) who all work together to provide you with the care you need, when you need it.  We recommend signing up for the patient portal called "MyChart".  Sign up information is provided on this After Visit Summary.  MyChart is used to connect with patients for Virtual Visits (Telemedicine).  Patients are able to view lab/test results, encounter notes, upcoming appointments, etc.  Non-urgent messages can be sent to your provider as well.   To learn more about what you can do with MyChart, go to [email or web address].    Your next appointment:   9 month(s)  The format for your next appointment:   In Person  Provider:   Brian Munley, MD   Other Instructions   

## 2021-08-26 NOTE — Progress Notes (Signed)
Cardiology Office Note:    Date:  08/26/2021   ID:  Dean Rasmussen, DOB 1951-10-19, MRN [id number]  PCP:  Patient, [name] (Inactive)  Cardiologist:  Norman Herrlich, MD    Referring MD: No ref. provider found    ASSESSMENT:    1. Paroxysmal atrial fibrillation (HCC)   2. Chronic anticoagulation   3. Hypertensive heart disease without heart failure   4. Ventricular ectopy    PLAN:    In order of problems listed above:  Dean Rasmussen continues to do well no recurrence of atrial fibrillation he will continue his current beta-blocker and anticoagulant. Stable no bleeding complications continue Xarelto check renal function for dosing Stable repeat blood pressure at target continue current treatment beta-blocker Stable asymptomatic continue beta-blocker  Next appointment: 9 months, he is establishing PCP care   Medication Adjustments/Labs and Tests Ordered: Current medicines are reviewed at length with the patient today.  Concerns regarding medicines are outlined above.  Orders Placed This Encounter  Procedures   CBC   Comprehensive metabolic panel   Lipid panel   No orders of the defined types were placed in this encounter.   Chief Complaint  Patient presents with   Follow-up   Atrial Fibrillation    History of Present Illness:    Dean Rasmussen is a 70 y.o. male with a hx of paroxysmal atrial fibrillation maintaining sinus rhythm and chronic anticoagulation hypertensive heart disease without heart failure type 2 diabetes frequent ventricular and supraventricular ectopy and hypothyroidism last seen 02/18/2021.On 10/16/2020 he was cardioverted to sinus rhythm with single application 200 J.  His echocardiogram 08/14/2020 showed an ejection fraction of 55 to 60% with severe concentric LVH and normal left ventricular volume.  Fortunately both atria were normal in size   Compliance with diet, lifestyle and medications: Yes  Recent labs 04/22/2021 [facility] hemoglobin A1c  9.4%  His wife is not present today.  She is an Char[employer] fundraiser.  His home blood pressures been running in the range of 110-120/80 and the use a wireless receiver mobile cardia and has had no episodes of atrial fibrillation. No edema chest pain shortness of breath palpitation or syncope and no bleeding from his anticoagulant. He has an upcoming visit with his new PCP. Blood sugar is variable running from as low as 103 to a high of 220. Past Medical History:  Diagnosis Date   Abscess of right foot    Atrial fibrillation (HCC) 08/14/2020   Charcot's arthropathy 08/14/2020   Diabetes mellitus without complication (HCC)    Diabetic neuropathy (HCC) 08/14/2020   Dysrhythmia    Essential hypertension 08/24/2019   Hypertension    Hypertensive heart disease 08/24/2019   Hypertensive left ventricular hypertrophy, without heart failure 08/24/2019   Normocytic anemia 08/14/2020   Open wound of right foot with complication    Pain in right arm 05/22/2018   Type 2 diabetes mellitus without complication, without long-term current use of insulin (HCC) 08/24/2019   Ventricular ectopy 08/24/2019    Past Surgical History:  Procedure Laterality Date   CARDIOVERSION N/A 10/16/2020   Procedure: CARDIOVERSION;  Surgeon: Sande Rives, MD;  Location: [facility] ENDOSCOPY;  Service: Cardiovascular;  Laterality: N/A;   FOOT SURGERY Right 07/2020   I & D EXTREMITY Right 08/09/2020   Procedure: IRRIGATION AND DEBRIDEMENT OF FOOT WITH IMPLANTATION OF ANTIBIOTIC CEMENT BEADS;  Surgeon: Edwin Cap, DPM;  Location: [facility] OR;  Service: Podiatry;  Laterality: Right;   IRRIGATION AND DEBRIDEMENT FOOT Right 08/09/2020   IRRIGATION  AND DEBRIDEMENT OF FOOT WITH IMPLANTATION OF ANTIBIOTIC CEMENT BEADS (Right Foot)   IRRIGATION AND DEBRIDEMENT FOOT Right 08/12/2020   Procedure: Secondary wound closure and removal of antibiotic beads;  Surgeon: Edwin Cap, DPM;  Location: [facility] OR;  Service: Podiatry;  Laterality: Right;   ROTATOR CUFF  REPAIR      Current Medications: Current Meds  Medication Sig   acetaminophen (TYLENOL) 500 MG tablet Take 1,000 mg by mouth every 6 (six) hours as needed (pain).   glimepiride (AMARYL) 4 MG tablet Take 1 tablet (4 mg total) by mouth daily with supper.   levothyroxine (SYNTHROID) 25 MCG tablet Take 1 tablet (25 mcg total) by mouth daily before breakfast.   MAGNESIUM MALATE PO Take 1-2 tablets by mouth See admin instructions. 2 tablets in the evening.   metFORMIN (GLUCOPHAGE) 1000 MG tablet Take 2 tablets (2,000 mg total) by mouth daily with supper.   metoprolol succinate (TOPROL-XL) 100 MG 24 hr tablet TAKE 1 TABLET BY MOUTH EVERY DAY   minocycline (MINOCIN) 100 MG capsule Take by mouth.   ONETOUCH VERIO test strip 1 each 2 (two) times daily.   Probiotic Product (PROBIOTIC PO) Take 1 capsule by mouth in the morning and at bedtime.   rivaroxaban (XARELTO) 20 MG TABS tablet Take 1 tablet (20 mg total) by mouth daily with supper.     Allergies:   Patient has no known allergies.   Social History   Socioeconomic History   Marital status: Married    Spouse name: Not on file   Number of children: Not on file   Years of education: Not on file   Highest education level: Not on file  Occupational History   Not on file  Tobacco Use   Smoking status: Never   Smokeless tobacco: Never  Vaping Use   Vaping Use: Never used  Substance and Sexual Activity   Alcohol use: Yes    Comment: occasional   Drug use: Never   Sexual activity: Yes  Other Topics Concern   Not on file  Social History Narrative   Not on file   Social Determinants of Health   Financial Resource Strain: Not on file  Food Insecurity: Not on file  Transportation Needs: Not on file  Physical Activity: Not on file  Stress: Not on file  Social Connections: Not on file     Family History: The patient's family history includes Atrial fibrillation in his mother; Bladder Cancer in his father; CAD in his brother;  Diabetes in his brother and mother; Heart attack in his brother; Heart disease in his mother; Other in his sister. ROS:   Please see the history of present illness.    All other systems reviewed and are negative.  EKGs/Labs/Other Studies Reviewed:    The following studies were reviewed today:  EKG:  EKG ordered today and personally reviewed.  The ekg ordered today demonstrates sinus rhythm first-degree AV block early repolarization pattern.  Recent Labs: 09/20/2020: Magnesium 1.6; Platelets 265 10/16/2020: BUN 23; Creatinine, Ser 1.00; Hemoglobin 12.9; Potassium 4.1; Sodium 140  Recent Lipid Panel No results found for: CHOL, TRIG, HDL, CHOLHDL, VLDL, LDLCALC, LDLDIRECT  Physical Exam:    VS:  BP (!) 158/84 (BP Location: Left Arm, Patient Position: Sitting, Cuff Size: Normal)   Pulse 70   Ht 5\' 10"  (1.778 m)   Wt 190 lb (86.2 kg)   SpO2 99%   BMI 27.26 kg/m     Wt Readings from Last 3 Encounters:  08/26/21 190 lb (86.2 kg)  02/18/21 191 lb (86.6 kg)  12/19/20 183 lb 8 oz (83.2 kg)     GEN:  Well nourished, well developed in no acute distress HEENT: Normal NECK: No JVD; No carotid bruits LYMPHATICS: No lymphadenopathy CARDIAC: RRR, no murmurs, rubs, gallops RESPIRATORY:  Clear to auscultation without rales, wheezing or rhonchi  ABDOMEN: Soft, non-tender, non-distended MUSCULOSKELETAL:  No edema; No deformity  SKIN: Warm and dry NEUROLOGIC:  Alert and oriented x 3 PSYCHIATRIC:  Normal affect    Signed, Norman Herrlich, MD  08/26/2021 9:21 AM    [facility]

## 2021-08-27 LAB — COMPREHENSIVE METABOLIC PANEL
ALT: 28 IU/L (ref 0–44)
AST: 26 IU/L (ref 0–40)
Albumin/Globulin Ratio: 2 (ref 1.2–2.2)
Albumin: 4.7 g/dL (ref 3.8–4.8)
Alkaline Phosphatase: 84 IU/L (ref 44–121)
BUN/Creatinine Ratio: 17 (ref 10–24)
BUN: 21 mg/dL (ref 8–27)
Bilirubin Total: 0.5 mg/dL (ref 0.0–1.2)
CO2: 26 mmol/L (ref 20–29)
Calcium: 9.9 mg/dL (ref 8.6–10.2)
Chloride: 100 mmol/L (ref 96–106)
Creatinine, Ser: 1.23 mg/dL (ref 0.76–1.27)
Globulin, Total: 2.3 g/dL (ref 1.5–4.5)
Glucose: 269 mg/dL — ABNORMAL HIGH (ref 70–99)
Potassium: 4.9 mmol/L (ref 3.5–5.2)
Sodium: 140 mmol/L (ref 134–144)
Total Protein: 7 g/dL (ref 6.0–8.5)
eGFR: 63 mL/min/{1.73_m2} (ref >59)

## 2021-08-27 LAB — LIPID PANEL
Chol/HDL Ratio: 2.8 ratio (ref 0.0–5.0)
Cholesterol, Total: 108 mg/dL (ref 100–199)
HDL: 38 mg/dL — ABNORMAL LOW (ref >39)
LDL Chol Calc (NIH): 45 mg/dL (ref 0–99)
Triglycerides: 147 mg/dL (ref 0–149)
VLDL Cholesterol Cal: 25 mg/dL (ref 5–40)

## 2021-08-27 LAB — CBC
Hematocrit: 37.8 % (ref 37.5–51.0)
Hemoglobin: 12.6 g/dL — ABNORMAL LOW (ref 13.0–17.7)
MCH: 31 pg (ref 26.6–33.0)
MCHC: 33.3 g/dL (ref 31.5–35.7)
MCV: 93 fL (ref 79–97)
Platelets: 199 10*3/uL (ref 150–450)
RBC: 4.06 x10E6/uL — ABNORMAL LOW (ref 4.14–5.80)
RDW: 12.5 % (ref 11.6–15.4)
WBC: 5.3 10*3/uL (ref 3.4–10.8)

## 2021-09-09 ENCOUNTER — Ambulatory Visit (INDEPENDENT_AMBULATORY_CARE_PROVIDER_SITE_OTHER): Payer: No Typology Code available for payment source | Admitting: Family Medicine

## 2021-09-09 ENCOUNTER — Other Ambulatory Visit: Payer: Self-pay

## 2021-09-09 ENCOUNTER — Encounter: Payer: Self-pay | Admitting: Family Medicine

## 2021-09-09 VITALS — BP 129/78 | HR 83 | Temp 97.8°F | Ht 70.0 in | Wt 193.0 lb

## 2021-09-09 DIAGNOSIS — I10 Essential (primary) hypertension: Secondary | ICD-10-CM

## 2021-09-09 DIAGNOSIS — I480 Paroxysmal atrial fibrillation: Secondary | ICD-10-CM | POA: Diagnosis not present

## 2021-09-09 DIAGNOSIS — M1460 Charcot's joint, unspecified site: Secondary | ICD-10-CM | POA: Diagnosis not present

## 2021-09-09 DIAGNOSIS — E119 Type 2 diabetes mellitus without complications: Secondary | ICD-10-CM | POA: Diagnosis not present

## 2021-09-09 DIAGNOSIS — E039 Hypothyroidism, unspecified: Secondary | ICD-10-CM

## 2021-09-09 NOTE — Assessment & Plan Note (Signed)
He will continue management per endocrinology at this time.

## 2021-09-09 NOTE — Assessment & Plan Note (Signed)
He will continue to see podiatry for management.

## 2021-09-09 NOTE — Assessment & Plan Note (Signed)
Blood pressure is well controlled at this time.  He will continue current medications for management of hypertension.  Low-sodium diet encouraged.

## 2021-09-09 NOTE — Assessment & Plan Note (Addendum)
History of atrial fibrillation.  Regular rate and rhythm on exam today.  Continue metoprolol for rate control and Xarelto for anticoagulation.  Referral placed to cardiology here in [place] due to closer proximity to his home.

## 2021-09-09 NOTE — Assessment & Plan Note (Signed)
Poorly controlled type 2 diabetes.  He will continue to follow with endocrinology for management of this.  Encouraged to work on dietary change for better glucose control.

## 2021-09-09 NOTE — Progress Notes (Signed)
Dean Rasmussen - 70 y.o. male MRN [id number]  Date of birth: 18-Apr-1951  Subjective Chief Complaint  Patient presents with   Establish Care    HPI Dean Rasmussen is a 71 year old male here today for initial visit to establish care.  He has a history of type 2 diabetes, hypothyroidism, hypertension and atrial fibrillation.  He does see endocrinology for management of his diabetes as well as his hypothyroidism.  He reports these are stable at this time.  Per review of records available through care everywhere his most recent A1c in May was 9.4%.  He has been hesitant to add on insulin.  He does admit that diet could be greatly improved.  His diabetes has been complicated by neuropathy with history of diabetic ulcers and Charcot foot.  This is managed by podiatry.  He does see cardiology for management of chronic cardiac conditions.  His blood pressure and heart rate have been controlled with metoprolol.  He is anticoagulated with Xarelto.  Denies bleeding.  He would like referral to cardiology here in [place] as this is closer to his home.  ROS:  A comprehensive ROS was completed and negative except as noted per HPI  No Known Allergies  Past Medical History:  Diagnosis Date   Abscess of right foot    Atrial fibrillation (HCC) 08/14/2020   Charcot's arthropathy 08/14/2020   Diabetes mellitus without complication (HCC)    Diabetic neuropathy (HCC) 08/14/2020   Dysrhythmia    Essential hypertension 08/24/2019   Hypertension    Hypertensive heart disease 08/24/2019   Hypertensive left ventricular hypertrophy, without heart failure 08/24/2019   Normocytic anemia 08/14/2020   Open wound of right foot with complication    Pain in right arm 05/22/2018   Type 2 diabetes mellitus without complication, without long-term current use of insulin (HCC) 08/24/2019   Ventricular ectopy 08/24/2019    Past Surgical History:  Procedure Laterality Date   CARDIOVERSION N/A 10/16/2020   Procedure: CARDIOVERSION;   Surgeon: Sande Rives, MD;  Location: [facility] ENDOSCOPY;  Service: Cardiovascular;  Laterality: N/A;   FOOT SURGERY Right 07/2020   I & D EXTREMITY Right 08/09/2020   Procedure: IRRIGATION AND DEBRIDEMENT OF FOOT WITH IMPLANTATION OF ANTIBIOTIC CEMENT BEADS;  Surgeon: Edwin Cap, DPM;  Location: [facility] OR;  Service: Podiatry;  Laterality: Right;   IRRIGATION AND DEBRIDEMENT FOOT Right 08/09/2020   IRRIGATION AND DEBRIDEMENT OF FOOT WITH IMPLANTATION OF ANTIBIOTIC CEMENT BEADS (Right Foot)   IRRIGATION AND DEBRIDEMENT FOOT Right 08/12/2020   Procedure: Secondary wound closure and removal of antibiotic beads;  Surgeon: Edwin Cap, DPM;  Location: [facility] OR;  Service: Podiatry;  Laterality: Right;   ROTATOR CUFF REPAIR      Social History   Socioeconomic History   Marital status: Married    Spouse name: Not on file   Number of children: Not on file   Years of education: Not on file   Highest education level: Not on file  Occupational History   Not on file  Tobacco Use   Smoking status: Never   Smokeless tobacco: Never  Vaping Use   Vaping Use: Never used  Substance and Sexual Activity   Alcohol use: Yes    Comment: occasional   Drug use: Never   Sexual activity: Yes  Other Topics Concern   Not on file  Social History Narrative   Not on file   Social Determinants of Health   Financial Resource Strain: Not on file  Food Insecurity: Not on  file  Transportation Needs: Not on file  Physical Activity: Not on file  Stress: Not on file  Social Connections: Not on file    Family History  Problem Relation Age of Onset   Heart disease Mother    Atrial fibrillation Mother    Diabetes Mother    Bladder Cancer Father    Other Sister        No known health problems   Heart attack Brother    CAD Brother    Diabetes Brother     Health Maintenance  Topic Date Due   COVID-19 Vaccine (1) Never done   URINE MICROALBUMIN  Never done   Hepatitis C Screening  Never done    TETANUS/TDAP  Never done   COLONOSCOPY (Pts 45-25yrs Insurance coverage will need to be confirmed)  Never done   HEMOGLOBIN A1C  02/06/2021   OPHTHALMOLOGY EXAM  04/03/2022   FOOT EXAM  05/02/2022   HPV VACCINES  Aged Out   INFLUENZA VACCINE  Discontinued   Zoster Vaccines- Shingrix  Discontinued     ----------------------------------------------------------------------------------------------------------------------------------------------------------------------------------------------------------------- Physical Exam BP 129/78 (BP Location: Right Arm, Patient Position: Sitting, Cuff Size: Normal)   Pulse 83   Temp 97.8 F (36.6 C) (Oral)   Ht 5\' 10"  (1.778 m)   Wt 193 lb (87.5 kg)   SpO2 100%   BMI 27.69 kg/m   Physical Exam Constitutional:      Appearance: Normal appearance.  Eyes:     General: No scleral icterus. Cardiovascular:     Rate and Rhythm: Normal rate and regular rhythm.  Pulmonary:     Effort: Pulmonary effort is normal.     Breath sounds: Normal breath sounds.  Musculoskeletal:     Cervical back: Neck supple.  Neurological:     Mental Status: He is alert.  Psychiatric:        Mood and Affect: Mood normal.        Behavior: Behavior normal.    ------------------------------------------------------------------------------------------------------------------------------------------------------------------------------------------------------------------- Assessment and Plan  No problem-specific Assessment & Plan notes found for this encounter.   No orders of the defined types were placed in this encounter.   Return in about 6 months (around 03/10/2022) for HTN.    This visit occurred during the SARS-CoV-2 public health emergency.  Safety protocols were in place, including screening questions prior to the visit, additional usage of staff PPE, and extensive cleaning of exam room while observing appropriate contact time as indicated for disinfecting  solutions.

## 2021-09-10 ENCOUNTER — Encounter: Payer: Self-pay | Admitting: Family Medicine

## 2021-09-16 ENCOUNTER — Telehealth: Payer: Self-pay

## 2021-09-16 NOTE — Telephone Encounter (Signed)
Pharmacy notified Losartan was d/c on 08/26/21. Refill denied.

## 2021-10-09 ENCOUNTER — Telehealth: Payer: Self-pay | Admitting: Cardiology

## 2021-10-09 NOTE — Telephone Encounter (Signed)
Patient would like to switch to Dr Jens Som in [place].  Please advise

## 2021-10-10 ENCOUNTER — Other Ambulatory Visit: Payer: Self-pay

## 2021-10-10 ENCOUNTER — Ambulatory Visit (INDEPENDENT_AMBULATORY_CARE_PROVIDER_SITE_OTHER): Payer: No Typology Code available for payment source | Admitting: Podiatry

## 2021-10-10 ENCOUNTER — Encounter: Payer: Self-pay | Admitting: Podiatry

## 2021-10-10 DIAGNOSIS — L84 Corns and callosities: Secondary | ICD-10-CM | POA: Diagnosis not present

## 2021-10-10 DIAGNOSIS — E1149 Type 2 diabetes mellitus with other diabetic neurological complication: Secondary | ICD-10-CM | POA: Diagnosis not present

## 2021-10-10 DIAGNOSIS — E11610 Type 2 diabetes mellitus with diabetic neuropathic arthropathy: Secondary | ICD-10-CM

## 2021-10-10 NOTE — Progress Notes (Signed)
  Subjective:  Patient ID: Dean Rasmussen, male    DOB: [birth year],   MRN: [id number]  Chief Complaint  Patient presents with   Callouses    I have a spot on the instep of the right foot and I have been using some ointment on it and I am hoping it is on the way to being healed     70 y.o. male presents for diabetic foot check. Relates a callus on the right foot that has been present since a prior surgery. Has beend closely followed by Dr. Ardelle Anton. Last A1c was  9.4 on 04/22/21. Denies any other pedal complaints. Denies n/v/f/c.   PCP: Everrett Coombe DO   Past Medical History:  Diagnosis Date   Abscess of right foot    Atrial fibrillation (HCC) 08/14/2020   Charcot's arthropathy 08/14/2020   Diabetes mellitus without complication (HCC)    Diabetic neuropathy (HCC) 08/14/2020   Dysrhythmia    Essential hypertension 08/24/2019   Hypertension    Hypertensive heart disease 08/24/2019   Hypertensive left ventricular hypertrophy, without heart failure 08/24/2019   Normocytic anemia 08/14/2020   Open wound of right foot with complication    Pain in right arm 05/22/2018   Type 2 diabetes mellitus without complication, without long-term current use of insulin (HCC) 08/24/2019   Ventricular ectopy 08/24/2019    Objective:  Physical Exam: Vascular: DP/PT pulses 2/4 bilateral. CFT <3 seconds. Normal hair growth on digits. No edema.  Skin. No lacerations or abrasions bilateral feet. Hyperkeratotic tissue noted to righ foot insteps, pre-ulcerative. Musculoskeletal: MMT 5/5 bilateral lower extremities in DF, PF, Inversion and Eversion. Deceased ROM in DF of ankle joint.  Neurological: Sensation intact to light touch.   Assessment:   1. Pre-ulcerative calluses   2. Type II diabetes mellitus with neurological manifestations (HCC)   3. Charcot foot due to diabetes mellitus (HCC)      Plan:  Patient was evaluated and treated and all questions answered. -Discussed pre-ulcerative calluses -Miminal  hyperkeratotic tissue was debrided with chisel without incident. .  -Encouraged daily moisturizing -Advised to keep bandaid or padding in the area.  -Advised good supportive shoes and inserts -Patient to return to office in 2-3 months for routine foot care.    Louann Sjogren, DPM

## 2021-11-18 ENCOUNTER — Other Ambulatory Visit: Payer: Self-pay | Admitting: Cardiology

## 2021-11-25 ENCOUNTER — Encounter: Payer: Self-pay | Admitting: Cardiology

## 2021-11-25 NOTE — Progress Notes (Signed)
HPI: Follow-up atrial fibrillation.  Previously followed by Dr. Dulce Sellar.  Echocardiogram September 2021 showed ejection fraction 55 to 60%, severe left ventricular hypertrophy.  He underwent cardioversion of atrial fibrillation in November 2021.  ABIs January 2022 normal.  Patient preferred to be followed in [place].  Patient denies dyspnea on exertion, orthopnea, PND, pedal edema, chest pain or syncope.  Current Outpatient Medications  Medication Sig Dispense Refill   acetaminophen (TYLENOL) 500 MG tablet Take 1,000 mg by mouth every 6 (six) hours as needed (pain).     glimepiride (AMARYL) 4 MG tablet Take 1 tablet (4 mg total) by mouth daily with supper. 90 tablet 0   levothyroxine (SYNTHROID) 25 MCG tablet Take 1 tablet (25 mcg total) by mouth daily before breakfast. 90 tablet 0   MAGNESIUM MALATE PO Take 1-2 tablets by mouth See admin instructions. 2 tablets in the evening.     metFORMIN (GLUCOPHAGE) 1000 MG tablet Take 2 tablets (2,000 mg total) by mouth daily with supper. 180 tablet 0   metoprolol succinate (TOPROL-XL) 100 MG 24 hr tablet TAKE 1 TABLET BY MOUTH EVERY DAY 90 tablet 3   metroNIDAZOLE (METROCREAM) 0.75 % cream Apply 1 application topically 2 (two) times daily.     minocycline (MINOCIN) 100 MG capsule Take 100 mg by mouth as directed. For Rosacea     ONETOUCH VERIO test strip 1 each 2 (two) times daily.     rivaroxaban (XARELTO) 20 MG TABS tablet Take 1 tablet (20 mg total) by mouth daily with supper. 90 tablet 3   metFORMIN (GLUCOPHAGE) 1000 MG tablet Take by mouth. (Patient not taking: Reported on 12/08/2021)     Probiotic Product (PROBIOTIC PO) Take 1 capsule by mouth in the morning and at bedtime. (Patient not taking: Reported on 12/08/2021)     No current facility-administered medications for this visit.     Past Medical History:  Diagnosis Date   Abscess of right foot    Atrial fibrillation (HCC) 08/14/2020   Charcot's arthropathy 08/14/2020   Diabetic  neuropathy (HCC) 08/14/2020   Essential hypertension 08/24/2019   Hypertensive left ventricular hypertrophy, without heart failure 08/24/2019   Normocytic anemia 08/14/2020   Open wound of right foot with complication    Pain in right arm 05/22/2018   Type 2 diabetes mellitus without complication, without long-term current use of insulin (HCC) 08/24/2019   Ventricular ectopy 08/24/2019    Past Surgical History:  Procedure Laterality Date   CARDIOVERSION N/A 10/16/2020   Procedure: CARDIOVERSION;  Surgeon: Sande Rives, MD;  Location: [facility] ENDOSCOPY;  Service: Cardiovascular;  Laterality: N/A;   FOOT SURGERY Right 07/2020   I & D EXTREMITY Right 08/09/2020   Procedure: IRRIGATION AND DEBRIDEMENT OF FOOT WITH IMPLANTATION OF ANTIBIOTIC CEMENT BEADS;  Surgeon: Edwin Cap, DPM;  Location: [facility] OR;  Service: Podiatry;  Laterality: Right;   IRRIGATION AND DEBRIDEMENT FOOT Right 08/09/2020   IRRIGATION AND DEBRIDEMENT OF FOOT WITH IMPLANTATION OF ANTIBIOTIC CEMENT BEADS (Right Foot)   IRRIGATION AND DEBRIDEMENT FOOT Right 08/12/2020   Procedure: Secondary wound closure and removal of antibiotic beads;  Surgeon: Edwin Cap, DPM;  Location[facility] OR;  Service: Podiatry;  Laterality: Right;   ROTATOR CUFF REPAIR      Social History   Socioeconomic History   Marital status: Married    Spouse name: Not on file   Number of children: Not on file   Years of education: Not on file   Highest education  level: Not on file  Occupational History   Not on file  Tobacco Use   Smoking status: Never   Smokeless tobacco: Never  Vaping Use   Vaping Use: Never used  Substance and Sexual Activity   Alcohol use: Yes    Comment: occasional   Drug use: Never   Sexual activity: Yes  Other Topics Concern   Not on file  Social History Narrative   Not on file   Social Determinants of Health   Financial Resource Strain: Not on file  Food Insecurity: Not on file  Transportation Needs: Not  on file  Physical Activity: Not on file  Stress: Not on file  Social Connections: Not on file  Intimate Partner Violence: Not on file    Family History  Problem Relation Age of Onset   Heart disease Mother    Atrial fibrillation Mother    Diabetes Mother    Bladder Cancer Father    Other Sister        No known health problems   Heart attack Brother    CAD Brother    Diabetes Brother     ROS: no fevers or chills, productive cough, hemoptysis, dysphasia, odynophagia, melena, hematochezia, dysuria, hematuria, rash, seizure activity, orthopnea, PND, pedal edema, claudication. Remaining systems are negative.  Physical Exam: Well-developed well-nourished in no acute distress.  Skin is warm and dry.  HEENT is normal.  Neck is supple.  Chest is clear to auscultation with normal expansion.  Cardiovascular exam is regular rate and rhythm.  Abdominal exam nontender or distended. No masses palpated. Extremities show no edema. neuro grossly intact  A/P  1 paroxysmal atrial fibrillation-patient is in sinus rhythm on examination.  Continue Toprol for rate control if atrial fibrillation recurs.  Continue Xarelto.  2 hypertension-blood pressure controlled.  Continue present medications and follow.  3 diabetes mellitus-managed by primary care.  We will arrange calcium score for risk stratification.  He will likely need a statin given diabetes mellitus is a coronary artery disease equivalent but will await final calcium score.  Kirk Ruths, MD

## 2021-12-08 ENCOUNTER — Other Ambulatory Visit: Payer: Self-pay

## 2021-12-08 ENCOUNTER — Ambulatory Visit (INDEPENDENT_AMBULATORY_CARE_PROVIDER_SITE_OTHER): Payer: No Typology Code available for payment source | Admitting: Cardiology

## 2021-12-08 ENCOUNTER — Encounter: Payer: Self-pay | Admitting: Cardiology

## 2021-12-08 VITALS — BP 107/64 | HR 70 | Ht 70.0 in | Wt 194.4 lb

## 2021-12-08 DIAGNOSIS — Z136 Encounter for screening for cardiovascular disorders: Secondary | ICD-10-CM

## 2021-12-08 DIAGNOSIS — I10 Essential (primary) hypertension: Secondary | ICD-10-CM

## 2021-12-08 DIAGNOSIS — I480 Paroxysmal atrial fibrillation: Secondary | ICD-10-CM

## 2021-12-08 NOTE — Patient Instructions (Signed)
°  Testing/Procedures:  CORONARY CALCIUM CT SCORING AT THE [place] OFFICE   Follow-Up: At [facility], you and your health needs are our priority.  As part of our continuing mission to provide you with exceptional heart care, we have created designated Provider Care Teams.  These Care Teams include your primary Cardiologist (physician) and Advanced Practice Providers (APPs -  Physician Assistants and Nurse Practitioners) who all work together to provide you with the care you need, when you need it.  We recommend signing up for the patient portal called "MyChart".  Sign up information is provided on this After Visit Summary.  MyChart is used to connect with patients for Virtual Visits (Telemedicine).  Patients are able to view lab/test results, encounter notes, upcoming appointments, etc.  Non-urgent messages can be sent to your provider as well.   To learn more about what you can do with MyChart, go to [email or web address].    Your next appointment:   6 month(s)  The format for your next appointment:   In Person  Provider:   Olga Millers MD

## 2021-12-12 ENCOUNTER — Ambulatory Visit (INDEPENDENT_AMBULATORY_CARE_PROVIDER_SITE_OTHER): Payer: No Typology Code available for payment source | Admitting: Podiatry

## 2021-12-12 ENCOUNTER — Encounter: Payer: Self-pay | Admitting: Podiatry

## 2021-12-12 ENCOUNTER — Other Ambulatory Visit: Payer: Self-pay

## 2021-12-12 DIAGNOSIS — M79674 Pain in right toe(s): Secondary | ICD-10-CM

## 2021-12-12 DIAGNOSIS — B351 Tinea unguium: Secondary | ICD-10-CM

## 2021-12-12 DIAGNOSIS — E11610 Type 2 diabetes mellitus with diabetic neuropathic arthropathy: Secondary | ICD-10-CM

## 2021-12-12 DIAGNOSIS — M79675 Pain in left toe(s): Secondary | ICD-10-CM | POA: Diagnosis not present

## 2021-12-12 DIAGNOSIS — E1149 Type 2 diabetes mellitus with other diabetic neurological complication: Secondary | ICD-10-CM | POA: Diagnosis not present

## 2021-12-12 DIAGNOSIS — L84 Corns and callosities: Secondary | ICD-10-CM

## 2021-12-12 NOTE — Progress Notes (Signed)
°  Subjective:  Patient ID: Dean Rasmussen, male    DOB: 09-Aug-1951,   MRN: [id number]  Chief Complaint  Patient presents with   Follow-up    Right foot surgery site , patient is concern about the healing and dryness of the surgery site    71 y.o. male presents for diabetic foot check. Relates a callus on the right foot that has been present since a prior surgery and is pre-ulcerative. Also relates thickened elongated and painful toenails that he would like trimmed. Relates occasional burning and tingling in his feet.  Has beend closely followed by Dr. Ardelle Anton. Last A1c was  9.4 on 04/22/21. Denies any other pedal complaints. Denies n/v/f/c.   PCP: Everrett Coombe DO   Past Medical History:  Diagnosis Date   Abscess of right foot    Atrial fibrillation (HCC) 08/14/2020   Charcot's arthropathy 08/14/2020   Diabetic neuropathy (HCC) 08/14/2020   Essential hypertension 08/24/2019   Hypertensive left ventricular hypertrophy, without heart failure 08/24/2019   Normocytic anemia 08/14/2020   Open wound of right foot with complication    Pain in right arm 05/22/2018   Type 2 diabetes mellitus without complication, without long-term current use of insulin (HCC) 08/24/2019   Ventricular ectopy 08/24/2019    Objective:  Physical Exam: Vascular: DP/PT pulses 2/4 bilateral. CFT <3 seconds. Normal hair growth on digits. No edema.  Skin. No lacerations or abrasions bilateral feet. Hyperkeratotic tissue noted to righ foot insteps, pre-ulcerative. Nails 1-5 are thickened discolored and elongated with subungual debris.  Musculoskeletal: MMT 5/5 bilateral lower extremities in DF, PF, Inversion and Eversion. Deceased ROM in DF of ankle joint. Charcot deformity of right foot. Medial prominence noted to midfoot.  Neurological: Sensation intact to light touch. Decreased protective sensation.   Assessment:   1. Type II diabetes mellitus with neurological manifestations (HCC)   2. Dermatophytosis of nail    3. Pain in toes of both feet   4. Charcot foot due to diabetes mellitus (HCC)   5. Pre-ulcerative calluses       Plan:  Patient was evaluated and treated and all questions answered. -Discussed pre-ulcerative calluses -Miminal hyperkeratotic tissue was debrided with chisel without incident. Proximal part of incision had small pinpoint area he had picked at earlier. Covered with neosporin and bandaid. -Discussed and educated patient on diabetic foot care, especially with  regards to the vascular, neurological and musculoskeletal systems.  -Stressed the importance of good glycemic control and the detriment of not  controlling glucose levels in relation to the foot. -Discussed supportive shoes at all times and checking feet regularly.  -Mechanically debrided all nails 1-5 bilateral using sterile nail nipper and filed with dremel without incident  -Answered all patient questions -Patient to return  in 3 months for at risk foot care -Patient advised to call the office if any problems or questions arise in the meantime. Louann Sjogren, DPM

## 2021-12-23 ENCOUNTER — Ambulatory Visit (INDEPENDENT_AMBULATORY_CARE_PROVIDER_SITE_OTHER): Payer: Self-pay

## 2021-12-23 ENCOUNTER — Other Ambulatory Visit: Payer: Self-pay

## 2021-12-23 DIAGNOSIS — Z136 Encounter for screening for cardiovascular disorders: Secondary | ICD-10-CM

## 2021-12-26 ENCOUNTER — Telehealth: Payer: Self-pay | Admitting: *Deleted

## 2021-12-26 ENCOUNTER — Encounter: Payer: Self-pay | Admitting: Cardiology

## 2021-12-26 DIAGNOSIS — I2510 Atherosclerotic heart disease of native coronary artery without angina pectoris: Secondary | ICD-10-CM

## 2021-12-26 NOTE — Telephone Encounter (Signed)
-----   Message from Lelon Perla, MD sent at 12/23/2021 12:48 PM EST ----- Schedule stress nuclear study; ca score significantly elevated; add crestor 40 mg daily; lipids and liver 8 weeks; CTA of thoracic aorta one year for mildly dilated aortic root. Kirk Ruths

## 2021-12-26 NOTE — Telephone Encounter (Signed)
Spoke with pt, Aware of dr Jacalyn Lefevre recommendations. The patient does not want a statin at this time. Order placed for stress test.

## 2021-12-26 NOTE — Telephone Encounter (Signed)
Follow  Up: ? ? ? ?Patient is returning a call, concerning his CT results ?

## 2021-12-30 NOTE — Telephone Encounter (Signed)
This encounter was created in error - please disregard.

## 2022-01-14 ENCOUNTER — Other Ambulatory Visit: Payer: Self-pay | Admitting: *Deleted

## 2022-01-14 DIAGNOSIS — Z136 Encounter for screening for cardiovascular disorders: Secondary | ICD-10-CM

## 2022-03-11 ENCOUNTER — Other Ambulatory Visit: Payer: Self-pay | Admitting: Cardiology

## 2022-03-12 ENCOUNTER — Other Ambulatory Visit: Payer: Self-pay

## 2022-03-12 ENCOUNTER — Ambulatory Visit: Payer: No Typology Code available for payment source | Admitting: Podiatry

## 2022-03-12 MED ORDER — RIVAROXABAN 20 MG PO TABS: 20.0000 mg | ORAL_TABLET | Freq: Every day | ORAL | 0 refills | Status: DC

## 2022-03-12 NOTE — Telephone Encounter (Signed)
Prescription refill request for Xarelto received.  ?Indication:Afib ?Last office visit:1/23 ?Weight:88.2 kg ?Age:71 ?Scr:1.2 ?CrCl:70.44 ml/min ? ?Prescription refilled ? ?

## 2022-04-03 ENCOUNTER — Ambulatory Visit (INDEPENDENT_AMBULATORY_CARE_PROVIDER_SITE_OTHER): Payer: No Typology Code available for payment source | Admitting: Podiatry

## 2022-04-03 DIAGNOSIS — E119 Type 2 diabetes mellitus without complications: Secondary | ICD-10-CM

## 2022-04-03 DIAGNOSIS — E1149 Type 2 diabetes mellitus with other diabetic neurological complication: Secondary | ICD-10-CM

## 2022-04-03 DIAGNOSIS — L84 Corns and callosities: Secondary | ICD-10-CM

## 2022-04-06 NOTE — Progress Notes (Signed)
Subjective: ?71 year old male presents the office for diabetic foot evaluation and for recurrent callus on his right foot.  Some some tenderness along the medial aspect foot where he had surgery previously, Charcot.  Denies any skin breakdown or ulcerations.  He states he does not need the nails trimmed.  No present swelling or redness or any warmth to his foot.  No fevers or chills and reports.  No new concerns otherwise. ? ?Last A1c was 9.4 on Apr 22, 2021 ? ?Objective: ?AAO x3, NAD ?DP/PT pulses palpable bilaterally, CRT less than 3 seconds ?Sensation decreased with Semmes Weinstein monofilament ?Minimal callus on the previous wound on the right foot replacement more dry skin.  No skin breakdown noted today.  He has occasional tenderness on the callus as well as with point prominence but no specific area pinpoint tenderness today. ?No pain with calf compression, swelling, warmth, erythema ? ?Assessment: ?Preulcerative callus right foot, uncontrolled diabetes with neuropathy ? ?Plan: ?-All treatment options discussed with the patient including all alternatives, risks, complications.  ?-I sharply debrided the hyperkeratotic lesion to the any complications or bleeding.  I did recommend moisturizer and offloading daily.  ?-Continue supportive shoe gear.  ?-Daily foot inspection, glucose control ? ?Return in about 3 months (around 07/04/2022). ? ?Vivi Barrack DPM ?

## 2022-04-09 ENCOUNTER — Ambulatory Visit (INDEPENDENT_AMBULATORY_CARE_PROVIDER_SITE_OTHER): Payer: No Typology Code available for payment source

## 2022-04-09 ENCOUNTER — Ambulatory Visit (INDEPENDENT_AMBULATORY_CARE_PROVIDER_SITE_OTHER): Payer: No Typology Code available for payment source | Admitting: Sports Medicine

## 2022-04-09 DIAGNOSIS — M47812 Spondylosis without myelopathy or radiculopathy, cervical region: Secondary | ICD-10-CM | POA: Diagnosis not present

## 2022-04-09 MED ORDER — ACETAMINOPHEN ER 650 MG PO TBCR: 650.0000 mg | EXTENDED_RELEASE_TABLET | Freq: Three times a day (TID) | ORAL | 3 refills | Status: AC | PRN

## 2022-04-09 MED ORDER — TRAMADOL HCL 50 MG PO TABS: 50.0000 mg | ORAL_TABLET | Freq: Three times a day (TID) | ORAL | 0 refills | Status: DC | PRN

## 2022-04-09 NOTE — Assessment & Plan Note (Signed)
[place] 71 year old male, long history of axial neck pain, right-sided radiating over the trapezius, nothing radicular, no trauma, no red flag symptoms. ?Very limited rotation and extension on exam today. ?He is on Xarelto so we have to avoid NSAIDs, adding arthritis strength Tylenol with tramadol for breakthrough pain, x-rays, formal physical therapy, return to see me in 6 weeks. ?

## 2022-04-09 NOTE — Progress Notes (Signed)
? ? ?  Procedures performed today:   ? ?None. ? ?Independent interpretation of notes and tests performed by another provider:  ? ?None. ? ?Brief History, Exam, Impression, and Recommendations:   ? ?Cervical spondylosis ?[place] 71 year old male, long history of axial neck pain, right-sided radiating over the trapezius, nothing radicular, no trauma, no red flag symptoms. ?Very limited rotation and extension on exam today. ?He is on Xarelto so we have to avoid NSAIDs, adding arthritis strength Tylenol with tramadol for breakthrough pain, x-rays, formal physical therapy, return to see me in 6 weeks. ? ?Chronic process with exacerbation and pharmacologic intervention ? ?___________________________________________ ?Gwen Her. Dianah Field, M.D., ABFM., CAQSM. ?Primary Care and Sports Medicine ?[place] ? ?Adjunct Instructor of Family Medicine  ?[employer] ?

## 2022-04-14 ENCOUNTER — Other Ambulatory Visit: Payer: Self-pay | Admitting: Cardiology

## 2022-04-15 MED ORDER — RIVAROXABAN 20 MG PO TABS
20.0000 mg | ORAL_TABLET | Freq: Every day | ORAL | 0 refills | Status: DC
Start: 2022-04-15 — End: 2022-05-15

## 2022-04-15 NOTE — Telephone Encounter (Signed)
Prescription refill request for Xarelto received.  ?Indication:Afib ?Last office visit:1/23 ?Weight:88.2 kg ?Age:71 ?Scr:1.2 ?CrCl:70.44 ml/min ? ?Prescription refilled ? ?

## 2022-05-05 ENCOUNTER — Encounter: Payer: Self-pay | Admitting: Physical Therapy

## 2022-05-05 ENCOUNTER — Other Ambulatory Visit: Payer: Self-pay

## 2022-05-05 ENCOUNTER — Ambulatory Visit: Payer: No Typology Code available for payment source | Attending: Sports Medicine | Admitting: Physical Therapy

## 2022-05-05 DIAGNOSIS — R2689 Other abnormalities of gait and mobility: Secondary | ICD-10-CM | POA: Insufficient documentation

## 2022-05-05 DIAGNOSIS — R252 Cramp and spasm: Secondary | ICD-10-CM

## 2022-05-05 DIAGNOSIS — M47812 Spondylosis without myelopathy or radiculopathy, cervical region: Secondary | ICD-10-CM | POA: Insufficient documentation

## 2022-05-05 DIAGNOSIS — M542 Cervicalgia: Secondary | ICD-10-CM

## 2022-05-05 DIAGNOSIS — R29898 Other symptoms and signs involving the musculoskeletal system: Secondary | ICD-10-CM | POA: Insufficient documentation

## 2022-05-05 DIAGNOSIS — M6281 Muscle weakness (generalized): Secondary | ICD-10-CM | POA: Diagnosis present

## 2022-05-05 NOTE — Therapy (Addendum)
[facility] [place] [place], [place], [address] Phone: [phone]   Fax:  [phone]  Physical Therapy Evaluation  Patient Details  Name: Dean Rasmussen MRN: [id number] Date of Birth: 01-05-1951 Referring Provider (PT): Silverio Decamp, MD   Encounter Date: 05/05/2022  Rationale for Evaluation and Treatment Rehabilitation   PT End of Session - 05/05/22 1019     Visit Number 1    Date for PT Re-Evaluation 06/16/22    Authorization Type [employer]    PT Start Time 1019    PT Stop Time 1058    PT Time Calculation (min) 39 min    Activity Tolerance Patient tolerated treatment well    Behavior During Therapy [facility] for tasks assessed/performed             Past Medical History:  Diagnosis Date   Abscess of right foot    Atrial fibrillation ([place]) 08/14/2020   Charcot's arthropathy 08/14/2020   Diabetic neuropathy ([place]) 08/14/2020   Essential hypertension 08/24/2019   Hypertensive left ventricular hypertrophy, without heart failure 08/24/2019   Normocytic anemia 08/14/2020   Open wound of right foot with complication    Pain in right arm 05/22/2018   Type 2 diabetes mellitus without complication, without long-term current use of insulin ([place]) 08/24/2019   Ventricular ectopy 08/24/2019    Past Surgical History:  Procedure Laterality Date   CARDIOVERSION N/A 10/16/2020   Procedure: CARDIOVERSION;  Surgeon: Geralynn Rile, MD;  Location: [facility];  Service: Cardiovascular;  Laterality: N/A;   FOOT SURGERY Right 07/2020   I & D EXTREMITY Right 08/09/2020   Procedure: IRRIGATION AND DEBRIDEMENT OF FOOT WITH IMPLANTATION OF ANTIBIOTIC CEMENT BEADS;  Surgeon: Criselda Peaches, DPM;  Location: [facility];  Service: Podiatry;  Laterality: Right;   IRRIGATION AND DEBRIDEMENT FOOT Right 08/09/2020   IRRIGATION AND DEBRIDEMENT OF FOOT WITH IMPLANTATION OF ANTIBIOTIC CEMENT BEADS (Right Foot)   IRRIGATION AND  DEBRIDEMENT FOOT Right 08/12/2020   Procedure: Secondary wound closure and removal of antibiotic beads;  Surgeon: Criselda Peaches, DPM;  Location: [facility];  Service: Podiatry;  Laterality: Right;   ROTATOR CUFF REPAIR      There were no vitals filed for this visit.    Subjective Assessment - 05/05/22 1021     Subjective Hurt his back 3 years ago. It was the worst pain I've ever had. A couple years ago he would get a bump on scapula but it would go away. About a month ago the pain returned with a vengeance. Started taking Tylenol arthritis and this has really helped. I can feel it but it's not painful.    Pertinent History DDD lumbar, OA, DM    Patient Stated Goals relieve the pain, get back some of his ROM    Currently in Pain? Yes    Pain Score 10-Worst pain ever    Pain Location Scapula    Pain Orientation Right    Pain Descriptors / Indicators Aching;Sharp    Pain Type Chronic pain    Pain Onset More than a month ago    Pain Frequency Intermittent    Aggravating Factors  no    Pain Relieving Factors tylenol arthritis                [facility] PT Assessment - 05/05/22 0001       Assessment   Medical Diagnosis M47.812 (ICD-10-CM) - Cervical spondylosis    Referring Provider (PT) Silverio Decamp, MD  Onset Date/Surgical Date 03/30/22    Hand Dominance Right    Next MD Visit 05/21/22      Precautions   Precautions None      Restrictions   Weight Bearing Restrictions No      Balance Screen   Has the patient fallen in the past 6 months Yes    How many times? 1   fell out of bed in the middle of the night on right side   Has the patient had a decrease in activity level because of a fear of falling?  No    Is the patient reluctant to leave their home because of a fear of falling?  No      Home Environment   Living Environment Private residence    Living Arrangements Spouse/significant other    Type of Home Apartment    Additional Comments no stairs      Prior  Function   Level of Independence Independent      Observation/Other Assessments   Focus on Therapeutic Outcomes (FOTO)  64 (68 predicted)      Posture/Postural Control   Posture/Postural Control Postural limitations    Postural Limitations Rounded Shoulders;Forward head;Decreased thoracic kyphosis   tight pectorals     ROM / Strength   AROM / PROM / Strength AROM;Strength      AROM   AROM Assessment Site Cervical    Cervical Flexion full    Cervical Extension 11    Cervical - Right Side Bend 13    Cervical - Left Side Bend 5    Cervical - Right Rotation 55    Cervical - Left Rotation 29      Strength   Overall Strength Comments Rt SB 5/5, else 4/5    Strength Assessment Site Shoulder    Right/Left Shoulder Right;Left    Right Shoulder Flexion 5/5    Right Shoulder Extension 4+/5    Right Shoulder ABduction 5/5    Right Shoulder Internal Rotation 5/5    Right Shoulder External Rotation 4/5    Left Shoulder Flexion 3+/5    Left Shoulder Extension 4+/5    Left Shoulder ABduction 5/5    Left Shoulder Internal Rotation 4+/5    Left Shoulder External Rotation 5/5      Flexibility   Soft Tissue Assessment /Muscle Length yes   pectoralis     Palpation   Spinal mobility marked CPA and UPA deficits in cervical and thoracic spines    Palpation comment Rt levator insertion at scapula                        Objective measurements completed on examination: See above findings.                PT Education - 05/05/22 1055     Education Details HEP, POC    Person(s) Educated Patient    Methods Explanation;Demonstration    Comprehension Verbalized understanding              PT Short Term Goals - 05/05/22 1515       PT SHORT TERM GOAL #1   Title Ind with initial HEP    Time 2    Period Weeks    Status New    Target Date 05/19/22      PT SHORT TERM GOAL #2   Title Pt to demonstrate improved postural awareness to help improve functional  outcomes.    Time 4  Period Weeks    Status New               PT Long Term Goals - 05/05/22 1515       PT LONG TERM GOAL #1   Title The patient will be indep with HEP.    Time 6    Period Weeks    Status New    Target Date 06/16/22      PT LONG TERM GOAL #2   Title Improved FOTO to 68 showing functional improvement in the neck    Time 6    Period Weeks    Status New    Target Date 06/16/22      PT LONG TERM GOAL #3   Title Improved cervical strength to 5/5 to help stabilize his neck.    Time 6    Period Weeks    Status New      PT LONG TERM GOAL #4   Title Improved cervical ROM by 10 deg in all planes except R rotation to improve ADL function.    Time 6    Period Weeks    Status New      PT LONG TERM GOAL #5   Title Decreased pain in his neck/scapula by > 80% with ADLs.    Time 6    Period Weeks    Status New                    Plan - 05/05/22 1058     Clinical Impression Statement Patient presents with c/o or right neck/scapular pain that started a few years ago but has worsened in the past month. He started taking Tylenol Arthritis which has helped considerably but he still feels it. He also has marked limitations in cervical ROM affecting ADLs and deficits in both cervical and shoulder strength. Dean Rasmussen has significant postural deficits likely affecting his neck pain, and has limited shoulder flexion due previous shoulder surgeries and a current left RC tear. He has significant deficits in spinal mobility in both the cervical and thoracic spines. He will benefit from skilled PT to address these deficits.    Personal Factors and Comorbidities Behavior Pattern;Comorbidity 3+    Comorbidities DM (unregulated), HTN, bil RCR and biceps tears, left RC tear    Stability/Clinical Decision Making Stable/Uncomplicated    Clinical Decision Making Low    Rehab Potential Good    PT Frequency 2x / week    PT Duration 6 weeks    PT Treatment/Interventions  ADLs/Self Care Home Management;Cryotherapy;Electrical Stimulation;Moist Heat;Iontophoresis 4mg /ml Dexamethasone;Neuromuscular re-education;Therapeutic exercise;Therapeutic activities;Patient/family education;Manual techniques;Dry needling;Taping;Traction    PT Next Visit Plan Get neck moving, spinal mobs, postural and neck strengthening    PT Home Exercise Plan [email or web address]    Consulted and Agree with Plan of Care Patient             Patient will benefit from skilled therapeutic intervention in order to improve the following deficits and impairments:  Decreased range of motion, Pain, Increased muscle spasms, Impaired UE functional use, Hypomobility, Impaired flexibility, Postural dysfunction, Decreased strength  Visit Diagnosis: Cervicalgia  Muscle weakness (generalized)  Cramp and spasm     Problem List Patient Active Problem List   Diagnosis Date Noted   Cervical spondylosis 04/09/2022   Hypothyroidism 09/09/2021   Dysrhythmia    Hypertension    Diabetes mellitus without complication ([place])    Diabetic neuropathy ([place]) 08/14/2020   Charcot's arthropathy 08/14/2020   Normocytic anemia 08/14/2020   Atrial  fibrillation ([place]) 08/14/2020   Open wound of right foot with complication    Abscess of right foot    Hypertensive heart disease 08/24/2019   Hypertensive left ventricular hypertrophy, without heart failure 08/24/2019   Type 2 diabetes mellitus without complication, without long-term current use of insulin ([place]) 08/24/2019   Ventricular ectopy 08/24/2019   Essential hypertension 08/24/2019    Madelyn Flavors, PT 05/05/2022, 3:20 PM  [facility] [place], [place], [address] Phone: [phone]   Fax:  [phone]  Name: Dean Rasmussen MRN: [id number] Date of Birth: 1951-10-14

## 2022-05-05 NOTE — Patient Instructions (Signed)
Access Code: [email or web address] URL: [email or web address] Date: 05/05/2022 Prepared by: Raynelle Fanning  Exercises - Seated Scapular Retraction  - 1 x daily - 7 x weekly - 1-3 sets - 10 reps - 2-3 sec hold - Seated Passive Cervical Retraction  - 2 x daily - 7 x weekly - 1 sets - 10 reps - 5 sec hold - Seated Cervical Rotation AROM  - 2 x daily - 7 x weekly - 1 sets - 10 reps - 5 sec hold - Standing Cervical Retraction with Sidebending  - 2 x daily - 7 x weekly - 1 sets - 10 reps - 5 sec hold - Seated Cervical Extension AROM  - 2 x daily - 7 x weekly - 1 sets - 10 reps - 5 sec hold - Seated Thoracic Lumbar Extension with Pectoralis Stretch  - 2 x daily - 7 x weekly - 1 sets - 10 reps - 5 hold

## 2022-05-13 ENCOUNTER — Ambulatory Visit: Payer: No Typology Code available for payment source | Admitting: Physical Therapy

## 2022-05-13 DIAGNOSIS — M542 Cervicalgia: Secondary | ICD-10-CM

## 2022-05-13 DIAGNOSIS — M6281 Muscle weakness (generalized): Secondary | ICD-10-CM

## 2022-05-13 NOTE — Therapy (Signed)
[facility] [place] [address] [place] [address] [place], [place], [address] Phone: [phone]   Fax:  [phone]  Physical Therapy Treatment  Patient Details  Name: Dean Rasmussen MRN: [id number] Date of Birth: [birth year] Referring Provider (PT): Monica Becton, MD   Encounter Date: 05/13/2022 Rationale for Evaluation and Treatment Rehabilitation   PT End of Session - 05/13/22 1015     Visit Number 2    Date for PT Re-Evaluation 06/16/22    Authorization Type Aet[employer]    PT Start Time 0930    PT Stop Time 1015    PT Time Calculation (min) 45 min    Activity Tolerance Patient tolerated treatment well    Behavior During Therapy [facility] for tasks assessed/performed             Past Medical History:  Diagnosis Date   Abscess of right foot    Atrial fibrillation (HCC) 08/14/2020   Charcot's arthropathy 08/14/2020   Diabetic neuropathy (HCC) 08/14/2020   Essential hypertension 08/24/2019   Hypertensive left ventricular hypertrophy, without heart failure 08/24/2019   Normocytic anemia 08/14/2020   Open wound of right foot with complication    Pain in right arm 05/22/2018   Type 2 diabetes mellitus without complication, without long-term current use of insulin (HCC) 08/24/2019   Ventricular ectopy 08/24/2019    Past Surgical History:  Procedure Laterality Date   CARDIOVERSION N/A 10/16/2020   Procedure: CARDIOVERSION;  Surgeon: Sande Rives, MD;  Location: [facility] ENDOSCOPY;  Service: Cardiovascular;  Laterality: N/A;   FOOT SURGERY Right 07/2020   I & D EXTREMITY Right 08/09/2020   Procedure: IRRIGATION AND DEBRIDEMENT OF FOOT WITH IMPLANTATION OF ANTIBIOTIC CEMENT BEADS;  Surgeon: Edwin Cap, DPM;  Location: [facility] OR;  Service: Podiatry;  Laterality: Right;   IRRIGATION AND DEBRIDEMENT FOOT Right 08/09/2020   IRRIGATION AND DEBRIDEMENT OF FOOT WITH IMPLANTATION OF ANTIBIOTIC CEMENT BEADS (Right Foot)   IRRIGATION AND  DEBRIDEMENT FOOT Right 08/12/2020   Procedure: Secondary wound closure and removal of antibiotic beads;  Surgeon: Edwin Cap, DPM;  Location: [facility] OR;  Service: Podiatry;  Laterality: Right;   ROTATOR CUFF REPAIR      There were no vitals filed for this visit.   Subjective Assessment - 05/13/22 0934     Subjective Pt states he has been able to wean off of his daily aspirin and is not having pain. He had no trouble with HEP    Patient Stated Goals relieve the pain, get back some of his ROM    Currently in Pain? No/denies                               [facility] Adult PT Treatment/Exercise - 05/13/22 0001       Exercises   Exercises Neck      Neck Exercises: Machines for Strengthening   UBE (Upper Arm Bike) L2 x 4 min alt fwd/bkwd      Neck Exercises: Standing   Upper Extremity D2 Flexion;10 reps    UE D2 Limitations bilat - no resistance    Other Standing Exercises standing against noodle on wall: bear hug x 10, alt shld flex, bilat ER      Neck Exercises: Seated   Neck Retraction 10 reps      Neck Exercises: Stretches   Other Neck Stretches cervical extension with towel 10sec x 10, snags 10 sec hold x 10  Other Neck Stretches doorway stretch 2 x 30 sec                       PT Short Term Goals - 05/05/22 1515       PT SHORT TERM GOAL #1   Title Ind with initial HEP    Time 2    Period Weeks    Status New    Target Date 05/19/22      PT SHORT TERM GOAL #2   Title Pt to demonstrate improved postural awareness to help improve functional outcomes.    Time 4    Period Weeks    Status New               PT Long Term Goals - 05/05/22 1515       PT LONG TERM GOAL #1   Title The patient will be indep with HEP.    Time 6    Period Weeks    Status New    Target Date 06/16/22      PT LONG TERM GOAL #2   Title Improved FOTO to 68 showing functional improvement in the neck    Time 6    Period Weeks    Status New    Target  Date 06/16/22      PT LONG TERM GOAL #3   Title Improved cervical strength to 5/5 to help stabilize his neck.    Time 6    Period Weeks    Status New      PT LONG TERM GOAL #4   Title Improved cervical ROM by 10 deg in all planes except R rotation to improve ADL function.    Time 6    Period Weeks    Status New      PT LONG TERM GOAL #5   Title Decreased pain in his neck/scapula by > 80% with ADLs.    Time 6    Period Weeks    Status New                   Plan - 05/13/22 1015     Clinical Impression Statement Pt with good tolerance to addition of snags and thoracic mobility with pool noodle. Good demo of HEP. He is limited for some exercises by Lt rotator cuff tear but does not have pain    PT Next Visit Plan cervical and thoracic mobility, postural strength    PT Home Exercise Plan [id number]    Consulted and Agree with Plan of Care Patient             Patient will benefit from skilled therapeutic intervention in order to improve the following deficits and impairments:     Visit Diagnosis: Cervicalgia  Muscle weakness (generalized)     Problem List Patient Active Problem List   Diagnosis Date Noted   Cervical spondylosis 04/09/2022   Hypothyroidism 09/09/2021   Dysrhythmia    Hypertension    Diabetes mellitus without complication (HCC)    Diabetic neuropathy (HCC) 08/14/2020   Charcot's arthropathy 08/14/2020   Normocytic anemia 08/14/2020   Atrial fibrillation (HCC) 08/14/2020   Open wound of right foot with complication    Abscess of right foot    Hypertensive heart disease 08/24/2019   Hypertensive left ventricular hypertrophy, without heart failure 08/24/2019   Type 2 diabetes mellitus without complication, without long-term current use of insulin (HCC) 08/24/2019   Ventricular ectopy 08/24/2019   Essential hypertension 08/24/2019  Nicolemarie Wooley, PT 05/13/2022, 10:17 AM  [facility] [address] [place] [address] [place], [place], [address] Phone: [phone]   Fax:  [phone]  Name: Shirlean MylarRoger Glanzer MRN: [id number] Date of Birth: November 19, 1951

## 2022-05-15 ENCOUNTER — Other Ambulatory Visit: Payer: Self-pay | Admitting: Cardiology

## 2022-05-15 ENCOUNTER — Telehealth: Payer: Self-pay | Admitting: Cardiology

## 2022-05-15 ENCOUNTER — Ambulatory Visit: Payer: No Typology Code available for payment source | Admitting: Physical Therapy

## 2022-05-15 DIAGNOSIS — R29898 Other symptoms and signs involving the musculoskeletal system: Secondary | ICD-10-CM

## 2022-05-15 DIAGNOSIS — M542 Cervicalgia: Secondary | ICD-10-CM

## 2022-05-15 DIAGNOSIS — R252 Cramp and spasm: Secondary | ICD-10-CM

## 2022-05-15 DIAGNOSIS — R2689 Other abnormalities of gait and mobility: Secondary | ICD-10-CM

## 2022-05-15 DIAGNOSIS — M6281 Muscle weakness (generalized): Secondary | ICD-10-CM

## 2022-05-15 MED ORDER — RIVAROXABAN 20 MG PO TABS
20.0000 mg | ORAL_TABLET | Freq: Every day | ORAL | 5 refills | Status: DC
Start: 2022-05-15 — End: 2022-11-09

## 2022-05-15 NOTE — Telephone Encounter (Addendum)
Refill sent this morning. Please seen refill encounter from today for further documentation.

## 2022-05-15 NOTE — Therapy (Signed)
[facility] [place] [address] [place] [address] [place], [place], [address] Phone: [phone]   Fax:  [phone]  Physical Therapy Treatment  Patient Details  Name: Dean Rasmussen MRN: [id number] Date of Birth: 1951-07-10 Referring Provider (PT): Monica Becton, MD   Encounter Date: 05/15/2022   PT End of Session - 05/15/22 1019     Visit Number 3    Date for PT Re-Evaluation 06/16/22    Authorization Type Aetna    PT Start Time 1017    PT Stop Time 1100    PT Time Calculation (min) 43 min    Activity Tolerance Patient tolerated treatment well    Behavior During Therapy [facility] for tasks assessed/performed             Past Medical History:  Diagnosis Date   Abscess of right foot    Atrial fibrillation (HCC) 08/14/2020   Charcot's arthropathy 08/14/2020   Diabetic neuropathy (HCC) 08/14/2020   Essential hypertension 08/24/2019   Hypertensive left ventricular hypertrophy, without heart failure 08/24/2019   Normocytic anemia 08/14/2020   Open wound of right foot with complication    Pain in right arm 05/22/2018   Type 2 diabetes mellitus without complication, without long-term current use of insulin (HCC) 08/24/2019   Ventricular ectopy 08/24/2019    Past Surgical History:  Procedure Laterality Date   CARDIOVERSION N/A 10/16/2020   Procedure: CARDIOVERSION;  Surgeon: Sande Rives, MD;  Location: [facility] ENDOSCOPY;  Service: Cardiovascular;  Laterality: N/A;   FOOT SURGERY Right 07/2020   I & D EXTREMITY Right 08/09/2020   Procedure: IRRIGATION AND DEBRIDEMENT OF FOOT WITH IMPLANTATION OF ANTIBIOTIC CEMENT BEADS;  Surgeon: Edwin Cap, DPM;  Location: [facility] OR;  Service: Podiatry;  Laterality: Right;   IRRIGATION AND DEBRIDEMENT FOOT Right 08/09/2020   IRRIGATION AND DEBRIDEMENT OF FOOT WITH IMPLANTATION OF ANTIBIOTIC CEMENT BEADS (Right Foot)   IRRIGATION AND DEBRIDEMENT FOOT Right 08/12/2020   Procedure: Secondary wound  closure and removal of antibiotic beads;  Surgeon: Edwin Cap, DPM;  Location: [facility] OR;  Service: Podiatry;  Laterality: Right;   ROTATOR CUFF REPAIR      There were no vitals filed for this visit.   Subjective Assessment - 05/15/22 1019     Subjective Pt has been consistent with HEP. Pt reports he feels that it is getting better. Especially after doing exercises. Pt states scapular pain is now gone.    Pertinent History DDD lumbar, OA, DM    Patient Stated Goals relieve the pain, get back some of his ROM    Currently in Pain? No/denies                [facility] PT Assessment - 05/15/22 0001       Assessment   Medical Diagnosis M47.812 (ICD-10-CM) - Cervical spondylosis    Referring Provider (PT) Monica Becton, MD    Onset Date/Surgical Date 03/30/22    Hand Dominance Right    Next MD Visit 05/21/22                           [facility] Adult PT Treatment/Exercise - 05/15/22 0001       Neck Exercises: Machines for Strengthening   UBE (Upper Arm Bike) L4 x 4 min alt fwd/bkwd      Neck Exercises: Standing   Other Standing Exercises standing against noodle on wall: alt diagonals, bilat ER, W, neck retraction 2x10  Neck Exercises: Stretches   Other Neck Stretches cervical extension with towel 10sec x 10, snags 10 sec hold x 10    Other Neck Stretches doorway stretch 2 x 30 sec; thoracic extension against pool noodle, thoracic rotations against pool noodle x5                       PT Short Term Goals - 05/05/22 1515       PT SHORT TERM GOAL #1   Title Ind with initial HEP    Time 2    Period Weeks    Status New    Target Date 05/19/22      PT SHORT TERM GOAL #2   Title Pt to demonstrate improved postural awareness to help improve functional outcomes.    Time 4    Period Weeks    Status New               PT Long Term Goals - 05/05/22 1515       PT LONG TERM GOAL #1   Title The patient will be indep with HEP.     Time 6    Period Weeks    Status New    Target Date 06/16/22      PT LONG TERM GOAL #2   Title Improved FOTO to 68 showing functional improvement in the neck    Time 6    Period Weeks    Status New    Target Date 06/16/22      PT LONG TERM GOAL #3   Title Improved cervical strength to 5/5 to help stabilize his neck.    Time 6    Period Weeks    Status New      PT LONG TERM GOAL #4   Title Improved cervical ROM by 10 deg in all planes except R rotation to improve ADL function.    Time 6    Period Weeks    Status New      PT LONG TERM GOAL #5   Title Decreased pain in his neck/scapula by > 80% with ADLs.    Time 6    Period Weeks    Status New                   Plan - 05/15/22 1046     Clinical Impression Statement Reviewed HEP. Continued to work on neck mobility and posterior neck/shoulder/scapular strengthening and stability. Pain has been improving overall.    Personal Factors and Comorbidities Behavior Pattern;Comorbidity 3+    Comorbidities DM (unregulated), HTN, bil RCR and biceps tears, left RC tear    PT Treatment/Interventions ADLs/Self Care Home Management;Cryotherapy;Electrical Stimulation;Moist Heat;Iontophoresis 4mg /ml Dexamethasone;Neuromuscular re-education;Therapeutic exercise;Therapeutic activities;Patient/family education;Manual techniques;Dry needling;Taping;Traction    PT Next Visit Plan cervical and thoracic mobility, postural strength    PT Home Exercise Plan [id number]    Consulted and Agree with Plan of Care Patient             Patient will benefit from skilled therapeutic intervention in order to improve the following deficits and impairments:  Decreased range of motion, Pain, Increased muscle spasms, Impaired UE functional use, Hypomobility, Impaired flexibility, Postural dysfunction, Decreased strength  Visit Diagnosis: Cervicalgia  Muscle weakness (generalized)  Cramp and spasm  Other abnormalities of gait and mobility  Other  symptoms and signs involving the musculoskeletal system     Problem List Patient Active Problem List   Diagnosis Date Noted   Cervical spondylosis 04/09/2022  Hypothyroidism 09/09/2021   Dysrhythmia    Hypertension    Diabetes mellitus without complication (HCC)    Diabetic neuropathy (HCC) 08/14/2020   Charcot's arthropathy 08/14/2020   Normocytic anemia 08/14/2020   Atrial fibrillation (HCC) 08/14/2020   Open wound of right foot with complication    Abscess of right foot    Hypertensive heart disease 08/24/2019   Hypertensive left ventricular hypertrophy, without heart failure 08/24/2019   Type 2 diabetes mellitus without complication, without long-term current use of insulin (HCC) 08/24/2019   Ventricular ectopy 08/24/2019   Essential hypertension 08/24/2019    [facility] April Ma L Jolin Benavides, PT, DPT  05/15/2022, 10:50 AM  [facility] [address] [place] [address] [place], [place], [address] Phone: [phone]   Fax:  [phone]  Name: Dean Rasmussen MRN: [id number] Date of Birth: 1951-11-07

## 2022-05-15 NOTE — Telephone Encounter (Signed)
*  STAT* If patient is at the pharmacy, call can be transferred to refill team.   1. Which medications need to be refilled? (please list name of each medication and dose if known)   rivaroxaban (XARELTO) 20 MG TABS tablet     2. Which pharmacy/location (including street and city if local pharmacy) is medication to be sent to?  [id number] - [place], [place] - [address]. Do they need a 30 day or 90 day supply?  30 day

## 2022-05-15 NOTE — Telephone Encounter (Signed)
Prescription refill request for Xarelto received.  Indication:afib  Last office visit: [place], 12/08/2021 Weight: 88.2 kg  Age: 71 yo  Scr: 1.23, 08/26/2021 CrCl: 69 ml/min   Refill sent.

## 2022-05-18 ENCOUNTER — Ambulatory Visit: Payer: No Typology Code available for payment source | Admitting: Physical Therapy

## 2022-05-18 DIAGNOSIS — R2689 Other abnormalities of gait and mobility: Secondary | ICD-10-CM

## 2022-05-18 DIAGNOSIS — M542 Cervicalgia: Secondary | ICD-10-CM

## 2022-05-18 DIAGNOSIS — M6281 Muscle weakness (generalized): Secondary | ICD-10-CM

## 2022-05-18 DIAGNOSIS — R252 Cramp and spasm: Secondary | ICD-10-CM

## 2022-05-18 DIAGNOSIS — R29898 Other symptoms and signs involving the musculoskeletal system: Secondary | ICD-10-CM

## 2022-05-18 NOTE — Therapy (Signed)
[facility] [place] [address] [place] [address] [place], [place], [address] Phone: [phone]   Fax:  [phone]  Physical Therapy Treatment Rationale for Evaluation and Treatment Rehabilitation  Patient Details  Name: Dean Rasmussen MRN: [id number] Date of Birth: 1951-01-11 Referring Provider (PT): Monica Becton, MD   Encounter Date: 05/18/2022   PT End of Session - 05/18/22 1100     Visit Number 4    Date for PT Re-Evaluation 06/16/22    Authorization Type Aetna    PT Start Time 1100    PT Stop Time 1140    PT Time Calculation (min) 40 min    Activity Tolerance Patient tolerated treatment well    Behavior During Therapy [facility] for tasks assessed/performed             Past Medical History:  Diagnosis Date   Abscess of right foot    Atrial fibrillation (HCC) 08/14/2020   Charcot's arthropathy 08/14/2020   Diabetic neuropathy (HCC) 08/14/2020   Essential hypertension 08/24/2019   Hypertensive left ventricular hypertrophy, without heart failure 08/24/2019   Normocytic anemia 08/14/2020   Open wound of right foot with complication    Pain in right arm 05/22/2018   Type 2 diabetes mellitus without complication, without long-term current use of insulin (HCC) 08/24/2019   Ventricular ectopy 08/24/2019    Past Surgical History:  Procedure Laterality Date   CARDIOVERSION N/A 10/16/2020   Procedure: CARDIOVERSION;  Surgeon: Sande Rives, MD;  Location: [facility] ENDOSCOPY;  Service: Cardiovascular;  Laterality: N/A;   FOOT SURGERY Right 07/2020   I & D EXTREMITY Right 08/09/2020   Procedure: IRRIGATION AND DEBRIDEMENT OF FOOT WITH IMPLANTATION OF ANTIBIOTIC CEMENT BEADS;  Surgeon: Edwin Cap, DPM;  Location: [facility] OR;  Service: Podiatry;  Laterality: Right;   IRRIGATION AND DEBRIDEMENT FOOT Right 08/09/2020   IRRIGATION AND DEBRIDEMENT OF FOOT WITH IMPLANTATION OF ANTIBIOTIC CEMENT BEADS (Right Foot)   IRRIGATION AND  DEBRIDEMENT FOOT Right 08/12/2020   Procedure: Secondary wound closure and removal of antibiotic beads;  Surgeon: Edwin Cap, DPM;  Location[facility] OR;  Service: Podiatry;  Laterality: Right;   ROTATOR CUFF REPAIR      There were no vitals filed for this visit.   Subjective Assessment - 05/18/22 1102     Subjective Pt states he is still feeling good. He notes that he was able to go to the golfing range and did not feel too sore.    Pertinent History DDD lumbar, OA, DM    Patient Stated Goals relieve the pain, get back some of his ROM    Currently in Pain? No/denies                [facility] PT Assessment - 05/18/22 0001       Assessment   Medical Diagnosis M47.812 (ICD-10-CM) - Cervical spondylosis    Referring Provider (PT) Monica Becton, MD    Onset Date/Surgical Date 03/30/22    Hand Dominance Right    Next MD Visit 05/21/22                           [facility] Adult PT Treatment/Exercise - 05/18/22 0001       Neck Exercises: Machines for Strengthening   UBE (Upper Arm Bike) L4 x 4 min alt fwd/bkwd      Neck Exercises: Theraband   Rows 20 reps;Red    Rows Limitations reactive iso  Neck Exercises: Standing   Upper Extremity D2 Flexion;20 reps    UE D2 Limitations bilat - no resistance    Other Standing Exercises standing against noodle on wall: bilat ER (2nd set with yellow tband), W (yellow tband), neck retraction 2x10 (yellow tband)      Neck Exercises: Stretches   Other Neck Stretches cervical extension with towel 10sec x 3, snags 10 sec hold x 3    Other Neck Stretches doorway stretch 2 x 30 sec; thoracic extension against pool noodle, thoracic rotations against pool noodle x10                       PT Short Term Goals - 05/05/22 1515       PT SHORT TERM GOAL #1   Title Ind with initial HEP    Time 2    Period Weeks    Status New    Target Date 05/19/22      PT SHORT TERM GOAL #2   Title Pt to demonstrate improved  postural awareness to help improve functional outcomes.    Time 4    Period Weeks    Status New               PT Long Term Goals - 05/05/22 1515       PT LONG TERM GOAL #1   Title The patient will be indep with HEP.    Time 6    Period Weeks    Status New    Target Date 06/16/22      PT LONG TERM GOAL #2   Title Improved FOTO to 68 showing functional improvement in the neck    Time 6    Period Weeks    Status New    Target Date 06/16/22      PT LONG TERM GOAL #3   Title Improved cervical strength to 5/5 to help stabilize his neck.    Time 6    Period Weeks    Status New      PT LONG TERM GOAL #4   Title Improved cervical ROM by 10 deg in all planes except R rotation to improve ADL function.    Time 6    Period Weeks    Status New      PT LONG TERM GOAL #5   Title Decreased pain in his neck/scapula by > 80% with ADLs.    Time 6    Period Weeks    Status New                   Plan - 05/18/22 1123     Clinical Impression Statement Continued to progress HEP. Able to tolerate yellow tband for strengthening. Improving thoracic mobility.    Personal Factors and Comorbidities Behavior Pattern;Comorbidity 3+    Comorbidities DM (unregulated), HTN, bil RCR and biceps tears, left RC tear    PT Treatment/Interventions ADLs/Self Care Home Management;Cryotherapy;Electrical Stimulation;Moist Heat;Iontophoresis 4mg /ml Dexamethasone;Neuromuscular re-education;Therapeutic exercise;Therapeutic activities;Patient/family education;Manual techniques;Dry needling;Taping;Traction    PT Next Visit Plan cervical and thoracic mobility, postural strength    PT Home Exercise Plan [id number]    Consulted and Agree with Plan of Care Patient             Patient will benefit from skilled therapeutic intervention in order to improve the following deficits and impairments:  Decreased range of motion, Pain, Increased muscle spasms, Impaired UE functional use, Hypomobility,  Impaired flexibility, Postural dysfunction, Decreased strength  Visit  Diagnosis: Cervicalgia  Muscle weakness (generalized)  Cramp and spasm  Other abnormalities of gait and mobility  Other symptoms and signs involving the musculoskeletal system     Problem List Patient Active Problem List   Diagnosis Date Noted   Cervical spondylosis 04/09/2022   Hypothyroidism 09/09/2021   Dysrhythmia    Hypertension    Diabetes mellitus without complication (HCC)    Diabetic neuropathy (HCC) 08/14/2020   Charcot's arthropathy 08/14/2020   Normocytic anemia 08/14/2020   Atrial fibrillation (HCC) 08/14/2020   Open wound of right foot with complication    Abscess of right foot    Hypertensive heart disease 08/24/2019   Hypertensive left ventricular hypertrophy, without heart failure 08/24/2019   Type 2 diabetes mellitus without complication, without long-term current use of insulin (HCC) 08/24/2019   Ventricular ectopy 08/24/2019   Essential hypertension 08/24/2019    [facility] April Dell Ponto, PT, DPT 05/18/2022, 11:36 AM  [facility] [address] [place] [address] [place], [place], [address] Phone: [phone]   Fax:  [phone]  Name: Dean Rasmussen MRN: [id number] Date of Birth: Jan 02, 1951

## 2022-05-21 ENCOUNTER — Ambulatory Visit (INDEPENDENT_AMBULATORY_CARE_PROVIDER_SITE_OTHER): Payer: No Typology Code available for payment source | Admitting: Sports Medicine

## 2022-05-21 ENCOUNTER — Ambulatory Visit: Payer: No Typology Code available for payment source | Admitting: Physical Therapy

## 2022-05-21 ENCOUNTER — Ambulatory Visit (INDEPENDENT_AMBULATORY_CARE_PROVIDER_SITE_OTHER): Payer: No Typology Code available for payment source

## 2022-05-21 DIAGNOSIS — M6281 Muscle weakness (generalized): Secondary | ICD-10-CM

## 2022-05-21 DIAGNOSIS — R29898 Other symptoms and signs involving the musculoskeletal system: Secondary | ICD-10-CM

## 2022-05-21 DIAGNOSIS — M25512 Pain in left shoulder: Secondary | ICD-10-CM

## 2022-05-21 DIAGNOSIS — M47812 Spondylosis without myelopathy or radiculopathy, cervical region: Secondary | ICD-10-CM

## 2022-05-21 DIAGNOSIS — M542 Cervicalgia: Secondary | ICD-10-CM

## 2022-05-21 DIAGNOSIS — M19042 Primary osteoarthritis, left hand: Secondary | ICD-10-CM | POA: Diagnosis not present

## 2022-05-21 DIAGNOSIS — R2689 Other abnormalities of gait and mobility: Secondary | ICD-10-CM

## 2022-05-21 DIAGNOSIS — M25511 Pain in right shoulder: Secondary | ICD-10-CM

## 2022-05-21 DIAGNOSIS — R252 Cramp and spasm: Secondary | ICD-10-CM

## 2022-05-21 DIAGNOSIS — G8929 Other chronic pain: Secondary | ICD-10-CM

## 2022-05-21 NOTE — Therapy (Signed)
[facility] [place] [address] [place], [place], [address] Phone: [phone]   Fax:  [phone]  Physical Therapy Treatment  Patient Details  Name: Kimmy Parish MRN: [id number] Date of Birth: 1951-02-28 Referring Provider (PT): Monica Becton, MD  Rationale for Evaluation and Treatment Rehabilitation  Encounter Date: 05/21/2022   PT End of Session - 05/21/22 0934     Visit Number 5    Date for PT Re-Evaluation 06/16/22    Authorization Type Aetna    PT Start Time 4562    PT Stop Time 1015    PT Time Calculation (min) 41 min    Activity Tolerance Patient tolerated treatment well    Behavior During Therapy [facility] for tasks assessed/performed             Past Medical History:  Diagnosis Date   Abscess of right foot    Atrial fibrillation (HCC) 08/14/2020   Charcot's arthropathy 08/14/2020   Diabetic neuropathy (HCC) 08/14/2020   Essential hypertension 08/24/2019   Hypertensive left ventricular hypertrophy, without heart failure 08/24/2019   Normocytic anemia 08/14/2020   Open wound of right foot with complication    Pain in right arm 05/22/2018   Type 2 diabetes mellitus without complication, without long-term current use of insulin (HCC) 08/24/2019   Ventricular ectopy 08/24/2019    Past Surgical History:  Procedure Laterality Date   CARDIOVERSION N/A 10/16/2020   Procedure: CARDIOVERSION;  Surgeon: Sande Rives, MD;  Location: [facility] ENDOSCOPY;  Service: Cardiovascular;  Laterality: N/A;   FOOT SURGERY Right 07/2020   I & D EXTREMITY Right 08/09/2020   Procedure: IRRIGATION AND DEBRIDEMENT OF FOOT WITH IMPLANTATION OF ANTIBIOTIC CEMENT BEADS;  Surgeon: Edwin Cap, DPM;  Location: [facility] OR;  Service: Podiatry;  Laterality: Right;   IRRIGATION AND DEBRIDEMENT FOOT Right 08/09/2020   IRRIGATION AND DEBRIDEMENT OF FOOT WITH IMPLANTATION OF ANTIBIOTIC CEMENT BEADS (Right Foot)   IRRIGATION AND  DEBRIDEMENT FOOT Right 08/12/2020   Procedure: Secondary wound closure and removal of antibiotic beads;  Surgeon: Edwin Cap, DPM;  Location: [facility] OR;  Service: Podiatry;  Laterality: Right;   ROTATOR CUFF REPAIR      There were no vitals filed for this visit.   Subjective Assessment - 05/21/22 0936     Subjective Pt states he is rushing this morning. Otherwise, he is feeling pretty good. He has a follow up with Dr. Karie Schwalbe today.    Pertinent History DDD lumbar, OA, DM    Patient Stated Goals relieve the pain, get back some of his ROM    Currently in Pain? No/denies                [facility] PT Assessment - 05/21/22 0001       Assessment   Medical Diagnosis M47.812 (ICD-10-CM) - Cervical spondylosis    Referring Provider (PT) Monica Becton, MD    Onset Date/Surgical Date 03/30/22    Hand Dominance Right    Next MD Visit 05/21/22                           [facility] Adult PT Treatment/Exercise - 05/21/22 0001       Neck Exercises: Machines for Strengthening   UBE (Upper Arm Bike) L4 x 4 min alt fwd/bkwd      Neck Exercises: Standing   Other Standing Exercises thoracic extension into flexion x10      Neck Exercises: Seated  Other Seated Exercise On pball post/ant pelvic tilt for spine mobility x10      Neck Exercises: Supine   Neck Retraction 5 reps;10 secs    Cervical Rotation Both;5 reps    Cervical Rotation Limitations SNAGs in rotation 3x5 sec iso    Lateral Flexion Right;Left;5 reps    Lateral Flexion Limitations 5 sec iso    Other Supine Exercise ant/post pelvic tilt x20    Other Supine Exercise shoulder ER red tband 2x10, "W" red tband 2x10      Neck Exercises: Stretches   Other Neck Stretches thoracic extension against wall x10; cervical extension with towel 10sec x 3, snags 10 sec hold x 3    Other Neck Stretches doorway stretch low, mid, high 2 x 30 sec; thoracic rotations x10                       PT Short Term Goals -  05/05/22 1515       PT SHORT TERM GOAL #1   Title Ind with initial HEP    Time 2    Period Weeks    Status New    Target Date 05/19/22      PT SHORT TERM GOAL #2   Title Pt to demonstrate improved postural awareness to help improve functional outcomes.    Time 4    Period Weeks    Status New               PT Long Term Goals - 05/05/22 1515       PT LONG TERM GOAL #1   Title The patient will be indep with HEP.    Time 6    Period Weeks    Status New    Target Date 06/16/22      PT LONG TERM GOAL #2   Title Improved FOTO to 68 showing functional improvement in the neck    Time 6    Period Weeks    Status New    Target Date 06/16/22      PT LONG TERM GOAL #3   Title Improved cervical strength to 5/5 to help stabilize his neck.    Time 6    Period Weeks    Status New      PT LONG TERM GOAL #4   Title Improved cervical ROM by 10 deg in all planes except R rotation to improve ADL function.    Time 6    Period Weeks    Status New      PT LONG TERM GOAL #5   Title Decreased pain in his neck/scapula by > 80% with ADLs.    Time 6    Period Weeks    Status New                   Plan - 05/21/22 0946     Clinical Impression Statement Current HEP continues to work well for pt. Progressed accordingly. Focused on spinal mobility this session.    Personal Factors and Comorbidities Behavior Pattern;Comorbidity 3+    Comorbidities DM (unregulated), HTN, bil RCR and biceps tears, left RC tear    PT Treatment/Interventions ADLs/Self Care Home Management;Cryotherapy;Electrical Stimulation;Moist Heat;Iontophoresis 4mg /ml Dexamethasone;Neuromuscular re-education;Therapeutic exercise;Therapeutic activities;Patient/family education;Manual techniques;Dry needling;Taping;Traction    PT Next Visit Plan cervical and thoracic mobility, postural strength    PT Home Exercise Plan [id number]    Consulted and Agree with Plan of Care Patient  Patient will  benefit from skilled therapeutic intervention in order to improve the following deficits and impairments:  Decreased range of motion, Pain, Increased muscle spasms, Impaired UE functional use, Hypomobility, Impaired flexibility, Postural dysfunction, Decreased strength  Visit Diagnosis: Cervicalgia  Muscle weakness (generalized)  Cramp and spasm  Other abnormalities of gait and mobility  Other symptoms and signs involving the musculoskeletal system     Problem List Patient Active Problem List   Diagnosis Date Noted   Cervical spondylosis 04/09/2022   Hypothyroidism 09/09/2021   Dysrhythmia    Hypertension    Diabetes mellitus without complication (HCC)    Diabetic neuropathy (HCC) 08/14/2020   Charcot's arthropathy 08/14/2020   Normocytic anemia 08/14/2020   Atrial fibrillation (HCC) 08/14/2020   Open wound of right foot with complication    Abscess of right foot    Hypertensive heart disease 08/24/2019   Hypertensive left ventricular hypertrophy, without heart failure 08/24/2019   Type 2 diabetes mellitus without complication, without long-term current use of insulin (HCC) 08/24/2019   Ventricular ectopy 08/24/2019   Essential hypertension 08/24/2019    [facility] April Dell Ponto, PT, DPT 05/21/2022, 10:17 AM  [facility] [address] [address] [place], [place], [address] Phone: [phone]   Fax:  [phone]  Name: Tramel Westbrook MRN: [id number] Date of Birth: [birth year]

## 2022-05-21 NOTE — Assessment & Plan Note (Signed)
 Chronic bilateral upper shoulder pain, likely biceps tendinopathy. Adding some home conditioning, return as needed.

## 2022-05-21 NOTE — Assessment & Plan Note (Signed)
 Dean Rasmussen has done really well with physical therapy and postural training. Arthritis strength Tylenol  also resolved most of his pain. He will continue to work on this. Avoiding NSAIDs due to NOAC treatment.

## 2022-05-21 NOTE — Assessment & Plan Note (Signed)
Swelling with flexion contracture left third PIP. We will get x-rays, adding some hand conditioning. If not sufficiently better in 6 weeks we will do a third PIP injection. I did inform him that we are unlikely to get the finger completely straight.

## 2022-05-21 NOTE — Progress Notes (Signed)
    Procedures performed today:    None.  Independent interpretation of notes and tests performed by another provider:   None.  Brief History, Exam, Impression, and Recommendations:    Cervical spondylosis Author has done really well with physical therapy and postural training. Arthritis strength Tylenol also resolved most of his pain. He will continue to work on this. Avoiding NSAIDs due to NOAC treatment.  Arthritis of hand, left Swelling with flexion contracture left third PIP. We will get x-rays, adding some hand conditioning. If not sufficiently better in 6 weeks we will do a third PIP injection. I did inform him that we are unlikely to get the finger completely straight.  Bilateral shoulder pain Chronic bilateral upper shoulder pain, likely biceps tendinopathy. Adding some home conditioning, return as needed.    ___________________________________________ Ihor Austin. Benjamin Stain, M.D., ABFM., CAQSM. Primary Care and Sports Medicine [facility]  Adjunct Instructor of Family Medicine  [employer]

## 2022-05-25 ENCOUNTER — Ambulatory Visit: Payer: No Typology Code available for payment source | Admitting: Physical Therapy

## 2022-05-25 DIAGNOSIS — R29898 Other symptoms and signs involving the musculoskeletal system: Secondary | ICD-10-CM

## 2022-05-25 DIAGNOSIS — R2689 Other abnormalities of gait and mobility: Secondary | ICD-10-CM

## 2022-05-25 DIAGNOSIS — M542 Cervicalgia: Secondary | ICD-10-CM

## 2022-05-25 DIAGNOSIS — M6281 Muscle weakness (generalized): Secondary | ICD-10-CM

## 2022-05-25 DIAGNOSIS — R252 Cramp and spasm: Secondary | ICD-10-CM

## 2022-05-25 NOTE — Therapy (Signed)
[facility] [place] [address] [place], [place], [address] Phone: [phone]   Fax:  [phone]  Physical Therapy Treatment  Patient Details  Name: Genevieve Vizzini MRN: [id number] Date of Birth: 09-11-1951 Referring Provider (PT): Monica Becton, MD   Encounter Date: 05/25/2022   PT End of Session - 05/25/22 1058     Visit Number 6    Date for PT Re-Evaluation 06/16/22    Authorization Type Aetna    PT Start Time 1100    PT Stop Time 1142    PT Time Calculation (min) 42 min    Activity Tolerance Patient tolerated treatment well    Behavior During Therapy [facility] for tasks assessed/performed             Past Medical History:  Diagnosis Date   Abscess of right foot    Atrial fibrillation (HCC) 08/14/2020   Charcot's arthropathy 08/14/2020   Diabetic neuropathy (HCC) 08/14/2020   Essential hypertension 08/24/2019   Hypertensive left ventricular hypertrophy, without heart failure 08/24/2019   Normocytic anemia 08/14/2020   Open wound of right foot with complication    Pain in right arm 05/22/2018   Type 2 diabetes mellitus without complication, without long-term current use of insulin (HCC) 08/24/2019   Ventricular ectopy 08/24/2019    Past Surgical History:  Procedure Laterality Date   CARDIOVERSION N/A 10/16/2020   Procedure: CARDIOVERSION;  Surgeon: Sande Rives, MD;  Location: [facility] ENDOSCOPY;  Service: Cardiovascular;  Laterality: N/A;   FOOT SURGERY Right 07/2020   I & D EXTREMITY Right 08/09/2020   Procedure: IRRIGATION AND DEBRIDEMENT OF FOOT WITH IMPLANTATION OF ANTIBIOTIC CEMENT BEADS;  Surgeon: Edwin Cap, DPM;  Location: [facility] OR;  Service: Podiatry;  Laterality: Right;   IRRIGATION AND DEBRIDEMENT FOOT Right 08/09/2020   IRRIGATION AND DEBRIDEMENT OF FOOT WITH IMPLANTATION OF ANTIBIOTIC CEMENT BEADS (Right Foot)   IRRIGATION AND DEBRIDEMENT FOOT Right 08/12/2020   Procedure: Secondary wound  closure and removal of antibiotic beads;  Surgeon: Edwin Cap, DPM;  Location: [facility] OR;  Service: Podiatry;  Laterality: Right;   ROTATOR CUFF REPAIR      There were no vitals filed for this visit.   Subjective Assessment - 05/25/22 1101     Subjective Pt reports that his appointment with Dr. Karie Schwalbe was uneventful. Neck continues to feel good. Sore after last session.    Pertinent History DDD lumbar, OA, DM    Patient Stated Goals relieve the pain, get back some of his ROM    Currently in Pain? No/denies                [facility] PT Assessment - 05/25/22 0001       Assessment   Medical Diagnosis M47.812 (ICD-10-CM) - Cervical spondylosis    Referring Provider (PT) Monica Becton, MD    Onset Date/Surgical Date 03/30/22    Hand Dominance Right    Next MD Visit 05/21/22                           [facility] Adult PT Treatment/Exercise - 05/25/22 0001       Neck Exercises: Machines for Strengthening   UBE (Upper Arm Bike) L4 x 4 min alt fwd/bkwd      Neck Exercises: Theraband   Rows 20 reps;Red    Rows Limitations reactive iso    Other Theraband Exercises bow and arrow 2x10 red tband  Neck Exercises: Standing   Neck Retraction 10 reps    Neck Retraction Limitations 3-5 sec iso    Other Standing Exercises standing against noodle on wall: bilat ER (red tband), W (red tband) 2x10, serratus punch red tband 2x10;      Neck Exercises: Stretches   Other Neck Stretches thoracic extension x10; cervical extension against pool noodle 10 sec x 3, snags 10 sec hold x 3    Other Neck Stretches doorway stretch low, mid, high 2 x 30 sec                       PT Short Term Goals - 05/25/22 1142       PT SHORT TERM GOAL #1   Title Ind with initial HEP    Time 2    Period Weeks    Status Achieved    Target Date 05/19/22      PT SHORT TERM GOAL #2   Title Pt to demonstrate improved postural awareness to help improve functional outcomes.    Time 4     Period Weeks    Status Achieved               PT Long Term Goals - 05/05/22 1515       PT LONG TERM GOAL #1   Title The patient will be indep with HEP.    Time 6    Period Weeks    Status New    Target Date 06/16/22      PT LONG TERM GOAL #2   Title Improved FOTO to 68 showing functional improvement in the neck    Time 6    Period Weeks    Status New    Target Date 06/16/22      PT LONG TERM GOAL #3   Title Improved cervical strength to 5/5 to help stabilize his neck.    Time 6    Period Weeks    Status New      PT LONG TERM GOAL #4   Title Improved cervical ROM by 10 deg in all planes except R rotation to improve ADL function.    Time 6    Period Weeks    Status New      PT LONG TERM GOAL #5   Title Decreased pain in his neck/scapula by > 80% with ADLs.    Time 6    Period Weeks    Status New                   Plan - 05/25/22 1123     Clinical Impression Statement Initiated serratus anterior strengthening this session. Continued progressing pt to red tband this session.    Personal Factors and Comorbidities Behavior Pattern;Comorbidity 3+    Comorbidities DM (unregulated), HTN, bil RCR and biceps tears, left RC tear    PT Treatment/Interventions ADLs/Self Care Home Management;Cryotherapy;Electrical Stimulation;Moist Heat;Iontophoresis 4mg /ml Dexamethasone;Neuromuscular re-education;Therapeutic exercise;Therapeutic activities;Patient/family education;Manual techniques;Dry needling;Taping;Traction    PT Next Visit Plan cervical and thoracic mobility, postural strength    PT Home Exercise Plan [id number]    Consulted and Agree with Plan of Care Patient             Patient will benefit from skilled therapeutic intervention in order to improve the following deficits and impairments:  Decreased range of motion, Pain, Increased muscle spasms, Impaired UE functional use, Hypomobility, Impaired flexibility, Postural dysfunction, Decreased  strength  Visit Diagnosis: Cervicalgia  Muscle weakness (generalized)  Cramp and spasm  Other abnormalities of gait and mobility  Other symptoms and signs involving the musculoskeletal system     Problem List Patient Active Problem List   Diagnosis Date Noted   Arthritis of hand, left 05/21/2022   Bilateral shoulder pain 05/21/2022   Cervical spondylosis 04/09/2022   Hypothyroidism 09/09/2021   Dysrhythmia    Hypertension    Diabetes mellitus without complication (HCC)    Diabetic neuropathy (HCC) 08/14/2020   Charcot's arthropathy 08/14/2020   Normocytic anemia 08/14/2020   Atrial fibrillation (HCC) 08/14/2020   Open wound of right foot with complication    Abscess of right foot    Hypertensive heart disease 08/24/2019   Hypertensive left ventricular hypertrophy, without heart failure 08/24/2019   Type 2 diabetes mellitus without complication, without long-term current use of insulin (HCC) 08/24/2019   Ventricular ectopy 08/24/2019   Essential hypertension 08/24/2019    [facility] April Ma L Jonte Shiller, PT, DPT 05/25/2022, 11:45 AM  [facility] [address] [place], [place], [address] Phone: [phone]   Fax:  [phone]  Name: Holger Arlinghaus MRN: [id number] Date of Birth: 07-27-1951

## 2022-05-28 ENCOUNTER — Encounter: Payer: Self-pay | Admitting: Physical Therapy

## 2022-05-28 ENCOUNTER — Ambulatory Visit: Payer: No Typology Code available for payment source | Admitting: Physical Therapy

## 2022-05-28 DIAGNOSIS — R252 Cramp and spasm: Secondary | ICD-10-CM

## 2022-05-28 DIAGNOSIS — M6281 Muscle weakness (generalized): Secondary | ICD-10-CM

## 2022-05-28 DIAGNOSIS — R29898 Other symptoms and signs involving the musculoskeletal system: Secondary | ICD-10-CM

## 2022-05-28 DIAGNOSIS — M542 Cervicalgia: Secondary | ICD-10-CM

## 2022-05-28 DIAGNOSIS — R2689 Other abnormalities of gait and mobility: Secondary | ICD-10-CM

## 2022-05-28 NOTE — Therapy (Signed)
[place], [place], [address] Phone: [phone]   Fax:  [phone]  Physical Therapy Treatment  Patient Details  Name: Dean Rasmussen MRN: [id number] Date of Birth: Apr 11, 1951 Referring Provider (PT): Silverio Decamp, MD   Encounter Date: 05/28/2022  Rationale for Evaluation and Treatment Rehabilitation   PT End of Session - 05/28/22 1110     Visit Number 7    Date for PT Re-Evaluation 06/16/22    Authorization Type Aetna    PT Start Time 1106    PT Stop Time 1148    PT Time Calculation (min) 42 min    Activity Tolerance Patient tolerated treatment well    Behavior During Therapy [facility] for tasks assessed/performed             Past Medical History:  Diagnosis Date   Abscess of right foot    Atrial fibrillation ([place]) 08/14/2020   Charcot's arthropathy 08/14/2020   Diabetic neuropathy ([place]) 08/14/2020   Essential hypertension 08/24/2019   Hypertensive left ventricular hypertrophy, without heart failure 08/24/2019   Normocytic anemia 08/14/2020   Open wound of right foot with complication    Pain in right arm 05/22/2018   Type 2 diabetes mellitus without complication, without long-term current use of insulin ([place]) 08/24/2019   Ventricular ectopy 08/24/2019    Past Surgical History:  Procedure Laterality Date   CARDIOVERSION N/A 10/16/2020   Procedure: CARDIOVERSION;  Surgeon: Geralynn Rile, MD;  Location: [place];  Service: Cardiovascular;  Laterality: N/A;   FOOT SURGERY Right 07/2020   I & D EXTREMITY Right 08/09/2020   Procedure: IRRIGATION AND DEBRIDEMENT OF FOOT WITH IMPLANTATION OF ANTIBIOTIC CEMENT BEADS;  Surgeon: Criselda Peaches, DPM;  Location: [place];  Service: Podiatry;  Laterality: Right;   IRRIGATION AND DEBRIDEMENT FOOT Right 08/09/2020   IRRIGATION AND DEBRIDEMENT OF FOOT WITH IMPLANTATION OF ANTIBIOTIC CEMENT BEADS (Right Foot)   IRRIGATION AND  DEBRIDEMENT FOOT Right 08/12/2020   Procedure: Secondary wound closure and removal of antibiotic beads;  Surgeon: Criselda Peaches, DPM;  Location: [place];  Service: Podiatry;  Laterality: Right;   ROTATOR CUFF REPAIR      There were no vitals filed for this visit.   Subjective Assessment - 05/28/22 1111     Subjective Patient reporting overall improvements. some pain yesterday but better today.    Pertinent History DDD lumbar, OA, DM    Patient Stated Goals relieve the pain, get back some of his ROM    Currently in Pain? No/denies                [facility] PT Assessment - 05/28/22 0001       AROM   Cervical Extension 11    Cervical - Right Side Bend 18    Cervical - Left Side Bend 12    Cervical - Right Rotation 60    Cervical - Left Rotation 42      Strength   Overall Strength Comments cervical 5/5 and no pain                           [facility] Adult PT Treatment/Exercise - 05/28/22 0001       Neck Exercises: Machines for Strengthening   UBE (Upper Arm Bike) L4 x 5 min alt fwd/bkwd      Neck Exercises: Theraband   Rows 20 reps;Green    Other Theraband Exercises bow and 1x5 red  tband, 1x 10 GTB      Neck Exercises: Standing   Neck Retraction 10 reps    Neck Retraction Limitations 3-5 sec iso    Other Standing Exercises standing against noodle on wall: bilat ER (red tband), W (red tband) 2x10, unable to tolerated serratus today due to left shoulder      Neck Exercises: Seated   Cervical Rotation Both;5 reps    Lateral Flexion Both;5 reps    Other Seated Exercise thoracic extension in chair with neck supported by hands x 10; attempted serratus push on mat table in standing but difficult today due to left shoulder                       PT Short Term Goals - 05/25/22 1142       PT SHORT TERM GOAL #1   Title Ind with initial HEP    Time 2    Period Weeks    Status Achieved    Target Date 05/19/22      PT SHORT TERM GOAL #2   Title Pt  to demonstrate improved postural awareness to help improve functional outcomes.    Time 4    Period Weeks    Status Achieved               PT Long Term Goals - 05/28/22 1116       PT LONG TERM GOAL #1   Title The patient will be indep with HEP.    Status Partially Met      PT LONG TERM GOAL #3   Title Improved cervical strength to 5/5 to help stabilize his neck.    Status Achieved      PT LONG TERM GOAL #4   Title Improved cervical ROM by 10 deg in all planes except R rotation to improve ADL function.    Status On-going      PT LONG TERM GOAL #5   Title Decreased pain in his neck/scapula by > 80% with ADLs.    Status Achieved                   Plan - 05/28/22 1119     Clinical Impression Statement Patrik is progressing well toward LTGs. He is still limited mainly with ROM, but functionally he notices improvement in rotation with driving. He still has marked deficits with cervical ext and SB, but pain has resolved. He will benefit from continued skilled PT to address these defitcits.    Comorbidities DM (unregulated), HTN, bil RCR and biceps tears, left RC tear    PT Frequency 2x / week    PT Duration 6 weeks    PT Treatment/Interventions ADLs/Self Care Home Management;Cryotherapy;Electrical Stimulation;Moist Heat;Iontophoresis 4mg /ml Dexamethasone;Neuromuscular re-education;Therapeutic exercise;Therapeutic activities;Patient/family education;Manual techniques;Dry needling;Taping;Traction    PT Next Visit Plan continue with cervical and thoracic mobility, postural strength    PT Home Exercise Plan [id number]    Consulted and Agree with Plan of Care Patient             Patient will benefit from skilled therapeutic intervention in order to improve the following deficits and impairments:  Decreased range of motion, Pain, Increased muscle spasms, Impaired UE functional use, Hypomobility, Impaired flexibility, Postural dysfunction, Decreased strength  Visit  Diagnosis: Cervicalgia  Muscle weakness (generalized)  Cramp and spasm  Other abnormalities of gait and mobility  Other symptoms and signs involving the musculoskeletal system     Problem List Patient Active Problem List  Diagnosis Date Noted   Arthritis of hand, left 05/21/2022   Bilateral shoulder pain 05/21/2022   Cervical spondylosis 04/09/2022   Hypothyroidism 09/09/2021   Dysrhythmia    Hypertension    Diabetes mellitus without complication ([place])    Diabetic neuropathy ([place]) 08/14/2020   Charcot's arthropathy 08/14/2020   Normocytic anemia 08/14/2020   Atrial fibrillation ([place]) 08/14/2020   Open wound of right foot with complication    Abscess of right foot    Hypertensive heart disease 08/24/2019   Hypertensive left ventricular hypertrophy, without heart failure 08/24/2019   Type 2 diabetes mellitus without complication, without long-term current use of insulin ([place]) 08/24/2019   Ventricular ectopy 08/24/2019   Essential hypertension 08/24/2019    Madelyn Flavors, PT 05/28/2022, 12:41 PM  [place], [place], [address] Phone: [phone]   Fax:  [phone]  Name: Devonne Lalani MRN: [id number] Date of Birth: [birth year]

## 2022-06-04 ENCOUNTER — Ambulatory Visit: Payer: No Typology Code available for payment source | Attending: Sports Medicine | Admitting: Physical Therapy

## 2022-06-04 DIAGNOSIS — R29898 Other symptoms and signs involving the musculoskeletal system: Secondary | ICD-10-CM | POA: Diagnosis present

## 2022-06-04 DIAGNOSIS — R252 Cramp and spasm: Secondary | ICD-10-CM | POA: Insufficient documentation

## 2022-06-04 DIAGNOSIS — M542 Cervicalgia: Secondary | ICD-10-CM | POA: Diagnosis not present

## 2022-06-04 DIAGNOSIS — M6281 Muscle weakness (generalized): Secondary | ICD-10-CM | POA: Insufficient documentation

## 2022-06-04 NOTE — Therapy (Signed)
[facility] [address] [place], [place], [address] Phone: [phone]   Fax:  [phone]  Physical Therapy Treatment  Patient Details  Name: Dean Rasmussen MRN: [id number] Date of Birth: 12-13-1950 Referring Provider (PT): Monica Becton, MD  Rationale for Evaluation and Treatment Rehabilitation  Encounter Date: 06/04/2022   PT End of Session - 06/04/22 0847     Visit Number 8    Date for PT Re-Evaluation 06/16/22    Authorization Type [employer]    PT Start Time [phone]    PT Stop Time 0930    PT Time Calculation (min) 43 min    Activity Tolerance Patient tolerated treatment well    Behavior During Therapy [facility] for tasks assessed/performed             Past Medical History:  Diagnosis Date   Abscess of right foot    Atrial fibrillation (HCC) 08/14/2020   Charcot's arthropathy 08/14/2020   Diabetic neuropathy (HCC) 08/14/2020   Essential hypertension 08/24/2019   Hypertensive left ventricular hypertrophy, without heart failure 08/24/2019   Normocytic anemia 08/14/2020   Open wound of right foot with complication    Pain in right arm 05/22/2018   Type 2 diabetes mellitus without complication, without long-term current use of insulin (HCC) 08/24/2019   Ventricular ectopy 08/24/2019    Past Surgical History:  Procedure Laterality Date   CARDIOVERSION N/A 10/16/2020   Procedure: CARDIOVERSION;  Surgeon: Sande Rives, MD;  Location[facility] ENDOSCOPY;  Service: Cardiovascular;  Laterality: N/A;   FOOT SURGERY Right 07/2020   I & D EXTREMITY Right 08/09/2020   Procedure: IRRIGATION AND DEBRIDEMENT OF FOOT WITH IMPLANTATION OF ANTIBIOTIC CEMENT BEADS;  Surgeon: Edwin Cap, DPM;  Location: [facility] OR;  Service: Podiatry;  Laterality: Right;   IRRIGATION AND DEBRIDEMENT FOOT Right 08/09/2020   IRRIGATION AND DEBRIDEMENT OF FOOT WITH IMPLANTATION OF ANTIBIOTIC CEMENT BEADS (Right Foot)   IRRIGATION AND  DEBRIDEMENT FOOT Right 08/12/2020   Procedure: Secondary wound closure and removal of antibiotic beads;  Surgeon: Edwin Cap, DPM;  Location: [facility] OR;  Service: Podiatry;  Laterality: Right;   ROTATOR CUFF REPAIR      There were no vitals filed for this visit.   Subjective Assessment - 06/04/22 0848     Subjective Pt states he's been at the beach. Just got home last night. Was not able to do his exercises.    Pertinent History DDD lumbar, OA, DM    Patient Stated Goals relieve the pain, get back some of his ROM    Currently in Pain? No/denies                [facility] PT Assessment - 06/04/22 0001       Assessment   Medical Diagnosis M47.812 (ICD-10-CM) - Cervical spondylosis    Referring Provider (PT) Monica Becton, MD    Onset Date/Surgical Date 03/30/22    Hand Dominance Right    Next MD Visit 05/21/22                           [facility] Adult PT Treatment/Exercise - 06/04/22 0001       Neck Exercises: Machines for Strengthening   UBE (Upper Arm Bike) L4 x 5 min alt fwd/bkwd      Neck Exercises: Prone   Axial Exension 10 reps    Axial Extension Limitations 3 sec    Neck Retraction 10 reps;3 secs  Other Prone Exercise "I", "W", "Y" 2x10      Neck Exercises: Stretches   Other Neck Stretches pball forward flexion stretch + lateral flexion x10 sec each    Other Neck Stretches doorway stretch low, mid, high 2 x 30 sec      Manual Therapy   Manual Therapy Joint mobilization;Passive ROM    Joint Mobilization cervical and thoracic UPAs, CPAs and side glides for rotation    Passive ROM Cervical PROM in all directions                       PT Short Term Goals - 05/25/22 1142       PT SHORT TERM GOAL #1   Title Ind with initial HEP    Time 2    Period Weeks    Status Achieved    Target Date 05/19/22      PT SHORT TERM GOAL #2   Title Pt to demonstrate improved postural awareness to help improve functional outcomes.    Time 4     Period Weeks    Status Achieved               PT Long Term Goals - 05/28/22 1116       PT LONG TERM GOAL #1   Title The patient will be indep with HEP.    Status Partially Met      PT LONG TERM GOAL #3   Title Improved cervical strength to 5/5 to help stabilize his neck.    Status Achieved      PT LONG TERM GOAL #4   Title Improved cervical ROM by 10 deg in all planes except R rotation to improve ADL function.    Status On-going      PT LONG TERM GOAL #5   Title Decreased pain in his neck/scapula by > 80% with ADLs.    Status Achieved                   Plan - 06/04/22 0912     Clinical Impression Statement Session focused on working on his thoracic and cervical hypomobilities. Continued to progress posterior shoulder girdle strengthening. Pain is no longer as much of an issue.    Personal Factors and Comorbidities Behavior Pattern;Comorbidity 3+    Comorbidities DM (unregulated), HTN, bil RCR and biceps tears, left RC tear    PT Frequency 2x / week    PT Duration 6 weeks    PT Treatment/Interventions ADLs/Self Care Home Management;Cryotherapy;Electrical Stimulation;Moist Heat;Iontophoresis 4mg /ml Dexamethasone;Neuromuscular re-education;Therapeutic exercise;Therapeutic activities;Patient/family education;Manual techniques;Dry needling;Taping;Traction    PT Next Visit Plan continue with cervical and thoracic mobility, postural strength    PT Home Exercise Plan [email or web address]    Consulted and Agree with Plan of Care Patient             Patient will benefit from skilled therapeutic intervention in order to improve the following deficits and impairments:  Decreased range of motion, Pain, Increased muscle spasms, Impaired UE functional use, Hypomobility, Impaired flexibility, Postural dysfunction, Decreased strength  Visit Diagnosis: Cervicalgia  Muscle weakness (generalized)  Cramp and spasm  Other symptoms and signs involving the musculoskeletal  system     Problem List Patient Active Problem List   Diagnosis Date Noted   Arthritis of hand, left 05/21/2022   Bilateral shoulder pain 05/21/2022   Cervical spondylosis 04/09/2022   Hypothyroidism 09/09/2021   Dysrhythmia    Hypertension    Diabetes mellitus without complication ([place])  Diabetic neuropathy ([place]) 08/14/2020   Charcot's arthropathy 08/14/2020   Normocytic anemia 08/14/2020   Atrial fibrillation ([place]) 08/14/2020   Open wound of right foot with complication    Abscess of right foot    Hypertensive heart disease 08/24/2019   Hypertensive left ventricular hypertrophy, without heart failure 08/24/2019   Type 2 diabetes mellitus without complication, without long-term current use of insulin ([place]) 08/24/2019   Ventricular ectopy 08/24/2019   Essential hypertension 08/24/2019    [facility] April Ma L Shaiann Mcmanamon, PT, DPT 06/04/2022, 9:32 AM  [facility] [place], [place], [address] Phone: [phone]   Fax:  [phone]  Name: Dean Rasmussen MRN: [id number] Date of Birth: 1951-09-19

## 2022-06-08 ENCOUNTER — Ambulatory Visit: Payer: No Typology Code available for payment source | Admitting: Physical Therapy

## 2022-06-11 ENCOUNTER — Encounter: Payer: No Typology Code available for payment source | Admitting: Physical Therapy

## 2022-06-16 ENCOUNTER — Ambulatory Visit: Payer: No Typology Code available for payment source | Admitting: Physical Therapy

## 2022-06-16 DIAGNOSIS — M6281 Muscle weakness (generalized): Secondary | ICD-10-CM

## 2022-06-16 DIAGNOSIS — M542 Cervicalgia: Secondary | ICD-10-CM | POA: Diagnosis not present

## 2022-06-16 NOTE — Therapy (Signed)
[place], [place], [address] Phone: [phone]   Fax:  [phone]  Physical Therapy Treatment and Discharge  Patient Details  Name: Dean Rasmussen MRN: [id number] Date of Birth: February 20, 1951 Referring Provider (PT): Silverio Decamp, MD   Encounter Date: 06/16/2022 Rationale for Evaluation and Treatment Rehabilitation   PT End of Session - 06/16/22 1010     Visit Number 9    PT Start Time 0930    PT Stop Time 1010    PT Time Calculation (min) 40 min    Activity Tolerance Patient tolerated treatment well    Behavior During Therapy [facility] for tasks assessed/performed             Past Medical History:  Diagnosis Date   Abscess of right foot    Atrial fibrillation ([place]) 08/14/2020   Charcot's arthropathy 08/14/2020   Diabetic neuropathy ([place]) 08/14/2020   Essential hypertension 08/24/2019   Hypertensive left ventricular hypertrophy, without heart failure 08/24/2019   Normocytic anemia 08/14/2020   Open wound of right foot with complication    Pain in right arm 05/22/2018   Type 2 diabetes mellitus without complication, without long-term current use of insulin ([place]) 08/24/2019   Ventricular ectopy 08/24/2019    Past Surgical History:  Procedure Laterality Date   CARDIOVERSION N/A 10/16/2020   Procedure: CARDIOVERSION;  Surgeon: Geralynn Rile, MD;  Location: [place];  Service: Cardiovascular;  Laterality: N/A;   FOOT SURGERY Right 07/2020   I & D EXTREMITY Right 08/09/2020   Procedure: IRRIGATION AND DEBRIDEMENT OF FOOT WITH IMPLANTATION OF ANTIBIOTIC CEMENT BEADS;  Surgeon: Criselda Peaches, DPM;  Location: [place];  Service: Podiatry;  Laterality: Right;   IRRIGATION AND DEBRIDEMENT FOOT Right 08/09/2020   IRRIGATION AND DEBRIDEMENT OF FOOT WITH IMPLANTATION OF ANTIBIOTIC CEMENT BEADS (Right Foot)   IRRIGATION AND DEBRIDEMENT FOOT Right 08/12/2020   Procedure: Secondary  wound closure and removal of antibiotic beads;  Surgeon: Criselda Peaches, DPM;  Location: [place];  Service: Podiatry;  Laterality: Right;   ROTATOR CUFF REPAIR      There were no vitals filed for this visit.   Subjective Assessment - 06/16/22 0934     Subjective Pt states he was sick last week so only did a few exercises. He states he does not have pain    Patient Stated Goals relieve the pain, get back some of his ROM    Currently in Pain? No/denies                [facility] PT Assessment - 06/16/22 0001       Assessment   Medical Diagnosis M47.812 (ICD-10-CM) - Cervical spondylosis    Referring Provider (PT) Silverio Decamp, MD    Onset Date/Surgical Date 03/30/22    Hand Dominance Right      Observation/Other Assessments   Focus on Therapeutic Outcomes (FOTO)  65      AROM   Cervical Extension 20    Cervical - Right Side Bend 24    Cervical - Left Side Bend 11    Cervical - Right Rotation 50    Cervical - Left Rotation 60                           [facility] Adult PT Treatment/Exercise - 06/16/22 0001       Neck Exercises: Machines for Strengthening   UBE (Upper Arm Bike) L4 x 5  min alt fwd/bkwd      Neck Exercises: Theraband   Rows 20 reps;Green    Rows Limitations then bow and arrow x 20 green TB    Other Theraband Exercises bilat ER red TB 2 x 10, W red TB 2 x 10    Other Theraband Exercises hug with resistance red TB x 20      Neck Exercises: Stretches   Other Neck Stretches snags for rotation 3 x 10 sec bilat, cervical extension with towel 3 x 10 sec    Other Neck Stretches doorway stretch low, mid, high 2 x 30 sec                     PT Education - 06/16/22 0957     Education Details updated HEP    Person(s) Educated Patient    Methods Explanation;Demonstration;Handout    Comprehension Returned demonstration;Verbalized understanding              PT Short Term Goals - 05/25/22 1142       PT SHORT TERM GOAL #1    Title Ind with initial HEP    Time 2    Period Weeks    Status Achieved    Target Date 05/19/22      PT SHORT TERM GOAL #2   Title Pt to demonstrate improved postural awareness to help improve functional outcomes.    Time 4    Period Weeks    Status Achieved               PT Long Term Goals - 06/16/22 6237       PT LONG TERM GOAL #1   Title The patient will be indep with HEP.    Status Achieved      PT LONG TERM GOAL #2   Title Improved FOTO to 68 showing functional improvement in the neck    Status Partially Met      PT LONG TERM GOAL #3   Title Improved cervical strength to 5/5 to help stabilize his neck.    Status Achieved      PT LONG TERM GOAL #4   Title Improved cervical ROM by 10 deg in all planes except R rotation to improve ADL function.    Status Partially Met      PT LONG TERM GOAL #5   Title Decreased pain in his neck/scapula by > 80% with ADLs.    Status Achieved                   Plan - 06/16/22 1011     Clinical Impression Statement Pt has improved knowledge of HEP, decreased pain and improved ROM. he still continues with decreased cervical ROM but feels comfortable with current functional level and is ready for d/c to HEP    PT Next Visit Plan d/c    PT Home Exercise Plan [email or web address]    Consulted and Agree with Plan of Care Patient             Patient will benefit from skilled therapeutic intervention in order to improve the following deficits and impairments:     Visit Diagnosis: Cervicalgia  Muscle weakness (generalized)     Problem List Patient Active Problem List   Diagnosis Date Noted   Arthritis of hand, left 05/21/2022   Bilateral shoulder pain 05/21/2022   Cervical spondylosis 04/09/2022   Hypothyroidism 09/09/2021   Dysrhythmia    Hypertension    Diabetes mellitus without complication (  [place])    Diabetic neuropathy ([place]) 08/14/2020   Charcot's arthropathy 08/14/2020   Normocytic anemia 08/14/2020   Atrial  fibrillation ([place]) 08/14/2020   Open wound of right foot with complication    Abscess of right foot    Hypertensive heart disease 08/24/2019   Hypertensive left ventricular hypertrophy, without heart failure 08/24/2019   Type 2 diabetes mellitus without complication, without long-term current use of insulin ([place]) 08/24/2019   Ventricular ectopy 08/24/2019   Essential hypertension 08/24/2019   PHYSICAL THERAPY DISCHARGE SUMMARY  Visits from Start of Care: 9  Current functional level related to goals / functional outcomes: Decreased pain   Remaining deficits: Decreased ROM   Education / Equipment: HEP   Patient agrees to discharge. Patient goals were partially met. Patient is being discharged due to being pleased with the current functional level.  Heydi Swango, PT 06/16/2022, 10:13 AM  [facility] [place], [place], [address] Phone: [phone]   Fax:  [phone]  Name: Dean Rasmussen MRN: [id number] Date of Birth: [birth year]

## 2022-06-16 NOTE — Patient Instructions (Signed)
Access Code: [email or web address] URL: [email or web address] Date: 06/16/2022 Prepared by: Reggy Eye  Exercises - Shoulder External Rotation and Scapular Retraction with Resistance  - 1 x daily - 7 x weekly - 2 sets - 10 reps - Shoulder W - External Rotation with Resistance  - 1 x daily - 7 x weekly - 2 sets - 10 reps - Dynamic Hug with Resistance  - 1 x daily - 7 x weekly - 2 sets - 10 reps - Drawing Bow  - 1 x daily - 7 x weekly - 2 sets - 10 reps - Standing Bilateral Low Shoulder Row with Anchored Resistance  - 1 x daily - 7 x weekly - 2 sets - 10 reps - Cervical Extension AROM with Strap  - 1 x daily - 7 x weekly - 1 sets - 10 reps - 10 sec hold - Seated Assisted Cervical Rotation with Towel  - 1 x daily - 7 x weekly - 1 sets - 10 reps - 10 sec hold - Doorway Pec Stretch at 90 Degrees Abduction  - 1 x daily - 7 x weekly - 1 sets - 3 reps - 20-30 sec hold

## 2022-06-18 ENCOUNTER — Ambulatory Visit: Payer: No Typology Code available for payment source | Admitting: Physical Therapy

## 2022-07-02 ENCOUNTER — Ambulatory Visit (INDEPENDENT_AMBULATORY_CARE_PROVIDER_SITE_OTHER): Payer: No Typology Code available for payment source | Admitting: Sports Medicine

## 2022-07-02 DIAGNOSIS — G8929 Other chronic pain: Secondary | ICD-10-CM | POA: Diagnosis not present

## 2022-07-02 DIAGNOSIS — M25512 Pain in left shoulder: Secondary | ICD-10-CM

## 2022-07-02 DIAGNOSIS — M47812 Spondylosis without myelopathy or radiculopathy, cervical region: Secondary | ICD-10-CM

## 2022-07-02 DIAGNOSIS — M19042 Primary osteoarthritis, left hand: Secondary | ICD-10-CM | POA: Diagnosis not present

## 2022-07-02 DIAGNOSIS — M25511 Pain in right shoulder: Secondary | ICD-10-CM | POA: Diagnosis not present

## 2022-07-02 NOTE — Assessment & Plan Note (Signed)
Dean Rasmussen returns, he is a pleasant 71 year old male, bilateral hand osteoarthritis, worse left third PIP with a slight flexion contracture and some swelling, he has not really been consistent with therapy and would like to do at least another 6 weeks of this before considering injection.

## 2022-07-02 NOTE — Assessment & Plan Note (Signed)
As above, shoulder pain and neck pain, shoulder pain may be referred from the neck, overall doing well with this. Avoiding NSAIDs due to NOAC treatment.

## 2022-07-02 NOTE — Assessment & Plan Note (Signed)
Suspected biceps tendinopathy, has not been consistent with conditioning and would like to do this first before considering injections.   Return in 6 weeks for reevaluation.

## 2022-07-02 NOTE — Progress Notes (Signed)
    Procedures performed today:    None.  Independent interpretation of notes and tests performed by another provider:   None.  Brief History, Exam, Impression, and Recommendations:    Arthritis of hand, left Dean Rasmussen returns, he is a pleasant 71 year old male, bilateral hand osteoarthritis, worse left third PIP with a slight flexion contracture and some swelling, he has not really been consistent with therapy and would like to do at least another 6 weeks of this before considering injection.  Bilateral shoulder pain Suspected biceps tendinopathy, has not been consistent with conditioning and would like to do this first before considering injections.   Return in 6 weeks for reevaluation.  Cervical spondylosis As above, shoulder pain and neck pain, shoulder pain may be referred from the neck, overall doing well with this. Avoiding NSAIDs due to NOAC treatment.  I spent 30 minutes of total time managing this patient today, this includes chart review, face to face, and non-face to face time.  We did spend a great deal of time talking about a house that [name] and his family are moving into.  ____________________________________________ Dean Rasmussen. Benjamin Stain, M.D., ABFM., CAQSM., AME. Primary Care and Sports Medicine [facility]  Adjunct Professor of Family Medicine  [employer]  Restaurant manager, fast food

## 2022-07-06 ENCOUNTER — Ambulatory Visit (INDEPENDENT_AMBULATORY_CARE_PROVIDER_SITE_OTHER): Payer: No Typology Code available for payment source | Admitting: Podiatry

## 2022-07-06 DIAGNOSIS — L84 Corns and callosities: Secondary | ICD-10-CM | POA: Diagnosis not present

## 2022-07-06 DIAGNOSIS — E1149 Type 2 diabetes mellitus with other diabetic neurological complication: Secondary | ICD-10-CM | POA: Diagnosis not present

## 2022-07-09 NOTE — Progress Notes (Signed)
Subjective: 71 year old male presents the office for diabetic foot evaluation and for recurrent callus on his right foot.  Get some occasional discomfort but denies any opening or drainage.  No swelling or redness that he reports.  Denies any fevers or chills.  No other concerns today.   Last A1c was 9.4 on Apr 22, 2021  Objective: AAO x3, NAD DP/PT pulses palpable bilaterally, CRT less than 3 seconds Sensation decreased with Semmes Weinstein monofilament Mild callus on the previous wound on the right foot. No skin breakdown noted today.  He reports occasional tenderness on the callus as well as with point prominence but no specific area pinpoint tenderness today. No pain with calf compression, swelling, warmth, erythema  Assessment: Preulcerative callus right foot, uncontrolled diabetes with neuropathy  Plan: -All treatment options discussed with the patient including all alternatives, risks, complications.  -I sharply debrided the hyperkeratotic lesion to the any complications or bleeding.  I did recommend moisturizer and offloading daily.  -Continue supportive shoe gear.  -Daily foot inspection, glucose control  Return in about 3 months (around 10/06/2022).  Vivi Barrack DPM

## 2022-07-15 NOTE — Progress Notes (Signed)
HPI: Follow-up atrial fibrillation.  Previously followed by Dr. Dulce Sellar.  Echocardiogram September 2021 showed ejection fraction 55 to 60%, severe left ventricular hypertrophy.  He underwent cardioversion of atrial fibrillation in November 2021.  ABIs January 2022 normal.  Calcium score January 2023 1144 which was 86 percentile and dilated aortic root at 41 mm.  Nuclear study ordered but not performed.  Since last seen, patient denies dyspnea, chest pain, palpitations or syncope.  Occasional minimal pedal edema.  Current Outpatient Medications  Medication Sig Dispense Refill   acetaminophen (TYLENOL) 650 MG CR tablet Take 1 tablet (650 mg total) by mouth every 8 (eight) hours as needed for pain. 90 tablet 3   cetirizine (ZYRTEC) 10 MG tablet Take 10 mg by mouth daily.     glimepiride (AMARYL) 4 MG tablet Take 1 tablet (4 mg total) by mouth daily with supper. 90 tablet 0   levothyroxine (SYNTHROID) 25 MCG tablet Take 1 tablet (25 mcg total) by mouth daily before breakfast. 90 tablet 0   MAGNESIUM MALATE PO Take 1-2 tablets by mouth See admin instructions. 2 tablets in the evening.     metFORMIN (GLUCOPHAGE) 1000 MG tablet Take 2 tablets (2,000 mg total) by mouth daily with supper. 180 tablet 0   metoprolol succinate (TOPROL-XL) 100 MG 24 hr tablet TAKE 1 TABLET BY MOUTH EVERY DAY 90 tablet 3   ONETOUCH VERIO test strip 1 each 2 (two) times daily.     Probiotic Product (PROBIOTIC PO) Take 1 capsule by mouth in the morning and at bedtime.     rivaroxaban (XARELTO) 20 MG TABS tablet Take 1 tablet (20 mg total) by mouth daily with supper. 30 tablet 5   No current facility-administered medications for this visit.     Past Medical History:  Diagnosis Date   Abscess of right foot    Atrial fibrillation (HCC) 08/14/2020   Charcot's arthropathy 08/14/2020   Diabetic neuropathy (HCC) 08/14/2020   Essential hypertension 08/24/2019   Hypertensive left ventricular hypertrophy, without heart  failure 08/24/2019   Normocytic anemia 08/14/2020   Open wound of right foot with complication    Pain in right arm 05/22/2018   Type 2 diabetes mellitus without complication, without long-term current use of insulin (HCC) 08/24/2019   Ventricular ectopy 08/24/2019    Past Surgical History:  Procedure Laterality Date   CARDIOVERSION N/A 10/16/2020   Procedure: CARDIOVERSION;  Surgeon: Sande Rives, MD;  Location: [facility] ENDOSCOPY;  Service: Cardiovascular;  Laterality: N/A;   FOOT SURGERY Right 07/2020   I & D EXTREMITY Right 08/09/2020   Procedure: IRRIGATION AND DEBRIDEMENT OF FOOT WITH IMPLANTATION OF ANTIBIOTIC CEMENT BEADS;  Surgeon: Edwin Cap, DPM;  Location: [facility] OR;  Service: Podiatry;  Laterality: Right;   IRRIGATION AND DEBRIDEMENT FOOT Right 08/09/2020   IRRIGATION AND DEBRIDEMENT OF FOOT WITH IMPLANTATION OF ANTIBIOTIC CEMENT BEADS (Right Foot)   IRRIGATION AND DEBRIDEMENT FOOT Right 08/12/2020   Procedure: Secondary wound closure and removal of antibiotic beads;  Surgeon: Edwin Cap, DPM;  Location: [facility] OR;  Service: Podiatry;  Laterality: Right;   ROTATOR CUFF REPAIR      Social History   Socioeconomic History   Marital status: Married    Spouse name: Not on file   Number of children: Not on file   Years of education: Not on file   Highest education level: Not on file  Occupational History   Not on file  Tobacco Use   Smoking status:  Never   Smokeless tobacco: Never  Vaping Use   Vaping Use: Never used  Substance and Sexual Activity   Alcohol use: Yes    Comment: occasional   Drug use: Never   Sexual activity: Yes  Other Topics Concern   Not on file  Social History Narrative   Not on file   Social Determinants of Health   Financial Resource Strain: Not on file  Food Insecurity: Not on file  Transportation Needs: Not on file  Physical Activity: Not on file  Stress: Not on file  Social Connections: Not on file  Intimate Partner Violence:  Not on file    Family History  Problem Relation Age of Onset   Heart disease Mother    Atrial fibrillation Mother    Diabetes Mother    Bladder Cancer Father    Other Sister        No known health problems   Heart attack Brother    CAD Brother    Diabetes Brother     ROS: no fevers or chills, productive cough, hemoptysis, dysphasia, odynophagia, melena, hematochezia, dysuria, hematuria, rash, seizure activity, orthopnea, PND, pedal edema, claudication. Remaining systems are negative.  Physical Exam: Well-developed well-nourished in no acute distress.  Skin is warm and dry.  HEENT is normal.  Neck is supple.  Chest is clear to auscultation with normal expansion.  Cardiovascular exam is regular rate and rhythm.  Abdominal exam nontender or distended. No masses palpated. Extremities show no edema. neuro grossly intact  ECG-normal sinus rhythm with first-degree AV block, no significant ST changes.  Personally reviewed  A/P  1 paroxysmal atrial fibrillation-patient remains in sinus rhythm on examination.  We will continue Toprol and Xarelto at present dose.  2 coronary calcification-significantly elevated calcium score at time of recent study.  Will reschedule nuclear study to screen for ischemia.  Will not add aspirin in the setting of need for Xarelto.  I again discussed a statin with patient.  However he has declined at this point.  3 hypertension-blood pressure elevated.  I have asked him to track this and we will add additional medications if needed.  4 minimally dilated aortic root-we will likely repeat echocardiogram in the future to reassess.  Olga Millers, MD

## 2022-07-29 ENCOUNTER — Encounter: Payer: Self-pay | Admitting: *Deleted

## 2022-07-29 ENCOUNTER — Encounter: Payer: Self-pay | Admitting: Cardiology

## 2022-07-29 ENCOUNTER — Ambulatory Visit (INDEPENDENT_AMBULATORY_CARE_PROVIDER_SITE_OTHER): Payer: No Typology Code available for payment source | Admitting: Cardiology

## 2022-07-29 VITALS — BP 153/80 | HR 61 | Ht 70.5 in | Wt 195.1 lb

## 2022-07-29 DIAGNOSIS — I2510 Atherosclerotic heart disease of native coronary artery without angina pectoris: Secondary | ICD-10-CM

## 2022-07-29 DIAGNOSIS — I10 Essential (primary) hypertension: Secondary | ICD-10-CM

## 2022-07-29 DIAGNOSIS — I480 Paroxysmal atrial fibrillation: Secondary | ICD-10-CM | POA: Diagnosis not present

## 2022-07-29 NOTE — Patient Instructions (Signed)
  Testing/Procedures:  Your physician has requested that you have a lexiscan myoview. For further information please visit [email or web address] Please follow instruction sheet, as given. [address]   Follow-Up: At [facility], you and your health needs are our priority.  As part of our continuing mission to provide you with exceptional heart care, we have created designated Provider Care Teams.  These Care Teams include your primary Cardiologist (physician) and Advanced Practice Providers (APPs -  Physician Assistants and Nurse Practitioners) who all work together to provide you with the care you need, when you need it.  We recommend signing up for the patient portal called "MyChart".  Sign up information is provided on this After Visit Summary.  MyChart is used to connect with patients for Virtual Visits (Telemedicine).  Patients are able to view lab/test results, encounter notes, upcoming appointments, etc.  Non-urgent messages can be sent to your provider as well.   To learn more about what you can do with MyChart, go to [email or web address].    Your next appointment:   12 month(s)  The format for your next appointment:   In Person  Provider:   Olga Millers, MD

## 2022-07-30 ENCOUNTER — Telehealth (HOSPITAL_COMMUNITY): Payer: Self-pay | Admitting: *Deleted

## 2022-07-30 NOTE — Telephone Encounter (Signed)
Patient given detailed instructions per Myocardial Perfusion Study Information Sheet for the test on  08/05/22 Patient notified to arrive 15 minutes early and that it is imperative to arrive on time for appointment to keep from having the test rescheduled.  If you need to cancel or reschedule your appointment, please call the office within 24 hours of your appointment. . Patient verbalized understanding.  Dean Rasmussen   

## 2022-08-05 ENCOUNTER — Ambulatory Visit (HOSPITAL_COMMUNITY): Payer: No Typology Code available for payment source | Attending: Cardiology

## 2022-08-05 DIAGNOSIS — I2510 Atherosclerotic heart disease of native coronary artery without angina pectoris: Secondary | ICD-10-CM | POA: Insufficient documentation

## 2022-08-05 LAB — MYOCARDIAL PERFUSION IMAGING
LV dias vol: 110 mL (ref 62–150)
LV sys vol: 53 mL
Nuc Stress EF: 52 %
Peak HR: 71 {beats}/min
Rest HR: 65 {beats}/min
Rest Nuclear Isotope Dose: 10.1 mCi
SDS: 2
SRS: 0
SSS: 2
ST Depression (mm): 0 mm
Stress Nuclear Isotope Dose: 30.3 mCi
TID: 1.13

## 2022-08-05 MED ORDER — REGADENOSON 0.4 MG/5ML IV SOLN
0.4000 mg | Freq: Once | INTRAVENOUS | Status: AC
  Administered 2022-08-05: 0.4 mg via INTRAVENOUS

## 2022-08-05 MED ORDER — TECHNETIUM TC 99M TETROFOSMIN IV KIT
10.1000 | PACK | Freq: Once | INTRAVENOUS | Status: AC | PRN
  Administered 2022-08-05: 10.1 via INTRAVENOUS

## 2022-08-05 MED ORDER — TECHNETIUM TC 99M TETROFOSMIN IV KIT
30.3000 | PACK | Freq: Once | INTRAVENOUS | Status: AC | PRN
  Administered 2022-08-05: 30.3 via INTRAVENOUS

## 2022-10-06 ENCOUNTER — Ambulatory Visit (INDEPENDENT_AMBULATORY_CARE_PROVIDER_SITE_OTHER): Payer: No Typology Code available for payment source | Admitting: Podiatry

## 2022-10-06 DIAGNOSIS — E1149 Type 2 diabetes mellitus with other diabetic neurological complication: Secondary | ICD-10-CM | POA: Diagnosis not present

## 2022-10-06 DIAGNOSIS — L84 Corns and callosities: Secondary | ICD-10-CM | POA: Diagnosis not present

## 2022-10-06 DIAGNOSIS — L600 Ingrowing nail: Secondary | ICD-10-CM | POA: Diagnosis not present

## 2022-10-06 NOTE — Progress Notes (Signed)
Subjective: Chief Complaint  Patient presents with   Callouses    Routine foot care, Right callus, patient denies any pain    71 year old male presents the office for diabetic foot evaluation and for recurrent callus on his right foot.  He thinks the callus is getting better.  No swelling redness or drainage.  No open lesions.  He recently cut his toenails before coming to the appointment nosebleeding.  No other concerns.    Last A1c was 9.4 on Apr 22, 2021  Objective: AAO x3, NAD DP/PT pulses palpable bilaterally, CRT less than 3 seconds Mild incurvation of the nails. Sensation decreased with Semmes Weinstein monofilament Mild callus on the previous wound on the right foot. No skin breakdown noted today.  He reports occasional tenderness on the callus as well as with point prominence but no specific area pinpoint tenderness today. See pictures below but on the right foot on the first and fourth toes he is cut the toenails too short and there was some dried blood.  No edema, erythema. No pain with calf compression, swelling, warmth, erythema          Assessment: Preulcerative callus right foot, uncontrolled diabetes with neuropathy  Plan: -All treatment options discussed with the patient including all alternatives, risks, complications.  -I sharply debrided the hyperkeratotic lesion to the any complications or bleeding.  I did recommend moisturizer and offloading daily.  -Antibiotic ointment dressing to the toenails.  Monitor for any signs or symptoms of infection.  Operative do routine debridements of the nails we declined. -Continue supportive shoe gear.  -Daily foot inspection, glucose control  Return in about 3 months (around 01/06/2023).  Trula Slade DPM

## 2022-11-08 ENCOUNTER — Other Ambulatory Visit: Payer: Self-pay | Admitting: Cardiology

## 2022-11-09 NOTE — Telephone Encounter (Signed)
Prescription refill request for Xarelto received.  Indication:afib Last office visit:8/23 Weight:88.5  kg Age:71 Scr:1.2 CrCl:70.68  ml/min  Prescription refilled

## 2023-01-05 ENCOUNTER — Ambulatory Visit (INDEPENDENT_AMBULATORY_CARE_PROVIDER_SITE_OTHER): Payer: No Typology Code available for payment source | Admitting: Podiatry

## 2023-01-05 DIAGNOSIS — E11610 Type 2 diabetes mellitus with diabetic neuropathic arthropathy: Secondary | ICD-10-CM

## 2023-01-05 DIAGNOSIS — L84 Corns and callosities: Secondary | ICD-10-CM | POA: Diagnosis not present

## 2023-01-05 DIAGNOSIS — E1149 Type 2 diabetes mellitus with other diabetic neurological complication: Secondary | ICD-10-CM

## 2023-01-05 NOTE — Progress Notes (Signed)
Subjective: Chief Complaint  Patient presents with   Wound Check    Right foot     72 year old male presents the office for diabetic foot evaluation and for recurrent callus on his right foot.  He has not seen any significant changes.  Requesting new inserts.     Last A1c was 9.4 on Apr 22, 2021  Objective: AAO x3, NAD DP/PT pulses palpable bilaterally, CRT less than 3 seconds Mild incurvation of the nails. Sensation decreased with Semmes Weinstein monofilament Preulcerative hyperkeratotic lesion noted along the previous wound on the right foot. No skin breakdown noted today however there was some dried blood underneath this today.  There is no underlying ulceration noted.  There is no edema, erythema or signs of infection.  There is no open lesions.  No pain with calf compression, swelling, warmth, erythema     Assessment: Preulcerative callus right foot, uncontrolled diabetes with neuropathy  Plan: -All treatment options discussed with the patient including all alternatives, risks, complications.  -I sharply debrided the hyperkeratotic lesion to the any complications or bleeding.  I did recommend moisturizer and offloading daily.  He is to monitor this closely as it is thicker today than it has been previously. -Measured for new orthotics today. -Continue supportive shoe gear.  -Daily foot inspection, glucose control  Return in about 3 months (around 04/05/2023).  Trula Slade DPM   ---  Patient presents today to be casted for custom molded orthotics. Dr. Jacqualyn Posey has been treating patient for pre-ulcerative calluses.   Impression foam cast was taken. ABN signed.  Patient info-  Shoe size: 12 wide men's  Shoe style: Athletic shoes  Weight: 185 lbs  Insurance: [employer]   Patient will be notified once orthotics arrive in office and reappoint for fitting at that time.

## 2023-01-11 ENCOUNTER — Telehealth: Payer: Self-pay | Admitting: General Practice

## 2023-01-11 NOTE — Telephone Encounter (Signed)
Transition Care Management Unsuccessful Follow-up Telephone Call  Date of discharge and from where:  01/09/23 from [facility]  Attempts:  1st Attempt  Reason for unsuccessful TCM follow-up call:  Left voice message

## 2023-01-14 NOTE — Telephone Encounter (Signed)
Transition Care Management Unsuccessful Follow-up Telephone Call  Date of discharge and from where:  01/09/23 from [facility]  Attempts:  2nd Attempt  Reason for unsuccessful TCM follow-up call:  No answer/busy

## 2023-01-15 NOTE — Telephone Encounter (Signed)
Transition Care Management Unsuccessful Follow-up Telephone Call  Date of discharge and from where:  01/09/23 from [facility]  Attempts:  3rd Attempt  Reason for unsuccessful TCM follow-up call:  No answer/busy

## 2023-02-01 IMAGING — DX DG CERVICAL SPINE COMPLETE 4+V
6 series · 6 of 6 positions shown · non-contrast
Comparison: None Available.

CLINICAL DATA: Intermittent right neck/shoulder pain x 3 years.

EXAM:
CERVICAL SPINE - COMPLETE 4+ VIEW

[c-spine lat]
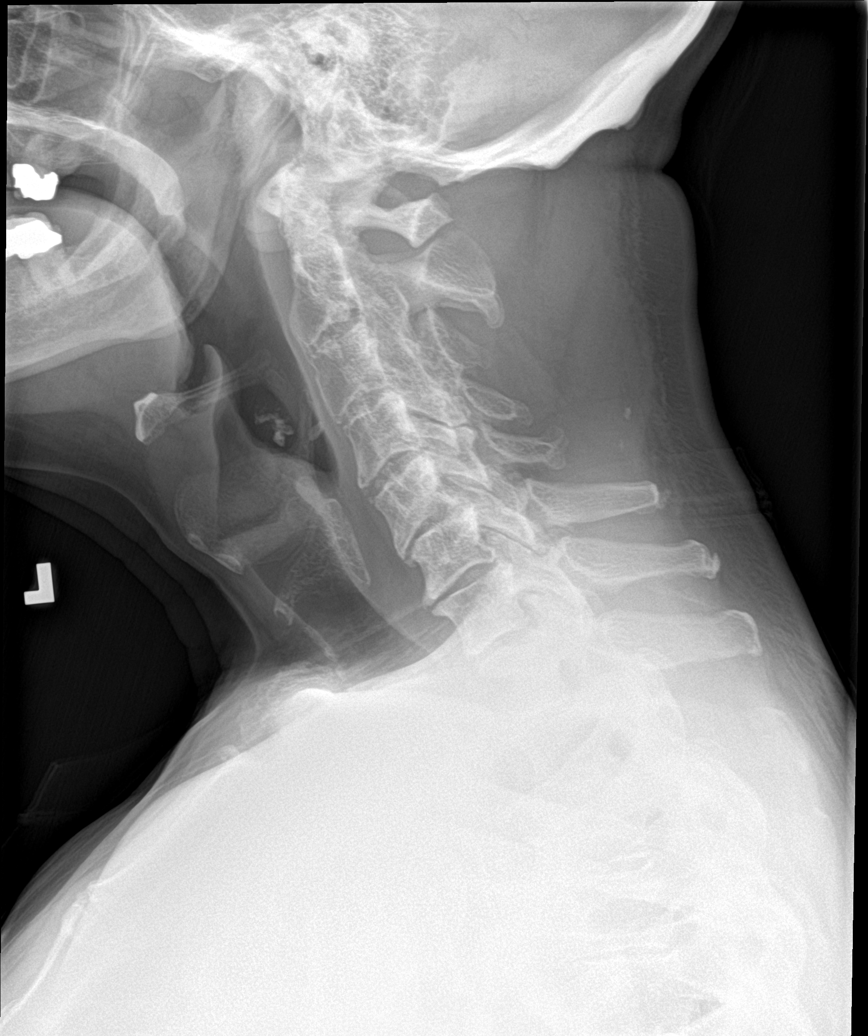

[c-spine obl (1 of 2)]
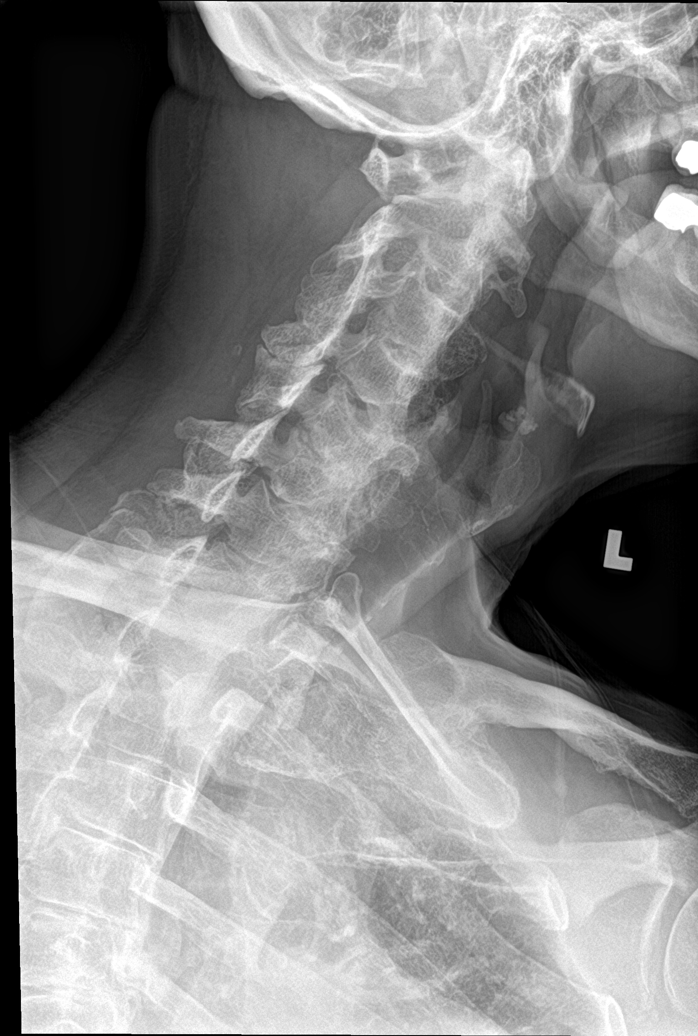

[c-spine obl (2 of 2)]
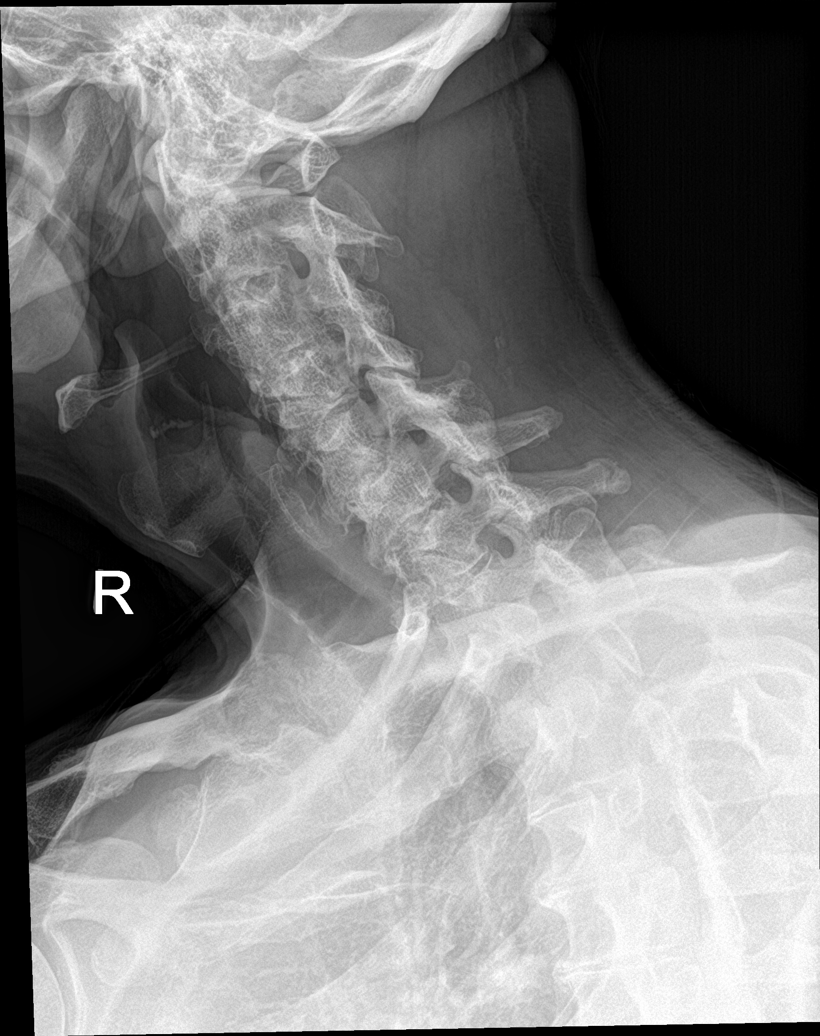

[c-spine ap]
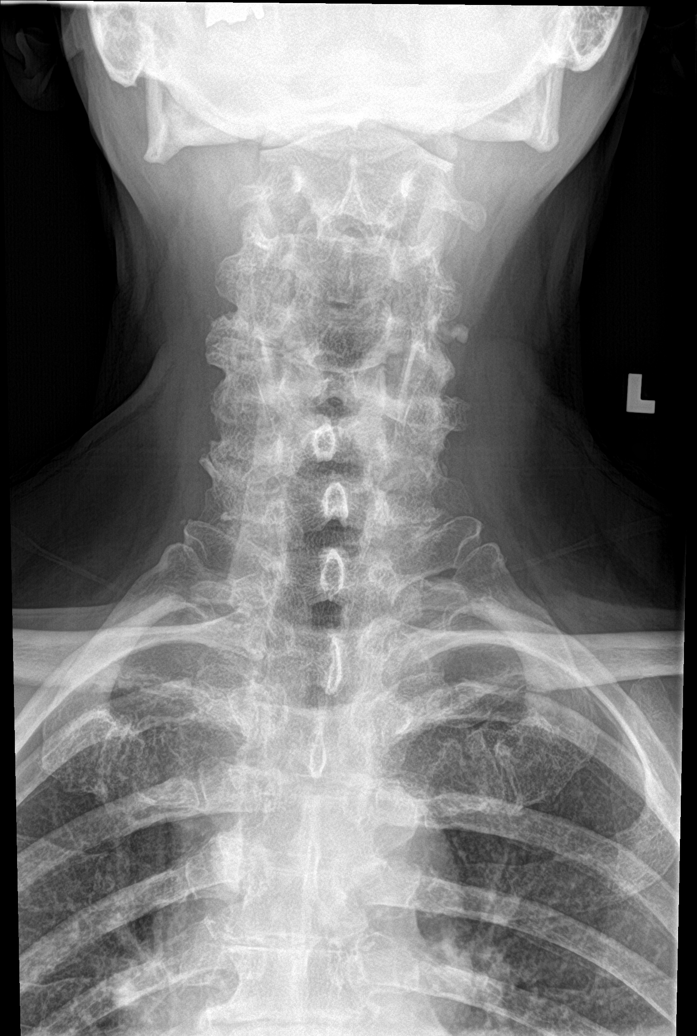

[c-spine open mouth]
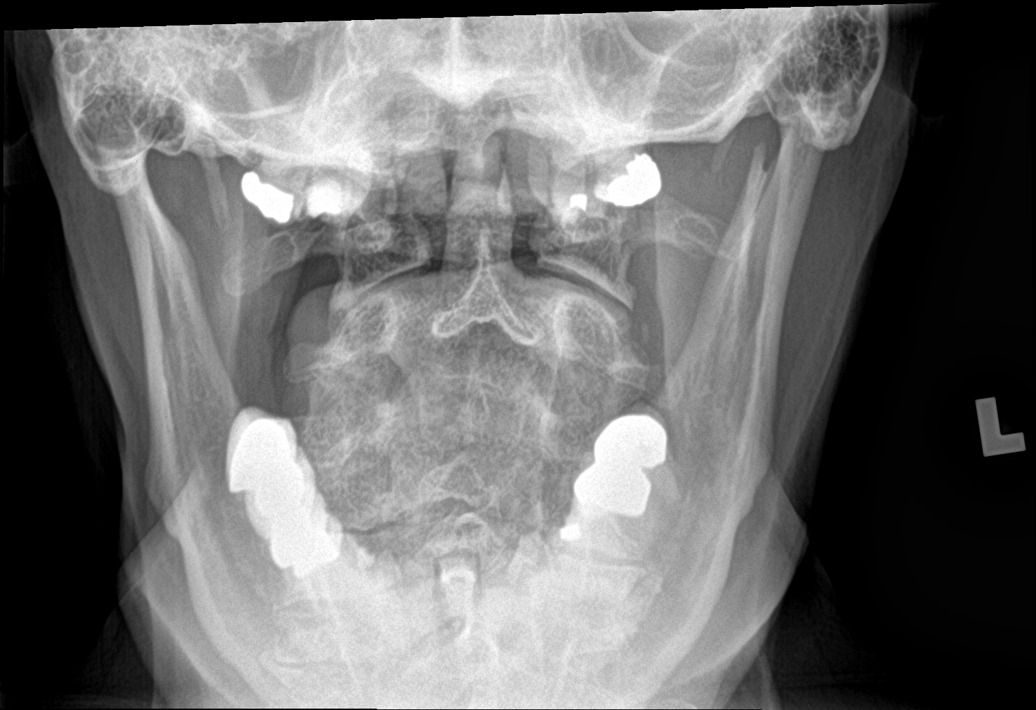

[c-spine swimmers]
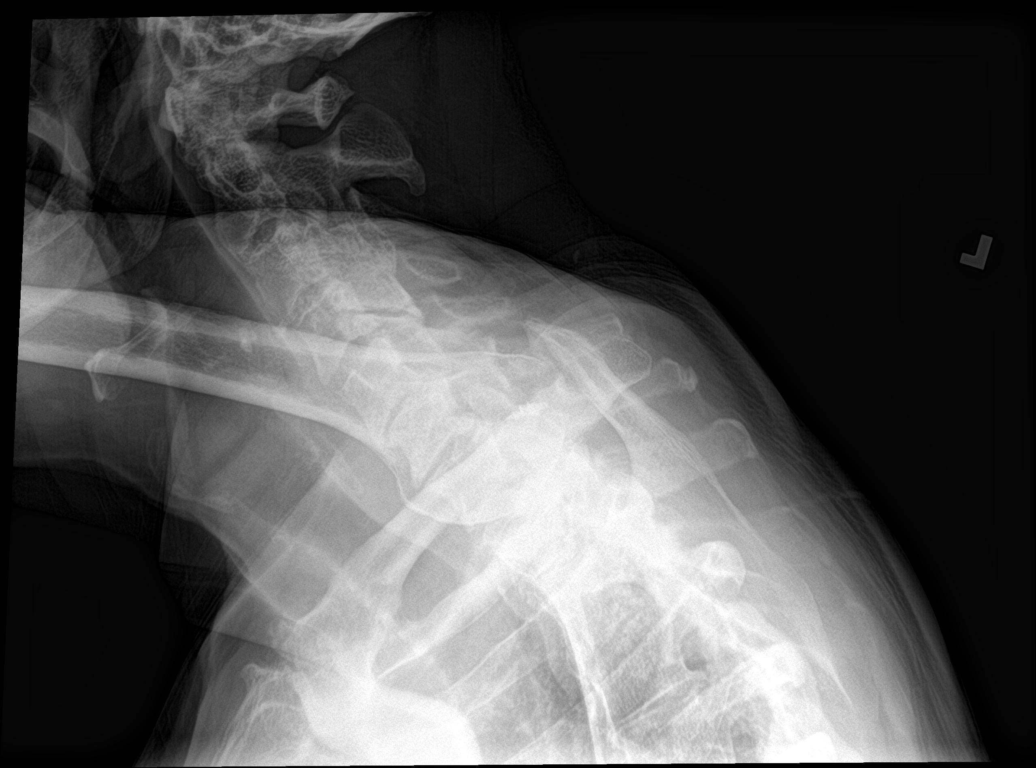

[6 of 6 positions shown; findings below may reference images not displayed]

FINDINGS: There is no evidence of cervical spine fracture or prevertebral soft
tissue swelling. Approximately 3 mm to 4 mm anterolisthesis of the
C5 vertebral body is noted on C6. Multilevel marked severity
endplate sclerosis is noted throughout the cervical spine with
moderate severity multilevel intervertebral disc space narrowing.
Marked severity narrowing of the anterior atlantoaxial articulation
is also seen, as well as the bilateral multilevel facet joint
hypertrophy.
IMPRESSION: Marked severity multilevel degenerative changes.

## 2023-02-16 ENCOUNTER — Other Ambulatory Visit: Payer: Self-pay | Admitting: Cardiology

## 2023-02-22 ENCOUNTER — Encounter: Payer: Self-pay | Admitting: Podiatry

## 2023-02-22 ENCOUNTER — Other Ambulatory Visit: Payer: No Typology Code available for payment source

## 2023-04-05 ENCOUNTER — Ambulatory Visit: Payer: Medicare Other | Admitting: Podiatry

## 2023-04-06 ENCOUNTER — Ambulatory Visit: Payer: Medicare Other | Admitting: Podiatry

## 2023-04-12 ENCOUNTER — Ambulatory Visit (INDEPENDENT_AMBULATORY_CARE_PROVIDER_SITE_OTHER): Payer: No Typology Code available for payment source | Admitting: Podiatry

## 2023-04-12 ENCOUNTER — Encounter: Payer: Self-pay | Admitting: Podiatry

## 2023-04-12 DIAGNOSIS — L84 Corns and callosities: Secondary | ICD-10-CM | POA: Diagnosis not present

## 2023-04-12 DIAGNOSIS — E1149 Type 2 diabetes mellitus with other diabetic neurological complication: Secondary | ICD-10-CM | POA: Diagnosis not present

## 2023-04-12 NOTE — Progress Notes (Signed)
Subjective: Chief Complaint  Patient presents with   Callouses    Trim calluses right     72 year old male presents the office for diabetic foot evaluation and for recurrent callus on his right foot.  He states at times he will pick the callus off himself.  No open lesions.  No drainage or pus.  Last A1c was 10.3 on March 17, 2023  Objective: AAO x3, NAD DP/PT pulses palpable bilaterally, CRT less than 3 seconds Mild incurvation of the nails. Sensation decreased with Semmes Weinstein monofilament Preulcerative hyperkeratotic lesion noted along the previous wound on the right foot. No skin breakdown noted today.  Callus formation present on the medial first MPJ as well as any underlying ulceration.  There is no underlying ulceration noted to the area.  There is no edema, erythema or signs of infection.  There is no open lesions.  No pain with calf compression, swelling, warmth, erythema   Assessment: Preulcerative callus right foot, uncontrolled diabetes with neuropathy  Plan: -All treatment options discussed with the patient including all alternatives, risks, complications.  -I sharply debrided the hyperkeratotic lesion to the any complications or bleeding x2.  -Continue offloading -Continue supportive shoe gear.  -Daily foot inspection, glucose control  Return in about 3 months (around 07/13/2023) for callus.  Vivi Barrack DPM

## 2023-05-11 ENCOUNTER — Other Ambulatory Visit: Payer: Self-pay | Admitting: *Deleted

## 2023-05-11 DIAGNOSIS — I480 Paroxysmal atrial fibrillation: Secondary | ICD-10-CM

## 2023-05-11 MED ORDER — RIVAROXABAN 20 MG PO TABS
20.0000 mg | ORAL_TABLET | Freq: Every day | ORAL | 5 refills | Status: DC
Start: 2023-05-11 — End: 2023-05-13

## 2023-05-11 NOTE — Telephone Encounter (Signed)
Xarelto 20mg  refill request received. Pt is 72 years old, weight-88.5kg, Crea-1.6 on 05/10/23 via [facility] from [facility], last seen by Dr. Jens Som on 07/29/22, Diagnosis-Afib, CrCl-52.72ml/min; Dose is appropriate based on dosing criteria. Will send in refill to requested pharmacy.

## 2023-05-13 ENCOUNTER — Other Ambulatory Visit: Payer: Self-pay

## 2023-05-13 NOTE — Telephone Encounter (Signed)
Received Xarelto refill request via fax from [facility]. Refill was recently refilled by Annie Main, RN on 05/11/23. Called pt to clarify if he needed a new prescription or refill sent to different pharmacy. Pt stated he now goes to cardiologist at [facility] and was recently switched to Eliquis. Pt appreciated the call and stated he does NOT need this refill.

## 2023-07-13 ENCOUNTER — Ambulatory Visit (INDEPENDENT_AMBULATORY_CARE_PROVIDER_SITE_OTHER): Payer: No Typology Code available for payment source | Admitting: Podiatry

## 2023-07-13 DIAGNOSIS — L84 Corns and callosities: Secondary | ICD-10-CM | POA: Diagnosis not present

## 2023-07-13 DIAGNOSIS — B351 Tinea unguium: Secondary | ICD-10-CM | POA: Diagnosis not present

## 2023-07-13 DIAGNOSIS — E1149 Type 2 diabetes mellitus with other diabetic neurological complication: Secondary | ICD-10-CM

## 2023-07-13 MED ORDER — CICLOPIROX 8 % EX SOLN: Freq: Every day | CUTANEOUS | 2 refills | Status: AC

## 2023-07-13 NOTE — Patient Instructions (Addendum)
Diabetes Mellitus and Foot Care Diabetes, also called diabetes mellitus, may cause problems with your feet and legs because of poor blood flow (circulation). Poor circulation may make your skin: Become thinner and drier. Break more easily. Heal more slowly. Peel and crack. You may also have nerve damage (neuropathy). This can cause decreased feeling in your legs and feet. This means that you may not notice minor injuries to your feet that could lead to more serious problems. Finding and treating problems early is the best way to prevent future foot problems. How to care for your feet Foot hygiene  Wash your feet daily with warm water and mild soap. Do not use hot water. Then, pat your feet and the areas between your toes until they are fully dry. Do not soak your feet. This can dry your skin. Trim your toenails straight across. Do not dig under them or around the cuticle. File the edges of your nails with an emery board or nail file. Apply a moisturizing lotion or petroleum jelly to the skin on your feet and to dry, brittle toenails. Use lotion that does not contain alcohol and is unscented. Do not apply lotion between your toes. Shoes and socks Wear clean socks or stockings every day. Make sure they are not too tight. Do not wear knee-high stockings. These may decrease blood flow to your legs. Wear shoes that fit well and have enough cushioning. Always look in your shoes before you put them on to be sure there are no objects inside. To break in new shoes, wear them for just a few hours a day. This prevents injuries on your feet. Wounds, scrapes, corns, and calluses  Check your feet daily for blisters, cuts, bruises, sores, and redness. If you cannot see the bottom of your feet, use a mirror or ask someone for help. Do not cut off corns or calluses or try to remove them with medicine. If you find a minor scrape, cut, or break in the skin on your feet, keep it and the skin around it clean and  dry. You may clean these areas with mild soap and water. Do not clean the area with peroxide, alcohol, or iodine. If you have a wound, scrape, corn, or callus on your foot, look at it several times a day to make sure it is healing and not infected. Check for: Redness, swelling, or pain. Fluid or blood. Warmth. Pus or a bad smell. General tips Do not cross your legs. This may decrease blood flow to your feet. Do not use heating pads or hot water bottles on your feet. They may burn your skin. If you have lost feeling in your feet or legs, you may not know this is happening until it is too late. Protect your feet from hot and cold by wearing shoes, such as at the beach or on hot pavement. Schedule a complete foot exam at least once a year or more often if you have foot problems. Report any cuts, sores, or bruises to your health care provider right away. Where to find more information American Diabetes Association: diabetes.org Association of Diabetes Care & Education Specialists: diabeteseducator.org Contact a health care provider if: You have a condition that increases your risk of infection, and you have any cuts, sores, or bruises on your feet. You have an injury that is not healing. You have redness on your legs or feet. You feel burning or tingling in your legs or feet. You have pain or cramps in your legs  and feet. Your legs or feet are numb. Your feet always feel cold. You have pain around any toenails. Get help right away if: You have a wound, scrape, corn, or callus on your foot and: You have signs of infection. You have a fever. You have a red line going up your leg. This information is not intended to replace advice given to you by your health care provider. Make sure you discuss any questions you have with your health care provider. Document Revised: 05/20/2022 Document Reviewed: 05/20/2022 Elsevier Patient Education  2024 Elsevier Inc.   Ciclopirox Topical Solution What  is this medication? CICLOPIROX (sye kloe PEER ox) treats fungal infections of the nails. It belongs to a group of medications called antifungals. It will not treat infections caused by bacteria or viruses. This medicine may be used for other purposes; ask your health care provider or pharmacist if you have questions. COMMON BRAND NAME(S): Ciclodan Nail Solution, CNL8, Penlac What should I tell my care team before I take this medication? They need to know if you have any of these conditions: Diabetes (high blood sugar) Immune system problems Organ transplant Receiving steroid inhalers, cream, or lotion Seizures Tingling of the fingers or toes or other nerve disorder An unusual or allergic reaction to ciclopirox, other medications, foods, dyes, or preservatives Pregnant or trying to get pregnant Breast-feeding How should I use this medication? This medication is for external use only. Do not take by mouth. Wash your hands before and after use. If you are treating your hands, only wash your hands before use. Do not get it in your eyes. If you do, rinse your eyes with plenty of cool tap water. Use it as directed on the prescription label at the same time every day. Do not use it more often than directed. Use the medication for the full course as directed by your care team, even if you think you are better. Do not stop using it unless your care team tells you to stop it early. Apply a thin film of the medication to the affected area. Talk to your care team about the use of this medication in children. While it may be prescribed for children as young as 12 years for selected conditions, precautions do apply. Overdosage: If you think you have taken too much of this medicine contact a poison control center or emergency room at once. NOTE: This medicine is only for you. Do not share this medicine with others. What if I miss a dose? If you miss a dose, use it as soon as you can. If it is almost time for  your next dose, use only that dose. Do not use double or extra doses. What may interact with this medication? Interactions are not expected. Do not use any other skin products without telling your care team. This list may not describe all possible interactions. Give your health care provider a list of all the medicines, herbs, non-prescription drugs, or dietary supplements you use. Also tell them if you smoke, drink alcohol, or use illegal drugs. Some items may interact with your medicine. What should I watch for while using this medication? Visit your care team for regular checks on your progress. It may be some time before you see the benefit from this medication. Do not use nail polish or other nail cosmetic products on the treated nails. Removal of the unattached, infected nail by your care team is needed with use of this medication. If you have diabetes or numbness in  your fingers or toes, talk to your care team about proper nail care. What side effects may I notice from receiving this medication? Side effects that you should report to your care team as soon as possible: Allergic reactions--skin rash, itching, hives, swelling of the face, lips, tongue, or throat Burning, itching, crusting, or peeling of treated skin Side effects that usually do not require medical attention (report to your care team if they continue or are bothersome): Change in nail shape, thickness, or color Mild skin irritation, redness, or dryness This list may not describe all possible side effects. Call your doctor for medical advice about side effects. You may report side effects to FDA at 1-800-FDA-1088. Where should I keep my medication? Keep out of the reach of children and pets. Store at room temperature between 20 and 25 degrees C (68 and 77 degrees F). This medication is flammable. Avoid exposure to heat, fire, flame, and smoking. Get rid of medications that are no longer needed or have expired: Take the  medication to a medication take-back program. Check with your pharmacy or law enforcement to find a location. If you cannot return the medication, check the label or package insert to see if the medication should be thrown out in the garbage or flushed down the toilet. If you are not sure, ask your care team. If it is safe to put in the trash, take the medication out of the container. Mix the medication with cat litter, dirt, coffee grounds, or other unwanted substance. Seal the mixture in a bag or container. Put it in the trash. NOTE: This sheet is a summary. It may not cover all possible information. If you have questions about this medicine, talk to your doctor, pharmacist, or health care provider.  2024 Elsevier/Gold Standard (2022-03-16 00:00:00)

## 2023-07-13 NOTE — Progress Notes (Signed)
Subjective: No chief complaint on file.   7-47year-old male presents the office for diabetic foot evaluation and for recurrent callus on his right foot.  He states at times he will pick the callus off himself.  No open lesions.  No drainage or pus.  States his right big toenails been yellow.  States the nail previously did come off and this is however back in.  No drainage or pus.  No pain.  Last A1c was 9 on May 10, 2023  Objective: AAO x3, NAD DP/PT pulses palpable bilaterally, CRT less than 3 seconds Mild incurvation of the nails. Sensation decreased with Semmes Weinstein monofilament Preulcerative hyperkeratotic lesion noted along the previous wound on the right foot. No skin breakdown noted today.  Callus formation present on the medial first MPJ as well as any underlying ulceration.  There is no underlying ulceration noted to the area.  There is no edema, erythema or signs of infection.  There is no open lesions.  Right hallux nail is yellow.  No pain.  No swelling redness or any drainage. No pain with calf compression, swelling, warmth, erythema   Assessment: Preulcerative callus right foot, uncontrolled diabetes with neuropathy; myomatosis  Plan: -All treatment options discussed with the patient including all alternatives, risks, complications.  -Sharply debrided the hyperkeratotic lesion to the any complications or bleeding x2.  -Penlac for nail fungus.  Discussed other options as well. -Continue offloading -Continue supportive shoe gear.  -Daily foot inspection, glucose control  Return in about 3 months (around 10/13/2023) for pre-ulcerative callus.  Vivi Barrack DPM

## 2023-10-14 ENCOUNTER — Ambulatory Visit (INDEPENDENT_AMBULATORY_CARE_PROVIDER_SITE_OTHER): Payer: No Typology Code available for payment source | Admitting: Podiatry

## 2023-10-14 DIAGNOSIS — E1149 Type 2 diabetes mellitus with other diabetic neurological complication: Secondary | ICD-10-CM

## 2023-10-14 DIAGNOSIS — L84 Corns and callosities: Secondary | ICD-10-CM | POA: Diagnosis not present

## 2023-10-18 NOTE — Progress Notes (Signed)
Subjective: 72 year-old male presents the office for diabetic foot evaluation and for recurrent callus on his right foot. He has been using moisturizer to the lesion more. No open lesions. He has chronic numbness around the area. No new injures or concerns.   Last A1c was 9 on May 10, 2023  Objective: AAO x3, NAD DP/PT pulses palpable bilaterally, CRT less than 3 seconds Mild incurvation of the nails. Sensation decreased with Semmes Weinstein monofilament Preulcerative hyperkeratotic lesion noted along the previous wound on the right foot. The callus is still present, but not as thick today. No skin breakdown noted today.  Callus formation present on the medial first MPJ as well as any underlying ulceration.  There is no underlying ulceration noted to the area.  There is no edema, erythema or signs of infection.  There is no open lesions.  Right hallux nail is yellow.  No pain.  No swelling redness or any drainage. No pain with calf compression, swelling, warmth, erythema   Assessment: Preulcerative callus right foot, uncontrolled diabetes with neuropathy; myomatosis  Plan: -All treatment options discussed with the patient including all alternatives, risks, complications.  -Sharply debrided the hyperkeratotic lesion to the any complications or bleeding x 2.  -Continue offloading -Continue supportive shoe gear.  -Daily foot inspection, glucose control  Return in about 3 months (around 01/14/2024).  Vivi Barrack DPM

## 2023-11-17 ENCOUNTER — Encounter: Payer: Self-pay | Admitting: Family Medicine

## 2023-11-17 NOTE — Telephone Encounter (Signed)
 Care team updated and letter sent for eye exam notes.

## 2024-01-13 ENCOUNTER — Encounter: Payer: Self-pay | Admitting: Podiatry

## 2024-01-13 ENCOUNTER — Ambulatory Visit (INDEPENDENT_AMBULATORY_CARE_PROVIDER_SITE_OTHER): Payer: No Typology Code available for payment source | Admitting: Podiatry

## 2024-01-13 DIAGNOSIS — L84 Corns and callosities: Secondary | ICD-10-CM | POA: Diagnosis not present

## 2024-01-13 DIAGNOSIS — E1149 Type 2 diabetes mellitus with other diabetic neurological complication: Secondary | ICD-10-CM | POA: Diagnosis not present

## 2024-01-13 NOTE — Progress Notes (Signed)
Subjective: Chief Complaint  Patient presents with   Callouses    RM#13 Right foot callus trimming patient would like to discuss his issue.    73 year-old male presents the office for diabetic foot evaluation and for recurrent callus on his right foot.  States the callus is not building up as much ans asking if he needs to keep coming to the office it frequently.  Denies any drainage or pus or any open lesions.  No new concerns.    Last A1c was 9 on May 10, 2023  Objective: AAO x3, NAD DP/PT pulses palpable bilaterally, CRT less than 3 seconds Mild incurvation of the nails. Sensation decreased with Semmes Weinstein monofilament Preulcerative hyperkeratotic lesion noted along the previous wound on the right foot.  Also noticed prominent as it has been previously still remains.  Also gets a hyperkeratotic lesion submetatarsal 1 and there is no underlying ulceration, drainage or signs of infection either the areas.  Flat foot is noted.  No pain with calf compression, swelling, warmth, erythema  Assessment: Preulcerative callus right foot, uncontrolled diabetes with neuropathy  Plan: -All treatment options discussed with the patient including all alternatives, risks, complications.  -Sharply debrided the hyperkeratotic lesion to the any complications or bleeding x 2.  Continue moisturizer, offloading daily.  Monitoring signs or symptoms of infection or skin breakdown. -Continue supportive shoe gear.  -Daily foot inspection, glucose control  Return in about 4 months (around 05/12/2024).  Vivi Barrack DPM

## 2024-03-08 ENCOUNTER — Other Ambulatory Visit: Payer: Self-pay

## 2024-03-08 MED ORDER — METOPROLOL SUCCINATE ER 100 MG PO TB24: 100.0000 mg | ORAL_TABLET | Freq: Every day | ORAL | 0 refills | Status: DC

## 2024-03-31 ENCOUNTER — Other Ambulatory Visit: Payer: Self-pay | Admitting: Cardiology

## 2024-04-29 ENCOUNTER — Other Ambulatory Visit: Payer: Self-pay | Admitting: Cardiology

## 2024-05-12 ENCOUNTER — Ambulatory Visit (INDEPENDENT_AMBULATORY_CARE_PROVIDER_SITE_OTHER): Payer: Medicare Other | Admitting: Podiatry

## 2024-05-12 DIAGNOSIS — L84 Corns and callosities: Secondary | ICD-10-CM | POA: Diagnosis not present

## 2024-05-12 DIAGNOSIS — E1149 Type 2 diabetes mellitus with other diabetic neurological complication: Secondary | ICD-10-CM

## 2024-05-16 NOTE — Progress Notes (Signed)
 Subjective: Chief Complaint  Patient presents with   RFC    RM#11 RFC right foot only patient states.    73 year-old male presents the office for diabetic foot evaluation and for recurrent callus on his right foot.  States the callus is not building up as much as he tries to follow down at home some.  Denies any open lesions or any swelling or redness or any drainage.  Last A1c was 9 on May 10, 2023  Objective: AAO x3, NAD DP/PT pulses palpable bilaterally, CRT less than 3 seconds Mild incurvation of the nails. Sensation decreased with Semmes Weinstein monofilament There is still a preulcerative hyperkeratotic lesion noted along the previous wound on the right foot.  Also noticed prominent as it has been previously still remains.  Also gets a hyperkeratotic lesion submetatarsal 1 and there is no underlying ulceration, drainage or signs of infection either the areas.  Flat foot is noted.  No pain with calf compression, swelling, warmth, erythema  Assessment: Preulcerative callus right foot, uncontrolled diabetes with neuropathy  Plan: -All treatment options discussed with the patient including all alternatives, risks, complications.  -Sharply debrided the hyperkeratotic lesion to the any complications or bleeding x 2.  Continue moisturizer, offloading daily.  Monitoring signs or symptoms of infection or skin breakdown. -Continue supportive shoe gear.  -Daily foot inspection, glucose control  Return in about 6 months (around 11/11/2024).   Dean Rasmussen DPM

## 2024-08-01 ENCOUNTER — Encounter: Payer: Self-pay | Admitting: Sports Medicine

## 2024-11-10 ENCOUNTER — Ambulatory Visit: Admitting: Podiatry

## 2024-11-10 VITALS — Ht 70.5 in | Wt 195.0 lb

## 2024-11-10 DIAGNOSIS — L84 Corns and callosities: Secondary | ICD-10-CM | POA: Diagnosis not present

## 2024-11-10 DIAGNOSIS — E1149 Type 2 diabetes mellitus with other diabetic neurological complication: Secondary | ICD-10-CM

## 2024-11-13 NOTE — Progress Notes (Signed)
 Subjective:  Chief Complaint  Patient presents with   Nail Problem    RM 12 Patient is here for post-surgical callus trim of right foot.    73 year-old male presents the office for diabetic foot evaluation and for recurrent callus on his right foot.  He does not report any ulcerations.  Denies any drainage.  He states that he does get some discomfort in the area the calluses at times but not on a consistent basis.  Last A1c was 9 on May 10, 2023  Objective: AAO x3, NAD DP/PT pulses palpable bilaterally, CRT less than 3 seconds Mild incurvation of the nails. Sensation decreased with Semmes Weinstein monofilament There is still a preulcerative hyperkeratotic lesion noted along the previous wound on the right foot.  Secondary lesion adjacent to this along the plantar aspect of the foot.  This is along the prominence along the navicular tuberosity area.  There is no underlying ulceration, drainage or any signs of infection.  There are no open lesions identified at this time.  No significant pain on exam.  Flat foot is noted.  No pain with calf compression, swelling, warmth, erythema  Assessment: Preulcerative callus right foot, uncontrolled diabetes with neuropathy  Plan: -All treatment options discussed with the patient including all alternatives, risks, complications.  -Sharply debrided the hyperkeratotic lesion to the any complications or bleeding x 2.  Continue moisturizer, offloading daily.  Monitoring signs or symptoms of infection or skin breakdown. -Continue supportive shoe gear.  -Daily foot inspection, glucose control  Return in about 6 months (around 05/11/2025).  Donnice JONELLE Fees DPM

## 2025-05-11 ENCOUNTER — Ambulatory Visit: Admitting: Podiatry
